# Patient Record
Sex: Male | Born: 1945 | Race: White | Hispanic: No | Marital: Married | Smoking: Former smoker
Health system: Southern US, Community
[De-identification: ages and names within clinical notes are randomized; demographics above are authoritative.]

## PROBLEM LIST (undated history)

## (undated) DIAGNOSIS — F4310 Post-traumatic stress disorder, unspecified: Secondary | ICD-10-CM

## (undated) DIAGNOSIS — F410 Panic disorder [episodic paroxysmal anxiety] without agoraphobia: Secondary | ICD-10-CM

## (undated) DIAGNOSIS — N189 Chronic kidney disease, unspecified: Secondary | ICD-10-CM

## (undated) DIAGNOSIS — F32A Depression, unspecified: Secondary | ICD-10-CM

## (undated) DIAGNOSIS — C4491 Basal cell carcinoma of skin, unspecified: Secondary | ICD-10-CM

## (undated) DIAGNOSIS — M1990 Unspecified osteoarthritis, unspecified site: Secondary | ICD-10-CM

## (undated) DIAGNOSIS — Z87442 Personal history of urinary calculi: Secondary | ICD-10-CM

## (undated) DIAGNOSIS — H903 Sensorineural hearing loss, bilateral: Secondary | ICD-10-CM

## (undated) DIAGNOSIS — R002 Palpitations: Secondary | ICD-10-CM

## (undated) DIAGNOSIS — Z8719 Personal history of other diseases of the digestive system: Secondary | ICD-10-CM

## (undated) DIAGNOSIS — H8111 Benign paroxysmal vertigo, right ear: Secondary | ICD-10-CM

## (undated) DIAGNOSIS — Z8739 Personal history of other diseases of the musculoskeletal system and connective tissue: Secondary | ICD-10-CM

## (undated) DIAGNOSIS — F329 Major depressive disorder, single episode, unspecified: Secondary | ICD-10-CM

## (undated) DIAGNOSIS — G4700 Insomnia, unspecified: Secondary | ICD-10-CM

## (undated) DIAGNOSIS — I10 Essential (primary) hypertension: Secondary | ICD-10-CM

## (undated) DIAGNOSIS — G9689 Other specified disorders of central nervous system: Secondary | ICD-10-CM

## (undated) DIAGNOSIS — Z9289 Personal history of other medical treatment: Secondary | ICD-10-CM

## (undated) DIAGNOSIS — G968 Other specified disorders of central nervous system: Secondary | ICD-10-CM

## (undated) DIAGNOSIS — K219 Gastro-esophageal reflux disease without esophagitis: Secondary | ICD-10-CM

## (undated) DIAGNOSIS — H269 Unspecified cataract: Secondary | ICD-10-CM

## (undated) DIAGNOSIS — C801 Malignant (primary) neoplasm, unspecified: Secondary | ICD-10-CM

## (undated) HISTORY — PX: COSMETIC SURGERY: SHX468

## (undated) HISTORY — DX: Insomnia, unspecified: G47.00

## (undated) HISTORY — PX: TOTAL KNEE ARTHROPLASTY: SHX125

## (undated) HISTORY — DX: Palpitations: R00.2

## (undated) HISTORY — PX: BACK SURGERY: SHX140

## (undated) HISTORY — DX: Gastro-esophageal reflux disease without esophagitis: K21.9

## (undated) HISTORY — PX: OTHER SURGICAL HISTORY: SHX169

## (undated) HISTORY — PX: JOINT REPLACEMENT: SHX530

## (undated) HISTORY — DX: Unspecified cataract: H26.9

## (undated) HISTORY — DX: Other specified disorders of central nervous system: G96.8

## (undated) HISTORY — DX: Post-traumatic stress disorder, unspecified: F43.10

## (undated) HISTORY — PX: TONSILLECTOMY: SUR1361

## (undated) HISTORY — DX: Other specified disorders of central nervous system: G96.89

## (undated) HISTORY — DX: Malignant (primary) neoplasm, unspecified: C80.1

## (undated) HISTORY — DX: Basal cell carcinoma of skin, unspecified: C44.91

## (undated) HISTORY — PX: HERNIA REPAIR: SHX51

---

## 1995-06-23 HISTORY — PX: ESOPHAGOGASTRODUODENOSCOPY: SHX1529

## 1997-10-12 ENCOUNTER — Ambulatory Visit (HOSPITAL_COMMUNITY): Admission: RE | Admit: 1997-10-12 | Discharge: 1997-10-12 | Payer: Self-pay | Admitting: Urology

## 1997-10-20 ENCOUNTER — Ambulatory Visit (HOSPITAL_COMMUNITY): Admission: RE | Admit: 1997-10-20 | Discharge: 1997-10-20 | Payer: Self-pay | Admitting: Urology

## 1998-05-22 HISTORY — PX: NEUROMA SURGERY: SHX722

## 1998-07-05 ENCOUNTER — Ambulatory Visit (HOSPITAL_BASED_OUTPATIENT_CLINIC_OR_DEPARTMENT_OTHER): Admission: RE | Admit: 1998-07-05 | Discharge: 1998-07-05 | Payer: Self-pay | Admitting: Orthopedic Surgery

## 1998-09-24 ENCOUNTER — Encounter: Payer: Self-pay | Admitting: Family Medicine

## 1998-09-24 LAB — CONVERTED CEMR LAB: Blood Glucose, Fasting: 99 mg/dL

## 1998-10-14 ENCOUNTER — Encounter: Payer: Self-pay | Admitting: Family Medicine

## 1998-10-14 LAB — CONVERTED CEMR LAB
RBC count: 5.47 10*6/uL
WBC, blood: 4.3 10*3/uL

## 1999-11-18 ENCOUNTER — Encounter: Payer: Self-pay | Admitting: Family Medicine

## 1999-11-18 LAB — CONVERTED CEMR LAB
Blood Glucose, Fasting: 92 mg/dL
PSA: 2.5 ng/mL
RBC count: 5.79 10*6/uL
WBC, blood: 4.5 10*3/uL

## 2000-10-20 HISTORY — PX: KNEE ARTHROSCOPY: SUR90

## 2000-12-20 ENCOUNTER — Encounter: Payer: Self-pay | Admitting: Family Medicine

## 2001-12-30 ENCOUNTER — Encounter: Payer: Self-pay | Admitting: Family Medicine

## 2002-06-03 ENCOUNTER — Encounter: Payer: Self-pay | Admitting: Family Medicine

## 2002-06-03 LAB — CONVERTED CEMR LAB
RBC count: 5.83 10*6/uL
WBC, blood: 6.8 10*3/uL

## 2003-05-21 ENCOUNTER — Encounter: Payer: Self-pay | Admitting: Family Medicine

## 2003-05-21 LAB — CONVERTED CEMR LAB
Blood Glucose, Fasting: 93 mg/dL
PSA: 0.9 ng/mL
RBC count: 5.34 10*6/uL
TSH: 3.15 microintl units/mL
WBC, blood: 4.2 10*3/uL

## 2004-04-29 ENCOUNTER — Ambulatory Visit: Payer: Self-pay | Admitting: Family Medicine

## 2004-05-04 ENCOUNTER — Encounter: Payer: Self-pay | Admitting: Family Medicine

## 2004-05-19 ENCOUNTER — Ambulatory Visit: Payer: Self-pay | Admitting: Family Medicine

## 2005-09-28 ENCOUNTER — Ambulatory Visit: Payer: Self-pay | Admitting: Family Medicine

## 2006-01-04 ENCOUNTER — Encounter: Payer: Self-pay | Admitting: Family Medicine

## 2006-01-04 LAB — CONVERTED CEMR LAB: PSA: 1.1 ng/mL

## 2006-01-10 ENCOUNTER — Ambulatory Visit: Payer: Self-pay | Admitting: Family Medicine

## 2006-02-05 ENCOUNTER — Ambulatory Visit: Payer: Self-pay | Admitting: Family Medicine

## 2006-04-10 ENCOUNTER — Ambulatory Visit: Payer: Self-pay | Admitting: Family Medicine

## 2006-05-01 ENCOUNTER — Encounter: Payer: Self-pay | Admitting: Family Medicine

## 2006-05-10 ENCOUNTER — Ambulatory Visit: Payer: Self-pay | Admitting: Family Medicine

## 2006-10-10 ENCOUNTER — Telehealth (INDEPENDENT_AMBULATORY_CARE_PROVIDER_SITE_OTHER): Payer: Self-pay | Admitting: *Deleted

## 2007-10-03 ENCOUNTER — Encounter: Payer: Self-pay | Admitting: Family Medicine

## 2007-10-03 ENCOUNTER — Ambulatory Visit: Payer: Self-pay | Admitting: Family Medicine

## 2007-10-03 DIAGNOSIS — I10 Essential (primary) hypertension: Secondary | ICD-10-CM

## 2007-10-03 DIAGNOSIS — K219 Gastro-esophageal reflux disease without esophagitis: Secondary | ICD-10-CM

## 2007-10-03 DIAGNOSIS — C44309 Unspecified malignant neoplasm of skin of other parts of face: Secondary | ICD-10-CM | POA: Insufficient documentation

## 2007-10-03 DIAGNOSIS — M25569 Pain in unspecified knee: Secondary | ICD-10-CM | POA: Insufficient documentation

## 2007-10-03 DIAGNOSIS — C44300 Unspecified malignant neoplasm of skin of unspecified part of face: Secondary | ICD-10-CM | POA: Insufficient documentation

## 2007-10-03 DIAGNOSIS — E781 Pure hyperglyceridemia: Secondary | ICD-10-CM | POA: Insufficient documentation

## 2007-10-21 ENCOUNTER — Encounter: Payer: Self-pay | Admitting: Family Medicine

## 2008-03-03 ENCOUNTER — Telehealth: Payer: Self-pay | Admitting: Family Medicine

## 2008-03-09 LAB — CONVERTED CEMR LAB
BUN: 16 mg/dL
Creatinine, Ser: 1.44 mg/dL

## 2008-03-10 ENCOUNTER — Encounter: Payer: Self-pay | Admitting: Family Medicine

## 2008-04-02 ENCOUNTER — Ambulatory Visit: Payer: Self-pay | Admitting: Family Medicine

## 2008-04-02 DIAGNOSIS — N529 Male erectile dysfunction, unspecified: Secondary | ICD-10-CM | POA: Insufficient documentation

## 2008-04-24 ENCOUNTER — Ambulatory Visit: Payer: Self-pay | Admitting: Family Medicine

## 2008-04-24 LAB — CONVERTED CEMR LAB: OCCULT 2: NEGATIVE

## 2008-04-27 ENCOUNTER — Encounter (INDEPENDENT_AMBULATORY_CARE_PROVIDER_SITE_OTHER): Payer: Self-pay | Admitting: *Deleted

## 2008-10-01 ENCOUNTER — Ambulatory Visit: Payer: Self-pay | Admitting: Family Medicine

## 2008-10-01 DIAGNOSIS — C4490 Unspecified malignant neoplasm of skin, unspecified: Secondary | ICD-10-CM

## 2008-11-02 ENCOUNTER — Encounter: Payer: Self-pay | Admitting: Family Medicine

## 2008-11-02 ENCOUNTER — Ambulatory Visit: Payer: Self-pay | Admitting: Family Medicine

## 2008-11-12 ENCOUNTER — Ambulatory Visit: Payer: Self-pay | Admitting: Family Medicine

## 2009-01-01 ENCOUNTER — Ambulatory Visit: Payer: Self-pay | Admitting: Family Medicine

## 2009-01-01 DIAGNOSIS — M546 Pain in thoracic spine: Secondary | ICD-10-CM

## 2009-01-01 DIAGNOSIS — M545 Low back pain: Secondary | ICD-10-CM | POA: Insufficient documentation

## 2009-01-15 ENCOUNTER — Encounter (INDEPENDENT_AMBULATORY_CARE_PROVIDER_SITE_OTHER): Payer: Self-pay | Admitting: Internal Medicine

## 2009-01-15 ENCOUNTER — Ambulatory Visit: Payer: Self-pay | Admitting: Family Medicine

## 2009-01-21 ENCOUNTER — Encounter (INDEPENDENT_AMBULATORY_CARE_PROVIDER_SITE_OTHER): Payer: Self-pay | Admitting: Internal Medicine

## 2009-06-07 ENCOUNTER — Encounter: Payer: Self-pay | Admitting: Family Medicine

## 2009-06-07 ENCOUNTER — Emergency Department: Payer: Self-pay | Admitting: Internal Medicine

## 2009-06-07 ENCOUNTER — Encounter: Payer: Self-pay | Admitting: Cardiovascular Disease

## 2009-06-07 ENCOUNTER — Telehealth: Payer: Self-pay | Admitting: Family Medicine

## 2009-06-10 ENCOUNTER — Ambulatory Visit: Payer: Self-pay | Admitting: Family Medicine

## 2009-06-10 DIAGNOSIS — R002 Palpitations: Secondary | ICD-10-CM

## 2009-07-15 ENCOUNTER — Ambulatory Visit: Payer: Self-pay | Admitting: Family Medicine

## 2009-07-19 ENCOUNTER — Encounter: Payer: Self-pay | Admitting: Family Medicine

## 2009-07-19 LAB — CONVERTED CEMR LAB: BUN: 21 mg/dL

## 2009-07-21 ENCOUNTER — Telehealth: Payer: Self-pay | Admitting: Family Medicine

## 2009-08-23 ENCOUNTER — Ambulatory Visit: Payer: Self-pay | Admitting: Cardiovascular Disease

## 2009-08-30 ENCOUNTER — Telehealth: Payer: Self-pay | Admitting: Family Medicine

## 2009-08-31 ENCOUNTER — Encounter: Payer: Self-pay | Admitting: Family Medicine

## 2009-09-02 ENCOUNTER — Ambulatory Visit: Payer: Self-pay | Admitting: Family Medicine

## 2009-09-02 ENCOUNTER — Telehealth: Payer: Self-pay | Admitting: Family Medicine

## 2009-09-16 ENCOUNTER — Ambulatory Visit: Payer: Self-pay | Admitting: Family Medicine

## 2009-09-17 ENCOUNTER — Telehealth: Payer: Self-pay | Admitting: Family Medicine

## 2009-09-20 ENCOUNTER — Telehealth: Payer: Self-pay | Admitting: Family Medicine

## 2009-09-27 ENCOUNTER — Ambulatory Visit: Payer: Self-pay | Admitting: Family Medicine

## 2009-09-27 LAB — CONVERTED CEMR LAB
OCCULT 2: NEGATIVE
OCCULT 3: NEGATIVE

## 2009-09-27 LAB — FECAL OCCULT BLOOD, GUAIAC: Fecal Occult Blood: NEGATIVE

## 2009-09-28 ENCOUNTER — Encounter (INDEPENDENT_AMBULATORY_CARE_PROVIDER_SITE_OTHER): Payer: Self-pay | Admitting: *Deleted

## 2009-11-01 ENCOUNTER — Ambulatory Visit: Payer: Self-pay | Admitting: Family Medicine

## 2009-12-28 ENCOUNTER — Encounter (INDEPENDENT_AMBULATORY_CARE_PROVIDER_SITE_OTHER): Payer: Self-pay | Admitting: *Deleted

## 2010-02-24 ENCOUNTER — Ambulatory Visit: Payer: Self-pay | Admitting: Family Medicine

## 2010-02-25 LAB — CONVERTED CEMR LAB
BUN: 14 mg/dL
Creatinine, Ser: 1.6 mg/dL

## 2010-03-02 LAB — CONVERTED CEMR LAB
Chloride: 103 meq/L (ref 96–112)
Creatinine, Ser: 1.6 mg/dL — ABNORMAL HIGH (ref 0.4–1.5)

## 2010-03-17 ENCOUNTER — Ambulatory Visit: Payer: Self-pay | Admitting: Family Medicine

## 2010-03-17 DIAGNOSIS — R7309 Other abnormal glucose: Secondary | ICD-10-CM | POA: Insufficient documentation

## 2010-03-31 ENCOUNTER — Ambulatory Visit: Payer: Self-pay | Admitting: Family Medicine

## 2010-06-02 ENCOUNTER — Ambulatory Visit
Admission: RE | Admit: 2010-06-02 | Discharge: 2010-06-02 | Payer: Self-pay | Source: Home / Self Care | Attending: Family Medicine | Admitting: Family Medicine

## 2010-06-21 NOTE — Progress Notes (Signed)
Summary: heart irregularity and sob  Phone Note Call from Patient   Summary of Call: Patient called and said his heart is beating excessily patient also having shortness of breathe patient advised to go to emergency room patient declined. Spoke with Dr. Milinda Antis she said patient should also go to emergency room. Dee Cma also advised to do the same Initial call taken by: Benny Lennert CMA Duncan Dull),  June 07, 2009 7:52 AM  Follow-up for Phone Call        spoke with patient also and pt states "right now" he doesn't have SOB but it comes and goes, patient is concerned about the irregular heart beat. Heather and I spoke with Dr. Milinda Antis and she also advised that pt go to the ER. Patient stated he would go. DeShannon Langhorne CMA Duncan Dull)  June 07, 2009 7:55 AM   I adv to go to ER if short of breath - otherwise f/u Follow-up by: Judith Part MD,  June 07, 2009 8:39 AM

## 2010-06-21 NOTE — Progress Notes (Signed)
Summary: regarding verapamil refill  Phone Note Refill Request Message from:  Fax from Pharmacy  Refills Requested: Medication #1:  VERAPAMIL HCL 80 MG TABS 1 by mouth three times a day Refill request from [facility].  Request is for 120 mg, one three times a day.  Chart says he takes 90 mg's three times a day, please advise on what he should be taking.  Initial call taken by: Lowella Petties CMA,  September 17, 2009 11:40 AM  Follow-up for Phone Call        Cardiology increased his dose. Follow-up by: Shaune Leeks MD,  Sep 20, 2009 7:25 AM    New/Updated Medications: VERAPAMIL HCL 120 MG TABS (VERAPAMIL HCL) one tab by mouth three times a day Prescriptions: VERAPAMIL HCL 120 MG TABS (VERAPAMIL HCL) one tab by mouth three times a day  #90 x 12   Entered and Authorized by:   Shaune Leeks MD   Signed by:   Shaune Leeks MD on 09/20/2009   Method used:   Electronically to        [facility] [phone]* (retail)       [address] [place], [place]  [address]       Ph: [phone]       Fax: [phone]   RxID:   [id number]

## 2010-06-21 NOTE — Letter (Signed)
Summary: Results Follow up Letter  [place] at [facility]  [address] [place], [place] [address]   Phone: [phone]  Fax: [phone]    09/28/2009 MRN: [id number]  THERIN VETSCH [address] [place], [place]  [address]  Dear Mr. Stillion,  The following are the results of your recent test(s):  Test         Result    Pap Smear:        Normal _____  Not Normal _____ Comments: ______________________________________________________ Cholesterol: LDL(Bad cholesterol):         Your goal is less than:         HDL (Good cholesterol):       Your goal is more than: Comments:  ______________________________________________________ Mammogram:        Normal _____  Not Normal _____ Comments:  ___________________________________________________________________ Hemoccult:        Normal _X____  Not normal _______ Comments:  Please repeat in one year.  _____________________________________________________________________ Other Tests:    We routinely do not discuss normal results over the telephone.  If you desire a copy of the results, or you have any questions about this information we can discuss them at your next office visit.   Sincerely,     Laurita Quint, MD

## 2010-06-21 NOTE — Letter (Signed)
Summary: Nadara Eaton letter  [place] at [facility]  [address] [place], [place] [address]   Phone: [phone]  Fax: [phone]       12/28/2009 MRN: [id number]  LUNDEN MCLEISH [address] [place], [place]  [address]  Dear Mr. [facility] [place] [place] announce the retirement of Arta Silence, M.D., from full-time practice at the [facility] office effective November 18, 2009 and his plans of returning part-time.  It is important to Dr. Hetty Ely and to our practice that you understand that [facility] has seven physicians in our office for your health care needs.  We will continue to offer the same exceptional care that you have today.    Dr. Hetty Ely has spoken to many of you about his plans for retirement and returning part-time in the fall.   We will continue to work with you through the transition to schedule appointments for you in the office and meet the high standards that [facility] is committed to.   Again, it is with great pleasure that we share the news that Dr. Hetty Ely will return to [facility] in October of 2011 with a reduced schedule.    If you have any questions, or would like to request an appointment with one of our physicians, please call us at [phone] and press the option for Scheduling an appointment.  We take pleasure in providing you with excellent patient care and look forward to seeing you at your next office visit.  Our [facility] Physicians are:  Tillman Abide, M.D. Laurita Quint, M.D. Roxy Manns, M.D. Kerby Nora, M.D. Hannah Beat, M.D. Ruthe Mannan, M.D. We proudly welcomed Raechel Ache, M.D. and Eustaquio Boyden, M.D. to the practice in July/August 2011.  Sincerely,  [facility]

## 2010-06-21 NOTE — Progress Notes (Signed)
Summary: need rx for blood work  Phone Note Call from Patient Call back at [phone]   Caller: Patient Call For: Shaune Leeks MD Summary of Call: Patient has a cpx with you on Thursday, he needs a rx for blood work to take to [facility]. Please call patient when ready.  Initial call taken by: Melody Comas,  August 30, 2009 9:29 AM  Follow-up for Phone Call        Done. Follow-up by: Shaune Leeks MD,  August 30, 2009 3:59 PM  Additional Follow-up for Phone Call Additional follow up Details #1::        Patient notified that rx for lab work is ready for pick up.  Additional Follow-up by: Melody Comas,  August 30, 2009 4:10 PM

## 2010-06-21 NOTE — Miscellaneous (Signed)
Summary: CONSENT TO SPECIAL PROCEDURE  CONSENT TO SPECIAL PROCEDURE   Imported By: Carin Primrose 09/17/2009 15:31:07  _____________________________________________________________________  External Attachment:    Type:   Image     Comment:   External Document

## 2010-06-21 NOTE — Progress Notes (Signed)
Summary: PHI  PHI   Imported By: Harlon Flor 08/24/2009 08:59:22  _____________________________________________________________________  External Attachment:    Type:   Image     Comment:   External Document

## 2010-06-21 NOTE — Assessment & Plan Note (Signed)
Summary: CPX.David KitchenMarland KitchenLIFE TIME CPX PER MEDICARE CYD   Vital Signs:  Patient profile:   65 year old male Weight:      246.25 pounds Temp:     98.6 degrees F oral Pulse rate:   60 / minute Pulse rhythm:   regular BP sitting:   144 / 88  (left arm) Cuff size:   large  Vitals Entered By: Sydell Axon LPN (09/23/09 11:03 AM) CC: 30 Minute checkup, hemoccult cards given to patient   History of Present Illness: Pt here for Comp Exam. He has been seen for palpitations which are better since increasing Verapamil. He has significant problems with his knees and legs and can't exercise.  The area of his left flank has been hurting for over a month. He also has left sided suprapubic area discomfort, some milder right sided discomfort. No pain with urination.  Preventive Screening-Counseling & Management  Alcohol-Tobacco     Smoking Status: quit     Year Quit: 1971     Pack years: 25     Passive Smoke Exposure: no  Caffeine-Diet-Exercise     Caffeine use/day: 1     Does Patient Exercise: no  Problems Prior to Update: 1)  Palpitations  (ICD-785.1) 2)  Back Pain, Thoracic Region, Left  (ICD-724.1) 3)  Low Back Pain, Acute  (ICD-724.2) 4)  Carcinoma, Basal Cell  (ICD-173.9) 5)  Special Screening Malig Neoplasms Other Sites  (ICD-V76.49) 6)  Health Maintenance Exam  (ICD-V70.0) 7)  Organic Impotence  (ICD-607.84) 8)  Basal Cell Carcinoma, Nose  (ICD-173.3) 9)  Hypertriglyceridemia  (ICD-272.1) 10)  Gerd  (ICD-530.81) 11)  Hypertension, Essential  (ICD-401.9) 12)  Knee Pain, Right  (ICD-719.46)  Allergies: 1)  ! * Zithromax  Past History:  Past Surgical History: Last updated: 04/02/2008 R HERNOIORRAPHY YEARS AGO R FOOT MORTON'S NEUROMA EXCISION ABD. ULTRASOUND : NO A.A.A.// RIGHT CYST OF KIDNEY// LEFT KIDNEY SMALL:(11/10/1998) R KNEE ARTHROSCOPY (DR MILLER)(10/2000) ABD. ULTRASOUND : NORMAL :(06/06/2002) EGD-- STRETCHING (DR. ELLIOTT) :(1990"S) EGD DILITATION BX  NEGATIVE:(DR.SEIGAL) :(06/1995) RIGHT KNEE ARTHOSCOPY: (10/2000)  Family History: Last updated: 2009/09/23 Father: DECEASED AT 68 YOA : + HTN, +KIDNEY FAILURE Mother: DECEASED AT 16 YOA +HTN; +DM BROTHER A 75 (:RON Derasmo ) DM HTN CHOL ABD Surgery SISTER A 89  DM Obese CABG x 4 CV: NEGATIVE HBP: + SELF; +MOTHER: + FATHER, + BROTHER DM: + MOTHER; + BROTHER GOUT/ARTHRITIS:  PROSTATE CANCER:--- SKIN CANCER: +SELF BREAST/OVARIAN/UTERINE CANCER: COLON CANCER: DEPRESSION: + ? 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 BROTHER ETOH/DRUG ABUSE: NEGATIVE OTHER ;+STROKE BROTHER  Social History: Reviewed history from 10/03/2007 and no changes required. 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 Eyes:  Denies blurring, discharge, eye pain, and itching. ENT:  Denies decreased hearing, ear discharge, and earache. CV:  Complains of palpitations; denies chest pain or discomfort, fainting, fatigue, shortness of breath with exertion, and swelling of feet;  none recently.David Mcgee Resp:  Denies cough, shortness of breath, and wheezing. GI:  Denies abdominal pain, bloody stools, change in bowel habits, constipation, dark tarry stools, diarrhea, indigestion, loss of appetite, nausea, vomiting, vomiting blood, and yellowish skin color. GU:  Denies discharge, dysuria, hematuria, nocturia, and urinary frequency. MS:  Complains of joint pain, low back pain, and muscle aches; denies stiffness. Derm:  Denies dryness, itching, and rash. Neuro:  Denies numbness, poor balance, tingling, and tremors.  Physical Exam  General:  alert, well-developed, well-nourished, and well-hydrated.  NAD Head:  normocephalic, atraumatic, and no abnormalities observed.  Sinuses NT. Eyes:  Conjunctiva clear bilaterally.  Ears:  External ear exam shows no significant lesions or deformities.  Otoscopic examination reveals clear canals, tympanic membranes are intact bilaterally without bulging, retraction, inflammation or discharge. Hearing is grossly normal bilaterally. Nose:  External nasal examination shows no deformity or inflammation. Nasal mucosa are pink and moist without lesions or exudates. Mouth:  Oral mucosa and oropharynx without lesions or exudates.  Teeth in good repair. Neck:  No deformities, masses, or tenderness noted. Chest Wall:  No deformities, masses, tenderness or gynecomastia noted. Breasts:  No masses or gynecomastia noted Lungs:  normal respiratory effort, no intercostal retractions, no accessory muscle use, and normal breath sounds.   Heart:  normal slow heartrate with  regular rhythm, and no murmur.   Abdomen:  Bowel sounds positive,abdomen soft and non-tender without masses, organomegaly or hernias noted. Rectal:  No external abnormalities noted. Normal sphincter tone. No rectal masses or tenderness. G neg. Genitalia:  Testes bilaterally descended without nodularity, tenderness or masses. No scrotal masses or lesions. No penis lesions or urethral  discharge. Prostate:  Prostate gland firm and smooth, no enlargement, nodularity, tenderness, mass, asymmetry or induration. 20-30gms Msk:  No deformity or scoliosis noted of thoracic or lumbar spine.  Area of tenderness unable to reproduce just under left floating rib in post lat clav line, no echymosis or swelling noted. Pulses:  R and L carotid,radial,femoral,dorsalis pedis and posterior tibial pulses are full and equal bilaterally Extremities:  No clubbing, cyanosis, edema, or deformity noted with normal full range of motion of all joints.   Neurologic:  No cranial nerve deficits noted. Station and gait are normal. Plantar reflexes are down-going bilaterally. DTRs are symmetrical throughout. Sensory, motor and coordinative functions appear intact. Skin:  Intact without suspicious lesions or rashes Cervical Nodes:  No lymphadenopathy noted Inguinal Nodes:  No significant adenopathy Psych:  Cognition and judgment appear intact. Alert and cooperative with normal attention span and concentration. No apparent delusions, illusions, hallucinations   Impression & Recommendations:  Problem # 1:  HEALTH MAINTENANCE EXAM (ICD-V70.0)  Reviewed preventive care protocols, scheduled due services, and updated immunizations. Discussed Zostavax. He'll consider. Stool cards given.  Problem # 2:  PALPITATIONS (ICD-785.1) Assessment: Improved  Cont Verapamil. Holding him well. His updated medication list for this problem includes:    Bystolic 5 Mg Tabs (Nebivolol hcl) .David Mcgee... 1 tab daily as needed for palpitations  Avoid caffeine, chocolate, and stimulants. Stress reduction as well as medication options discussed.   Problem # 3:  BACK PAIN, THORACIC REGION, LEFT (ICD-724.1) Just under left floating twelfth rib. Assume muscular. Treat with Musc relaxer if so desired.  Use heat and gentle stretches and call if no improvement.. His updated medication list for this problem includes:    Ibuprofen 800 Mg Tabs  (Ibuprofen) ..... One tab after brfst, lunch and dinner    Tylenol 325 Mg Tabs (Acetaminophen) ..... Otc as directed.  Problem # 4:  ORGANIC IMPOTENCE (ICD-607.84) Assessment: Unchanged  Script sent in. His updated medication list for this problem includes:    Levitra 20 Mg Tabs (Vardenafil hcl) ..... One pill one hour prior  Discussed proper use of medications, as well as side effects.   Orders: Prescription Created Electronically [phone])  Problem # 5:  HYPERTRIGLYCERIDEMIA (ICD-272.1) Assessment: Unchanged Needs chol Profile done. Needs script to do that. Only did total chol this time..Your physician has requested that you have the following labwork done today: lab error at [facility].  Problem # 6:  HYPERTENSION, ESSENTIAL (ICD-401.9) Assessment: Unchanged Will follow for now. His updated medication list for this problem includes:    Verapamil Hcl 80 Mg Tabs (Verapamil hcl) .David Mcgee... 1 by mouth three times a day    Bystolic 5 Mg Tabs (Nebivolol hcl) .David Mcgee... 1 tab daily as needed for palpitations  BP today: 144/88 Prior BP: 168/88 (08/23/2009)  Problem # 7:  KNEE PAIN, RIGHT (ICD-719.46) Assessment: Unchanged Knee pain coontinues...no better, no worse. Follow. His updated medication list for this problem includes:    Ibuprofen 800 Mg Tabs (Ibuprofen) ..... One tab after brfst, lunch and dinner    Tylenol 325 Mg Tabs (Acetaminophen) ..... Otc as directed.  Complete Medication List: 1)  Verapamil Hcl 80 Mg Tabs (Verapamil hcl) .David Mcgee.. 1 by mouth three times a day 2)  Prilosec 20 Mg Cpdr (Omeprazole) .David Mcgee.. 1 tablet twice a day are as needed  by mouth 3)  Ibuprofen 800 Mg Tabs (Ibuprofen) .... One tab after brfst, lunch and dinner 4)  Levitra 20 Mg Tabs (Vardenafil hcl) .... One pill one hour prior 5)  Tylenol 325 Mg Tabs (Acetaminophen) .... Otc as directed. 6)  Bystolic 5 Mg Tabs (Nebivolol hcl) .David Mcgee.. 1 tab daily as needed for palpitations  Patient Instructions: 1)  RTC if back keep  bothering him. 2)  Need lipid panel done to appropriately assess Chol. Prescriptions: LEVITRA 20 MG TABS (VARDENAFIL HCL) one pill one hour prior  #10 x 12   Entered and Authorized by:   Shaune Leeks MD   Signed by:   Shaune Leeks MD on 09/02/2009   Method used:   Electronically to        [facility] [phone]* (retail)       [address] [place], [place]  [address]       Ph: [phone]       Fax: [phone]   RxID:   [id number]   Current Allergies (reviewed today): ! [facility]

## 2010-06-21 NOTE — Assessment & Plan Note (Signed)
Summary: follow up   Vital Signs:  Patient profile:   65 year old male Weight:      246 pounds Temp:     98.8 degrees F oral Pulse rate:   64 / minute Pulse rhythm:   regular BP sitting:   106 / 70  (left arm) Cuff size:   large  Vitals Entered By: Sydell Axon LPN (July 15, 2009 2:51 PM) CC: follow-up visit   History of Present Illness: Pt here for followup of palpitations. He had  ER visit at [facility] for palpitations and very strong heartbeat. This started per the ER notes when he was doing heavy yard work a few days before being seen. He was monitored, had bloodwork including one round of enzymes which were normal, and n EKG that was unremarkable. The discharge note says to wear a Holter monitor that was not placed. He has had no similar sxs since but has chroinic mild palpitations for years. He has learned to calm himself and these usually improve and resolve on their own. He has had some instances of this since being seen here a few wekks ago but he has had palpitations fopr so long in his leftime that he can't tell what is normal and abnormal anymore. While monitored in the hosp he "pushed the button" multiple times, presumably without abnormality. He has had nomother associated sxs.  Problems Prior to Update: 1)  Palpitations  (ICD-785.1) 2)  Back Pain, Thoracic Region, Left  (ICD-724.1) 3)  Low Back Pain, Acute  (ICD-724.2) 4)  Carcinoma, Basal Cell  (ICD-173.9) 5)  Special Screening Malig Neoplasms Other Sites  (ICD-V76.49) 6)  Health Maintenance Exam  (ICD-V70.0) 7)  Organic Impotence  (ICD-607.84) 8)  Basal Cell Carcinoma, Nose  (ICD-173.3) 9)  Hypertriglyceridemia  (ICD-272.1) 10)  Gerd  (ICD-530.81) 11)  Hypertension, Essential  (ICD-401.9) 12)  Knee Pain, Right  (ICD-719.46)  Medications Prior to Update: 1)  Prinzide 20-12.5 Mg Tabs (Lisinopril-Hydrochlorothiazide) .... One Tab By Mouth Every Morning 2)  Verapamil Hcl 120 Mg Tabs (Verapamil Hcl) .Marland Kitchen.. 1 Tablet Twice  Daily 3)  Prilosec 20 Mg  Cpdr (Omeprazole) .Marland Kitchen.. 1 Tablet Twice A Day Are As Needed  By Mouth 4)  Ibuprofen 800 Mg  Tabs (Ibuprofen) .... One Tab After Brfst, Lunch and Dinner 5)  Levitra 20 Mg Tabs (Vardenafil Hcl) .... One Pill One Hour Prior 6)  Tylenol 325 Mg Tabs (Acetaminophen) .... Otc As Directed.  Allergies: 1)  ! * Zithromax  Physical Exam  General:  alert, well-developed, well-nourished, and well-hydrated.  NAD Head:  normocephalic, atraumatic, and no abnormalities observed.  Sinuses NT. Eyes:  Conjunctiva clear bilaterally.  Ears:  External ear exam shows no significant lesions or deformities.  Otoscopic examination reveals clear canals, tympanic membranes are intact bilaterally without bulging, retraction, inflammation or discharge. Hearing is grossly normal bilaterally. Nose:  External nasal examination shows no deformity or inflammation. Nasal mucosa are pink and moist without lesions or exudates. Mouth:  Oral mucosa and oropharynx without lesions or exudates.  Teeth in good repair. Neck:  No deformities, masses, or tenderness noted. Chest Wall:  No deformities, masses, tenderness or gynecomastia noted. Lungs:  normal respiratory effort, no intercostal retractions, no accessory muscle use, and normal breath sounds.   Heart:  normal slow heartrate with  regular rhythm, and no murmur.   Abdomen:  Bowel sounds positive,abdomen soft and non-tender without masses, organomegaly or hernias noted. Psych:  Cognition and judgment appear intact. Alert and cooperative with normal  attention span and concentration. No apparent delusions, illusions, hallucinations, good eye contact and very appropriate.   Impression & Recommendations:  Problem # 1:  PALPITATIONS (ICD-785.1) Assessment Unchanged Will refer to Dr Graciela Husbands rather than do event monitor. He can better tease the important information to hopefully arrive at diagnosis. Orders: Cardiology Referral (Cardiology)  Complete  Medication List: 1)  Prinzide 20-12.5 Mg Tabs (Lisinopril-hydrochlorothiazide) .... One tab by mouth every morning 2)  Verapamil Hcl 120 Mg Tabs (Verapamil hcl) .Marland Kitchen.. 1 tablet twice daily 3)  Prilosec 20 Mg Cpdr (Omeprazole) .Marland Kitchen.. 1 tablet twice a day are as needed  by mouth 4)  Ibuprofen 800 Mg Tabs (Ibuprofen) .... One tab after brfst, lunch and dinner 5)  Levitra 20 Mg Tabs (Vardenafil hcl) .... One pill one hour prior 6)  Tylenol 325 Mg Tabs (Acetaminophen) .... Otc as directed.  Other Orders: Venipuncture (29528)  Patient Instructions: 1)  Refer to Dr Graciela Husbands next month  Current Allergies (reviewed today): ! * [facility]

## 2010-06-21 NOTE — Progress Notes (Signed)
Summary: ? About Medication  Phone Note Outgoing Call   Call placed by: Sydell Axon, LPN Call placed to: Patient Summary of Call: Called patient with lab results and to make medication changes. Was informed by patient that he is already taking Verapamil two times a day and has been for years. Please advise.  Initial call taken by: Sydell Axon LPN,  July 21, 2009 9:46 AM  Follow-up for Phone Call        Not quite two years to be exact. Incr to three times a day . Sorry my fault. If becomes difficult to take three times a day, will change dose. Will at least discuss at April visit. Follow-up by: Shaune Leeks MD,  July 21, 2009 1:35 PM  Additional Follow-up for Phone Call Additional follow up Details #1::        Patient notified as instructed by telephone Additional Follow-up by: Sydell Axon LPN,  July 21, 2009 2:54 PM    New/Updated Medications: VERAPAMIL HCL 120 MG TABS (VERAPAMIL HCL) 1 tablet three times a day

## 2010-06-21 NOTE — Assessment & Plan Note (Signed)
Summary: F/U [facility] ER ON 06/07/09/CLE   Vital Signs:  Patient profile:   65 year old male Weight:      250.75 pounds Temp:     97.9 degrees F oral Pulse rate:   68 / minute Pulse rhythm:   regular BP sitting:   118 / 70  (left arm) Cuff size:   large  Vitals Entered By: Sydell Axon LPN (June 10, 2009 2:48 PM) CC: F/U from [facility]   History of Present Illness: Pt here for followup from ER visit at [facility] for palpitations and very strong heartbeat. This started per the ER notes when he was doing heavy yard work a few days before being seen. He was monitored, had bloodwork including one round of enzymes which were normal, and n EKG that was unremarkable. The discharge note says to wear a Holter monitor that was not placed. He has had no similar sxs since but has chroinic mild palpitations for years. He has learned to calm himself and these usually improve and resolve on their own.  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 One Tab By Mouth Every Morning 2)  Verapamil Hcl 120 Mg Tabs (Verapamil Hcl) .Marland Kitchen.. 1 Tablet Twice Daily 3)  Prilosec 20 Mg  Cpdr (Omeprazole) .Marland Kitchen.. 1 Tablet Twice A Day Are As Needed  By Mouth 4)  Ibuprofen 800 Mg  Tabs (Ibuprofen) .... One Tab After Brfst, Lunch and Dinner 5)  Levitra 20 Mg Tabs (Vardenafil Hcl) .... One Pill One Hour Prior 6)  Tylenol 325 Mg Tabs (Acetaminophen) .... Otc As Directed. 7)  Tramadol Hcl 50 Mg Tabs (Tramadol Hcl)  .Marland Kitchen.. 1 Every 6 Hrs As Needed Pain.  May Take Tylenol Es With As Needed  Allergies: 1)  ! * Zithromax  Physical Exam  General:  alert, well-developed, well-nourished, and well-hydrated.  NAD Head:  normocephalic, atraumatic, and no abnormalities observed.  Sinuses NT. Ears:  External ear exam shows no significant lesions or deformities.  Otoscopic examination reveals clear canals, tympanic membranes are intact bilaterally without bulging, retraction, inflammation or discharge. Hearing is grossly normal bilaterally. Nose:  External nasal examination shows no deformity or inflammation. Nasal mucosa are pink and moist without lesions or exudates. Mouth:  Oral mucosa and oropharynx without lesions or exudates.  Teeth in good repair. Neck:  No deformities, masses, or tenderness noted. Lungs:  normal respiratory effort, no intercostal retractions, no accessory muscle use, and normal breath sounds.   Heart:  normal rate, regular rhythm, and no murmur.     Impression & Recommendations:  Problem # 1:  PALPITATIONS (ICD-785.1) Assessment New  New intensity of rapid heartbeat, unusual for him from his usual episodic palpitations he has had for years. Discussed being seen at onset of sxs to try to capture heartbeat and any irregularity. Due to unpredictable rare nature of sxs, do not think event monitor would be revealing but if sxs recur within the month, willl try placing monitor.  No EKG today as auscultation very  nml. Again emphasized coming in for recurrence and otherwise will see back in 4 weeks to discuss. Heartrate in ER and here in mid to high 60s.  Avoid caffeine, chocolate, and stimulants. Stress reduction as well as medication options discussed.   Problem # 2:  HYPERTENSION, ESSENTIAL (ICD-401.9) Assessment: Unchanged Stable. His updated medication list for this problem includes:    Prinzide 20-12.5 Mg Tabs (Lisinopril-hydrochlorothiazide) ..... One tab by mouth every morning     Verapamil Hcl 120 Mg Tabs (Verapamil hcl) .Marland Kitchen... 1 tablet twice daily  BP today: 118/70 Prior BP: 120/80 (01/15/2009)  Complete Medication List: 1)  Prinzide 20-12.5 Mg Tabs (Lisinopril-hydrochlorothiazide) .... One tab by mouth every morning 2)  Verapamil Hcl 120 Mg Tabs (Verapamil hcl) .Marland Kitchen.. 1 tablet twice daily 3)  Prilosec 20 Mg Cpdr (Omeprazole) .Marland Kitchen.. 1 tablet twice a day are as needed  by mouth 4)  Ibuprofen 800 Mg Tabs (Ibuprofen) .... One tab after brfst, lunch and dinner 5)  Levitra 20 Mg Tabs (Vardenafil hcl) .... One pill one hour prior 6)  Tylenol 325 Mg Tabs (Acetaminophen) .... Otc as directed.  Patient Instructions: 1)  RTC one month. 2)  30 mins spent with pt.  Current Allergies (reviewed today): ! * ZITHROMAX  Appended Document: F/U [facility] ER ON 06/07/09/CLE Will repeat labs next time to recheck elevated BUN (1.5) and glucose (113). Will also recheck minimally elevated TSH (3.98) when seen.

## 2010-06-21 NOTE — Assessment & Plan Note (Signed)
Summary: NP6/AMD   Visit Type:  NP Referring Provider:  Laurita Mcgee Primary Provider:  Shaune Leeks MD  CC:  irregular heart beat, no cp, some short of breath, and some edema in hands occasionally feet. Marland Kitchen  History of Present Illness: David Mcgee is a very pleasant 65 year old gentleman, patient of Dr. Hetty Mcgee, with a history of arthritis, hypertension, and recent palpitations who presents for evaluation of tachycardia palpitations.  [name] states that he has had palpitations since the early 1980s. On January 16 of this year, he had severe palpitations. He described it as strong, irregular rhythms that lasted all night long. It was not fast though it did make him very anxious. He saw Dr. Hetty Mcgee in followup and was referred to the emergency room. There he states he had a chest x-ray and routine blood work which was essentially normal.   He stated that he did have a 24-hour Holter through [facility]. evaluation of this Holter which has not been read yet, shows frequent PVCs and rare APCs.  His verapamil was increased to 80 mg 3 times a day and since that time he has had no further episodes of prolonged tachypalpitations. He has had one or 2 extra beats though his symptoms have been much better. He also states that he was taken off his lisinopril/HCTZ. His blood pressure at home has been well controlled with systolic of 120, diastolic 70-80.  he is otherwise very active and has no complaints. No shortness of breath or chest pain with exertion. He is limited by his arthritis in his knees and back and takes ibuprofen 800 mg 3 times a day p.r.n.  Current Problems (verified): 1)  Palpitations  (ICD-785.1) 2)  Back Pain, Thoracic Region, Left  (ICD-724.1) 3)  Low Back Pain, Acute  (ICD-724.2) 4)  Carcinoma, Basal Cell  (ICD-173.9) 5)  Special Screening Malig Neoplasms Other Sites  (ICD-V76.49) 6)  Health Maintenance Exam  (ICD-V70.0) 7)  Organic Impotence  (ICD-607.84) 8)  Basal Cell  Carcinoma, Nose  (ICD-173.3) 9)  Hypertriglyceridemia  (ICD-272.1) 10)  Gerd  (ICD-530.81) 11)  Hypertension, Essential  (ICD-401.9) 12)  Knee Pain, Right  (ICD-719.46)  Current Medications (verified): 1)  Verapamil Hcl 80 Mg Tabs (Verapamil Hcl) .Marland Kitchen.. 1 By Mouth Three Times A Day 2)  Prilosec 20 Mg  Cpdr (Omeprazole) .Marland Kitchen.. 1 Tablet Twice A Day Are As Needed  By Mouth 3)  Ibuprofen 800 Mg  Tabs (Ibuprofen) .... One Tab After Brfst, Lunch and Dinner 4)  Levitra 20 Mg Tabs (Vardenafil Hcl) .... One Pill One Hour Prior 5)  Tylenol 325 Mg Tabs (Acetaminophen) .... Otc As Directed.  Allergies (verified): 1)  ! * Zithromax  Past History:  Past Surgical History: Last updated: 2008/04/30 R HERNOIORRAPHY YEARS AGO R FOOT MORTON'S NEUROMA EXCISION ABD. ULTRASOUND : NO A.A.A.// RIGHT CYST OF KIDNEY// LEFT KIDNEY SMALL:(11/10/1998) R KNEE ARTHROSCOPY (DR MILLER)(10/2000) ABD. ULTRASOUND : NORMAL :(06/06/2002) EGD-- STRETCHING (DR. ELLIOTT) :(1990"S) EGD DILITATION BX NEGATIVE:(DR.SEIGAL) :(06/1995) RIGHT KNEE ARTHOSCOPY: (10/2000)  Family History: Last updated: 04/30/2008 Father: DECEASED AT 89 YOA : + HTN, +KIDNEY FAILURE Mother: DECEASED AT 49 YOA +HTN; +DM BROTHER A 67 (:RON Wendling ) DM HTN CHOL ABD Surgery SISTER A 88 DM Obese CABG x 4 CV: NEGATIVE HBP: + SELF; +MOTHER: + FATHER, + BROTHER DM: + MOTHER; + BROTHER GOUT/ARTHRITIS:  PROSTATE CANCER:--- SKIN CANCER: +SELF BREAST/OVARIAN/UTERINE CANCER: COLON CANCER: DEPRESSION: + ? BROTHER ETOH/DRUG ABUSE: NEGATIVE OTHER ;+STROKE BROTHER  Social History: Last updated: 10/03/2007 Marital  Status: Married LIVES WITH WIFE Children: 1 CHILD Occupation: RETIRED [employer] NOW [employer]S [employer]// [employer] AT [employer]  Risk Factors: Caffeine Use: 1 (04/02/2008) Exercise: no (04/02/2008)  Risk Factors: Smoking Status: quit (04/02/2008) Passive Smoke Exposure: no (04/02/2008)  Review of Systems  The patient denies fever,  weight loss, weight gain, vision loss, decreased hearing, hoarseness, chest pain, syncope, dyspnea on exertion, peripheral edema, prolonged cough, abdominal pain, incontinence, muscle weakness, depression, and enlarged lymph nodes.         Palpitations  Vital Signs:  Patient profile:   65 year old male Height:      74.50 inches Weight:      246.50 pounds BMI:     31.34 Pulse rate:   60 / minute Pulse rhythm:   regular BP sitting:   168 / 88  (left arm) Cuff size:   large  Vitals Entered By: Mercer Pod (August 23, 2009 10:37 AM)  Physical Exam  General:  well-appearing middle-aged gentleman in no apparent distress, HEENT exam is benign, oropharynx is clear, neck is supple with no JVP or carotid bruits, heart sounds are regular with S1-S2 and no murmurs appreciated, lungs are clear to auscultation with no wheezes or rales, abdominal exam is benign, no significant lower extremity edema, neurologic exam is grossly nonfocal, skin is warm and dry. Pulses are equal and symmetrical in his upper and lower extremities.    EKG  Procedure date:  06/07/2009  Findings:      EKG shows normal sinus rhythm with rate 68 beats per minute, no significant ST or T wave changes.  Impression & Recommendations:  Problem # 1:  PALPITATIONS (ICD-785.1) his Holter monitor on January 17 shows frequent PVCs and some APCs. No other arrhythmia noted. This can be managed medically with his verapamil 3 times a day. We have given him Bystolic 5 mg to be taken p.r.n. for days when he had significant palpitations.  I do not think that he needs an event monitor at this time as we have documented his arrhythmia. He states that he is due to have a cholesterol in the near future and I have asked him to forward this to our attention as soon as it has been completed. He states that his cholesterol typically is elevated.  The following medications were removed from the medication list:    Prinzide 20-12.5 Mg Tabs  (Lisinopril-hydrochlorothiazide) ..... One tab by mouth every morning His updated medication list for this problem includes:    Verapamil Hcl 80 Mg Tabs (Verapamil hcl) .Marland Kitchen... 1 by mouth three times a day    Bystolic 5 Mg Tabs (Nebivolol hcl) .Marland Kitchen... 1 tab daily as needed for palpitations  Problem # 2:  HEALTH MAINTENANCE EXAM (ICD-V70.0) we will look at his cholesterol as soon as it is available. We also did suggest to him that he could try to take any aspirin 81 mg daily, coated as he is over age 1.  Patient Instructions: 1)  Your physician recommends that you schedule a follow-up appointment in: 1 year 2)  Your physician has recommended you make the following change in your medication: bystolic 5 mg as needed

## 2010-06-21 NOTE — Progress Notes (Signed)
Summary: refill request for lisinopril/ hctz  Phone Note Refill Request Message from:  Fax from Pharmacy  Refills Requested: Medication #1:  lisinopril/ hctz 20/12.5 Faxed request from [facility].  Pt told them he is now taking 2 a day but this is no longer on med list.  Please advise.  Initial call taken by: Lowella Petties CMA,  Sep 20, 2009 10:43 AM  Follow-up for Phone Call        [facility] to call back. Shaune Leeks MD  Sep 20, 2009 1:08 PM  Finally spoke with wife. She will have him call on Mon. He was supposed to have been stopped on Lisinopril/Hctz and have started Bystolic. She only knew of him taking Verapamil. Shaune Leeks MD  Sep 25, 2009 9:50 AM  Discussed w/ pt this AM. He is off this med. Follow-up by: Shaune Leeks MD,  Sep 27, 2009 3:09 PM

## 2010-06-21 NOTE — Assessment & Plan Note (Signed)
Summary: 1 MONTH FOLLOW UP/[facility]   Vital Signs:  Patient profile:   65 year old male Weight:      241 pounds Temp:     98.4 degrees F oral Pulse rate:   64 / minute Pulse rhythm:   regular BP sitting:   144 / 88  (left arm) Cuff size:   large  Vitals Entered By: Sydell Axon LPN (November 01, 2009 10:27 AM) CC: One month follow-up on BP   History of Present Illness: Pt here for followup on his BP. He does not salt anything. He has had some renal insufficiency and we stopped his Lisinopril. His pressures have been elevated since. He feels well with no complaints today. He was told to take Bystolic if his pulse started running away with him again.  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 One Tab By Mouth Three Times A Day 2)  Prilosec 20 Mg  Cpdr (Omeprazole) .Marland Kitchen.. 1 Tablet Twice A Day Are As Needed  By Mouth 3)  Levitra 20 Mg Tabs (Vardenafil Hcl) .... One Pill One Hour Prior 4)  Tylenol 325 Mg Tabs (Acetaminophen) .... Otc As Directed. 5)  Bystolic 5 Mg Tabs (Nebivolol Hcl) .Marland Kitchen.. 1 Tab Daily As Needed For Palpitations  Allergies: 1)  ! * Zithromax  Physical Exam  General:  alert, well-developed, well-nourished, and well-hydrated.  NAD Head:  normocephalic, atraumatic, and no abnormalities observed.  Sinuses NT. Eyes:  Conjunctiva clear bilaterally.  Ears:  External ear exam shows no significant lesions or deformities.  Otoscopic  examination reveals clear canals, tympanic membranes are intact bilaterally without bulging, retraction, inflammation or discharge. Hearing is grossly normal bilaterally. Nose:  External nasal examination shows no deformity or inflammation. Nasal mucosa are pink and moist without lesions or exudates. Mouth:  Oral mucosa and oropharynx without lesions or exudates.  Teeth in good repair. Neck:  No deformities, masses, or tenderness noted. Chest Wall:  No deformities, masses, tenderness or gynecomastia noted. Lungs:  normal respiratory effort, no intercostal retractions, no accessory muscle use, and normal breath sounds.   Heart:  normal slow heartrate with  regular rhythm, and no murmur.     Impression & Recommendations:  Problem # 1:  HYPERTENSION, ESSENTIAL (ICD-401.9) Assessment Improved Butnstill elevated. He has a difficult time remembering his midday dose. Missed it yesterday.  I do not want to use Bystolic regularly as it says not to be used regularly with Verapamil. He has not checked with his insurance but will try 24 hr meds to precklude his missing a dose. Needs Bmet next time to assess renal function. His updated medication list for this problem includes:    Verapamil Hcl 120 Mg Tabs (Verapamil hcl) ..... One tab by mouth three times a day    Bystolic 5 Mg Tabs (Nebivolol hcl) .Marland Kitchen... 1 tab daily as needed for palpitations    Verapamil Hcl Cr 360 Mg Xr24h-cap (Verapamil hcl) ..... One tab by mouth  at night  BP today: 144/88 Prior BP: 158/90 (09/27/2009)  Complete Medication List: 1)  Verapamil Hcl 120 Mg Tabs (Verapamil hcl) .... One tab by mouth three times a day 2)  Prilosec 20 Mg Cpdr (Omeprazole) .Marland Kitchen.. 1 tablet twice a day are as needed  by mouth 3)  Levitra 20 Mg Tabs (Vardenafil hcl) .... One pill one hour prior 4)  Tylenol 325 Mg Tabs (Acetaminophen) .... Otc as directed. 5)  Bystolic 5 Mg Tabs (Nebivolol hcl) .Marland Kitchen.. 1 tab daily as needed for palpitations 6)  Verapamil Hcl  Cr 360 Mg Xr24h-cap (Verapamil hcl) .... One tab by mouth at night  Patient Instructions: 1)  RTC 4 mos, needs Bmet prior to check renal function. 2)  Call in 2 weeks. Prescriptions: VERAPAMIL HCL CR 360 MG XR24H-CAP (VERAPAMIL HCL) one tab by mouth at night  #90 x 3   Entered and Authorized by:   Shaune Leeks MD   Signed by:   Shaune Leeks MD on 11/01/2009   Method used:   Electronically to        [facility] [phone]* (retail)       [address] [place], [place]  [address]       Ph: [phone]       Fax: [phone]   RxID:   [id number] VERAPAMIL HCL CR 360 MG XR24H-CAP (VERAPAMIL HCL) one tab by mouth at night  #30 x 12   Entered and Authorized by:   Shaune Leeks MD   Signed by:   Shaune Leeks MD on 11/01/2009   Method used:   Electronically to        [facility] [phone]* (retail)       [address] [place], [place]  [address]       Ph: [phone]       Fax: [phone]   RxID:   [id number]   Current Allergies (reviewed today): ! * Christena Deem

## 2010-06-21 NOTE — Assessment & Plan Note (Signed)
Summary: REMOVE SKIN TAGS/[facility]   Vital Signs:  Patient profile:   65 year old male Weight:      240.75 pounds Temp:     98.5 degrees F oral Pulse rate:   64 / minute Pulse rhythm:   regular BP sitting:   140 / 88  (left arm) Cuff size:   large  Vitals Entered By: Sydell Axon LPN (September 16, 2009 10:52 AM) CC: Removal of skin lesions   History of Present Illness: Pt here for removal of suspect lesion at middle of prior [facility] excision site on left forearm. Lesion there a few months. Rides motorcycles and has sun exposure.  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 1 By Mouth Three Times A Day 2)  Prilosec 20 Mg  Cpdr (Omeprazole) .Marland Kitchen.. 1 Tablet Twice A Day Are As Needed  By Mouth 3)  Ibuprofen 800 Mg  Tabs (Ibuprofen) .... One Tab After Brfst, Lunch and Dinner 4)  Levitra 20 Mg Tabs (Vardenafil Hcl) .... One Pill One Hour Prior 5)  Tylenol 325 Mg Tabs (Acetaminophen) .... Otc As Directed. 6)  Bystolic 5 Mg Tabs (Nebivolol Hcl) .Marland Kitchen.. 1 Tab Daily As Needed For Palpitations  Allergies: 1)  ! * Zithromax  Physical Exam  Skin:  L forearm prepped and draped in usual sterile fashion. 1% Lido w/ epi used for local. Lesion excised and 3 4-0 nylon simple interupted sutures place to close. Specimen sent to [facility]. Sterile dressing with neosporin plced. Will see back in 10 days fopr suture removal. Presumed recurrent BCC.   Impression &  Recommendations:  Problem # 1:  CARCINOMA, BASAL CELL (ICD-173.9) Assessment New  Recurrent at previous excision site. Aweait path. Suture removal in 10 days.  Orders: Excise lesion (TAL) 0.6 - 1.0 (11401)  Complete Medication List: 1)  Verapamil Hcl 80 Mg Tabs (Verapamil hcl) .Marland Kitchen.. 1 by mouth three times a day 2)  Prilosec 20 Mg Cpdr (Omeprazole) .Marland Kitchen.. 1 tablet twice a day are as needed  by mouth 3)  Levitra 20 Mg Tabs (Vardenafil hcl) .... One pill one hour prior 4)  Tylenol 325 Mg Tabs (Acetaminophen) .... Otc as directed. 5)  Bystolic 5 Mg Tabs (Nebivolol hcl) .Marland Kitchen.. 1 tab daily as needed for palpitations  Patient Instructions: 1)  RTC 10 days for suture removal.  Current Allergies (reviewed today): ! * [facility]

## 2010-06-21 NOTE — Assessment & Plan Note (Signed)
Summary: TAKE STITCHES OUT OF LEFT ARM / LFW   Vital Signs:  Patient profile:   65 year old male Weight:      246.25 pounds Temp:     97.5 degrees F oral Pulse rate:   64 / minute Pulse rhythm:   regular BP sitting:   158 / 90  (left arm) Cuff size:   large  Vitals Entered By: Sydell Axon LPN (Sep 27, 1608 10:32 AM) CC: Remove stitches, discuss Verapamil   History of Present Illness: Pt here for suture removal. Also was sent request for Lisinopril which he was taken off of  6 weeks ago due to Cr rise.  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 One Tab By Mouth Three Times A Day 2)  Prilosec 20 Mg  Cpdr (Omeprazole) .Marland Kitchen.. 1 Tablet Twice A Day Are As Needed  By Mouth 3)  Levitra 20 Mg Tabs (Vardenafil Hcl) .... One Pill One Hour Prior 4)  Tylenol 325 Mg Tabs (Acetaminophen) .... Otc As Directed. 5)  Bystolic 5 Mg Tabs (Nebivolol Hcl) .Marland Kitchen.. 1 Tab Daily As Needed For Palpitations  Allergies: 1)  ! * Zithromax  Physical Exam  General:  alert, well-developed, well-nourished, and well-hydrated.  NAD Head:  normocephalic, atraumatic, and no abnormalities observed.  Sinuses NT. Eyes:  Conjunctiva clear bilaterally.  Ears:  External ear exam shows no significant lesions or deformities.  Otoscopic examination reveals clear canals, tympanic membranes are intact bilaterally without bulging, retraction, inflammation or discharge. Hearing is  grossly normal bilaterally. Nose:  External nasal examination shows no deformity or inflammation. Nasal mucosa are pink and moist without lesions or exudates. Mouth:  Oral mucosa and oropharynx without lesions or exudates.  Teeth in good repair. Skin:  Left forearm, incision healing well, no fluctulance or erythema. Sutures in place.   Impression & Recommendations:  Problem # 1:  CARCINOMA, BASAL CELL (ICD-173.9) Assessment Improved Recurrent but totally removed. Sutures out. Keep clean and dry for 24 hrs.  Problem # 2:  HYPERTENSION, ESSENTIAL (ICD-401.9) Assessment: Unchanged May need to go to 4 times a day or poss try 24 hour version. Give a few more weeks to settle down. RTC one month. Talk to pharm about whether 24 hr Verapamil is covered. His updated medication list for this problem includes:    Verapamil Hcl 120 Mg Tabs (Verapamil hcl) ..... One tab by mouth three times a day    Bystolic 5 Mg Tabs (Nebivolol hcl) .Marland Kitchen... 1 tab daily as needed for palpitations  BP today: 158/90 Prior BP: 140/88 (09/16/2009)  Complete Medication List: 1)  Verapamil Hcl 120 Mg Tabs (Verapamil hcl) .... One tab by mouth three times a day 2)  Prilosec 20 Mg Cpdr (Omeprazole) .Marland Kitchen.. 1 tablet twice a day are as needed  by mouth 3)  Levitra 20 Mg Tabs (Vardenafil hcl) .... One pill one hour prior 4)  Tylenol 325 Mg Tabs (Acetaminophen) .... Otc as directed.  5)  Bystolic 5 Mg Tabs (Nebivolol hcl) .Marland Kitchen.. 1 tab daily as needed for palpitations  Patient Instructions: 1)  RTC one month for followup.  Current Allergies (reviewed today): ! [facility]

## 2010-06-21 NOTE — Assessment & Plan Note (Signed)
Summary: REMOVE LESION  CYD   Vital Signs:  Patient profile:   65 year old male Weight:      242.25 pounds Temp:     97.6 degrees F oral Pulse rate:   64 / minute Pulse rhythm:   regular BP sitting:   148 / 90  (left arm) Cuff size:   large  Vitals Entered By: Sydell Axon LPN (March 17, 2010 10:20 AM) CC: Lesion on back to be removed   History of Present Illness: Pt here for lesion removal from the back. He also had bloodwork done which requires followup. He feels well with no overt complaints.  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 1 Tablet Twice A Day Are As Needed  By Mouth 2)  Levitra 20 Mg Tabs (Vardenafil Hcl) .... One Pill One Hour Prior 3)  Tylenol 325 Mg Tabs (Acetaminophen) .... Otc As Directed. 4)  Bystolic 5 Mg Tabs (Nebivolol Hcl) .Marland Kitchen.. 1 Tab Daily As Needed For Palpitations 5)  Verapamil Hcl Cr 360 Mg Xr24h-Cap (Verapamil Hcl) .... One Tab By Mouth At Night  Allergies: 1)  ! * Zithromax  Physical Exam  Skin:  Left upper back, 1.5 cm macular very irregular bordered erythematous lesion for removal. Area prepped and draped in the usual sterile fashion. 2% lido w/epi used for local. Elliptical excision made and 3 3-0 nylon sutures placed. Neosporin and sterile dressing placed. Tolerated well.   Impression & Recommendations:  Problem # 1:  CARCINOMA, BASAL CELL (ICD-173.9) Assessment Improved  Excised. Await path  report. Suture removal in 13 days.  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 Will refer for eval. Orders: Dermatology Referral (Derma) Excise lesion (TAL) 1.1 - 2.0 cm (11402)  Problem # 3:  HYPERTENSION, ESSENTIAL (ICD-401.9) Assessment: Unchanged  Still high. Will change Verap to Amlodipine for attempted better control. Recheck. His updated medication list for this problem includes:    Bystolic 5 Mg Tabs (Nebivolol hcl) .Marland Kitchen... 1 tab daily as needed for palpitations    Verapamil Hcl Cr 360 Mg Xr24h-cap (Verapamil hcl) ..... One tab by mouth at night  BP today: 148/90 Prior BP: 140/86 (02/24/2010)  Labs Reviewed: K+: 4.0 (02/24/2010) Creat: : 1.6 (02/25/2010)     Orders: Excise lesion (TAL) 1.1 - 2.0 cm (11402)  Complete Medication List: 1)  Prilosec 20 Mg Cpdr (Omeprazole) .Marland Kitchen.. 1 tablet twice a day are as needed  by mouth 2)  Levitra 20 Mg Tabs (Vardenafil hcl) .... One pill one hour prior 3)  Tylenol 325 Mg Tabs (Acetaminophen) .... Otc as directed. 4)  Bystolic 5 Mg Tabs (Nebivolol hcl) .Marland Kitchen.. 1 tab daily as needed for palpitations 5)  Verapamil Hcl Cr 360 Mg Xr24h-cap (Verapamil hcl) .... One tab by mouth at night  Patient Instructions: 1)  RTC  13 days for suture removal with me.   Orders Added: 1)  Dermatology Referral [Derma] 2)  Excise lesion (TAL) 1.1 - 2.0 cm [11402]    Current Allergies (reviewed today): ! * ZITHROMAX

## 2010-06-21 NOTE — Assessment & Plan Note (Signed)
Summary: F/U/CLE   Vital Signs:  Patient profile:   65 year old male Height:      74.50 inches Weight:      236 pounds BMI:     30.00 Temp:     98.5 degrees F oral Pulse rate:   80 / minute Pulse rhythm:   regular BP sitting:   140 / 86  (left arm) Cuff size:   large  Vitals Entered By: Melody Comas (February 24, 2010 3:35 PM) CC: follow-up visit   History of Present Illness: Pt here for  followup of Htn and palpitations. Was on three times a day Verapamil and we switched to 24 hr variety. He is tolerating better and feels better. HE has not needed Bystolic for palpitations at all. He has a skin lesion on the upper left back he would like examined. Feels well and has been doing well. [place] between  [place] and [place]...quite a workout as lots of turns ans tunnels.  Allergies: 1)  ! * Zithromax  Past History:  Past Surgical History: Last updated: 04/02/2008 R HERNOIORRAPHY YEARS AGO R FOOT MORTON'S NEUROMA EXCISION ABD. ULTRASOUND : NO A.A.A.// RIGHT CYST OF KIDNEY// LEFT KIDNEY SMALL:(11/10/1998) R KNEE ARTHROSCOPY (DR MILLER)(10/2000) ABD. ULTRASOUND : NORMAL :(06/06/2002) EGD-- STRETCHING (DR. ELLIOTT) :(1990"S) EGD DILITATION BX NEGATIVE:(DR.SEIGAL) :(06/1995) RIGHT KNEE ARTHOSCOPY: (10/2000)  Family History: Last updated: 29-Sep-2009 Father: DECEASED AT 72 YOA : + HTN, +KIDNEY FAILURE Mother: DECEASED AT 35 YOA +HTN; +DM BROTHER A 75 (:RON Lang ) DM HTN CHOL ABD Surgery SISTER A 89  DM Obese CABG x 4 CV: NEGATIVE HBP: + SELF; +MOTHER: + FATHER, + BROTHER DM: + MOTHER; + BROTHER GOUT/ARTHRITIS:  PROSTATE CANCER:--- SKIN CANCER: +SELF BREAST/OVARIAN/UTERINE CANCER: COLON CANCER: DEPRESSION: + ? 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  NAD Head:  normocephalic, atraumatic, and no abnormalities observed.  Sinuses NT. Eyes:  Conjunctiva clear bilaterally.  Ears:  External ear exam shows no significant lesions or deformities.  Otoscopic examination reveals clear canals, tympanic membranes are intact bilaterally without bulging, retraction, inflammation or discharge. Hearing is grossly normal bilaterally. Nose:  External nasal examination shows no deformity or inflammation. Nasal mucosa are pink and moist without lesions or exudates. Mouth:  Oral mucosa and oropharynx without lesions or exudates.  Teeth in good repair. Neck:  No deformities, masses, or tenderness noted. Chest Wall:  No deformities, masses, tenderness or gynecomastia noted. Lungs:  normal respiratory effort, no intercostal retractions, no accessory muscle use, and normal breath sounds.   Heart:  normal slow heartrate with  regular rhythm, and no murmur.   Skin:  Lesion on mid upper left back that is indurated and grouped vesscle appearing with look of punch biopsy that has not been biopsied.   Impression & Recommendations:  Problem # 1:  HYPERTENSION, ESSENTIAL (ICD-401.9) Assessment Improved Overall better but still needs improvement. Will follow until suture removal and adjust if still elevated.  Check Bmet today. The following medications were removed from the medication list:    Verapamil Hcl 120 Mg Tabs (Verapamil hcl) ..... One tab by mouth  three times a day His updated medication list for this problem includes:    Bystolic 5 Mg Tabs (Nebivolol hcl) .Marland Kitchen... 1 tab daily as needed for palpitations    Verapamil Hcl Cr 360 Mg Xr24h-cap (Verapamil hcl) ..... One tab by mouth at  night  Orders: TLB-BMP (Basic Metabolic Panel-BMET) (80048-METABOL) Venipuncture (47829)  BP today: 140/86 Prior BP: 144/88 (11/01/2009)  Problem # 2:  PALPITATIONS (ICD-785.1) Assessment: Improved  Much better on 24hr Verapamil. Has not needed Bystolic since switching. Cont CR Verapamil. His updated medication list for this problem includes:    Bystolic 5 Mg Tabs (Nebivolol hcl) .Marland Kitchen... 1 tab daily as needed for palpitations  Avoid caffeine, chocolate, and stimulants. Stress reduction as well as medication options discussed.   Problem # 3:  CARCINOMA, BASAL CELL (ICD-173.9) Assessment: Deteriorated Presumed antoher on left side pof upper left back, poss SCC. Return for excision.  Complete Medication List: 1)  Prilosec 20 Mg Cpdr (Omeprazole) .Marland Kitchen.. 1 tablet twice a day are as needed  by mouth 2)  Levitra 20 Mg Tabs (Vardenafil hcl) .... One pill one hour prior 3)  Tylenol 325 Mg Tabs (Acetaminophen) .... Otc as directed. 4)  Bystolic 5 Mg Tabs (Nebivolol hcl) .Marland Kitchen.. 1 tab daily as needed for palpitations 5)  Verapamil Hcl Cr 360 Mg Xr24h-cap (Verapamil hcl) .... One tab by mouth at night  Patient Instructions: 1)  RTC for skin lesion excision next week, Thu 13 Oct. 2)  30 mins.  Current Allergies (reviewed today): ! [facility]

## 2010-06-21 NOTE — Assessment & Plan Note (Signed)
Summary: 13 DAY FOLLOW UP SUTURE REMOVAL/RBH   Vital Signs:  Patient profile:   65 year old male Weight:      234 pounds Temp:     97.8 degrees F oral Pulse rate:   64 / minute Pulse rhythm:   regular BP sitting:   150 / 100  (left arm) Cuff size:   large  Vitals Entered By: Sydell Axon LPN (March 31, 2010 10:12 AM) CC: Follow-up visit on BP and remove stitches   History of Present Illness: Pt here for suture removal and BP recheck.  He has seen the dermatologist since being here and had 3 more basal cells in addition to the one I took off here on his back.  I had discussed changing BP meds last time from Verapamil to Amlodipine as his BP was high then but decided to wait until today to recheck first.  He has lost weight.  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 1 Tablet Twice A Day Are As Needed  By Mouth 2)  Levitra 20 Mg Tabs (Vardenafil Hcl) .... One Pill One Hour Prior 3)  Tylenol 325 Mg Tabs (Acetaminophen) .... Otc As Directed. 4)  Bystolic 5 Mg Tabs (Nebivolol Hcl) .Marland Kitchen.. 1 Tab Daily As Needed For Palpitations 5)  Verapamil Hcl Cr 360 Mg Xr24h-Cap (Verapamil Hcl) .... One Tab By Mouth At Night  Allergies: 1)  ! * Zithromax  Physical Exam  General:  alert, well-developed, well-nourished, and well-hydrated.  NAD Lighter! Head:  normocephalic, atraumatic, and no abnormalities observed.   Sinuses NT. Eyes:  Conjunctiva clear bilaterally.  Ears:  External ear exam shows no significant lesions or deformities.  Otoscopic examination reveals clear canals, tympanic membranes are intact bilaterally without bulging, retraction, inflammation or discharge. Hearing is grossly normal bilaterally. Nose:  External nasal examination shows no deformity or inflammation. Nasal mucosa are pink and moist without lesions or exudates. Mouth:  Oral mucosa and oropharynx without lesions or exudates.  Teeth in good repair. Neck:  No deformities, masses, or tenderness noted. Lungs:  normal respiratory effort, no intercostal retractions, no accessory muscle use, and normal breath sounds.   Heart:  normal slow heartrate with  regular rhythm, and no murmur.   Skin:  Sutures removed. No steri strips needed.   Impression & Recommendations:  Problem # 1:  HYPERTENSION, ESSENTIAL (ICD-401.9) Assessment Unchanged  Still high, change Verapamil to Amlodipine. The following medications were removed from the medication list:    Verapamil Hcl Cr 360 Mg Xr24h-cap (Verapamil hcl) ..... One tab by mouth at night His updated medication list for this problem includes:    Bystolic 5 Mg Tabs (Nebivolol hcl) .Marland Kitchen... 1 tab daily as needed for palpitations    Amlodipine Besylate 10 Mg Tabs (Amlodipine besylate) ..... One tab by mouth at night  BP today: 150/100 Prior BP: 148/90 (03/17/2010)  Labs Reviewed: K+: 4.0 (02/24/2010) Creat: : 1.6 (02/25/2010)  Problem # 2:  ENCOUNTER FOR REMOVAL OF SUTURES (ICD-V58.32) Assessment: New Accomplished. Keep clean and dry for 24 hrs.  Complete Medication List: 1)  Prilosec 20 Mg Cpdr (Omeprazole) .Marland Kitchen.. 1 tablet twice a day are as needed  by mouth 2)  Levitra 20 Mg Tabs (Vardenafil hcl) .... One pill one hour prior 3)  Tylenol 325 Mg Tabs (Acetaminophen) .... Otc as directed. 4)  Bystolic 5 Mg Tabs (Nebivolol hcl) .Marland Kitchen.. 1 tab daily as needed for palpitations 5)   Amlodipine Besylate 10 Mg Tabs (Amlodipine besylate) .... One tab by mouth at night  Patient Instructions: 1)  RTC 2 mos for BP check. Prescriptions: AMLODIPINE BESYLATE 10 MG TABS (AMLODIPINE BESYLATE) one tab by mouth at night  #90 x 3   Entered and Authorized by:   Shaune Leeks MD   Signed by:   Shaune Leeks MD on 03/31/2010   Method used:   Electronically to        [facility] [phone]* (retail)       [address] [place], [place]  [address]       Ph: [phone]       Fax: [phone]   RxID:   [id number]    Orders Added: 1)  Est. Patient Level IV [24401]    Current Allergies (reviewed today): ! * [facility]

## 2010-06-21 NOTE — Progress Notes (Signed)
Summary: visit covered  Phone Note Call from Patient   Caller: Patient Call For: Shaune Leeks MD Summary of Call: Pt was seen today and was to call and let us know if wellness check would be covered by his insurance.  It will be covered under a physical appt. Initial call taken by: Lowella Petties CMA,  September 02, 2009 12:57 PM  Follow-up for Phone Call        Noted. Follow-up by: Shaune Leeks MD,  September 02, 2009 1:25 PM

## 2010-06-23 NOTE — Assessment & Plan Note (Signed)
Summary: 2 month follow up bp check/rbh   Vital Signs:  Patient profile:   65 year old male Weight:      226 pounds Temp:     99.4 degrees F oral Pulse rate:   64 / minute Pulse rhythm:   regular BP sitting:   114 / 78  (left arm) Cuff size:   large  Vitals Entered By: Sydell Axon LPN (June 02, 2010 8:15 AM) CC: 2 Month folow-up on BP, achy and head congestion   History of Present Illness: Pt here for BP check. We changed from Verapamil to Amlodipine last time. He has more dreams since changing but overall generally feels much better. Today he has a bad cold which  started Sat, 5 days ago. He denies fever or chilss, he ached a lot yesterday. He has no headache, no ear pain, he has signif rhinitis is clear, mild ST initially, signif cough that is nonproductive, no N/V but signif achiness last night. He has taken Robitussin congestion.  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 1 Tablet Twice A Day Are As Needed  By Mouth 2)  Levitra 20 Mg Tabs (Vardenafil Hcl) .... One Pill One Hour Prior 3)  Tylenol 325 Mg Tabs (Acetaminophen) .... Otc As Directed. 4)  Bystolic 5 Mg Tabs (Nebivolol Hcl) .Marland Kitchen.. 1 Tab Daily As Needed For Palpitations 5)  Amlodipine Besylate 10 Mg Tabs (Amlodipine Besylate) .... One Tab By Mouth At Night  Current Medications (verified): 1)  Prilosec 20 Mg  Cpdr (Omeprazole) .Marland Kitchen.. 1  Tablet Twice A Day Are As Needed  By Mouth 2)  Levitra 20 Mg Tabs (Vardenafil Hcl) .... One Pill One Hour Prior 3)  Tylenol 325 Mg Tabs (Acetaminophen) .... Otc As Directed. 4)  Bystolic 5 Mg Tabs (Nebivolol Hcl) .Marland Kitchen.. 1 Tab Daily As Needed For Palpitations 5)  Amlodipine Besylate 10 Mg Tabs (Amlodipine Besylate) .... One Tab By Mouth At Night  Allergies: 1)  ! * Zithromax  Physical Exam  General:  alert, well-developed, well-nourished, and well-hydrated.  NAD Lighter! Mildly congested. Head:  normocephalic, atraumatic, and no abnormalities observed.  Sinuses NT. Eyes:  Conjunctiva clear bilaterally.  Ears:  External ear exam shows no significant lesions or deformities.  Otoscopic examination reveals clear canals, tympanic membranes are intact bilaterally without bulging, retraction, inflammation or discharge. Hearing is grossly normal bilaterally. Nose:  External nasal examination shows no deformity or inflammation. Nasal mucosa are pink and moist without lesions or exudates. <Minimally inflamed with scant clear discharge. Mouth:  Oral mucosa and oropharynx without lesions or exudates.  Teeth in good repair. Min thick PND. Neck:  No deformities, masses, or tenderness noted. Lungs:  normal respiratory effort, no intercostal retractions, no accessory muscle use, and normal breath sounds.   Heart:  normal slow heartrate with  regular rhythm, and no murmur.     Impression & Recommendations:  Problem # 1:  HYPERTENSION, ESSENTIAL (  ICD-401.9) Assessment Improved Pressure much better. 120/78 by me....continue curr meds. His updated medication list for this problem includes:    Bystolic 5 Mg Tabs (Nebivolol hcl) .Marland Kitchen... 1 tab daily as needed for palpitations    Amlodipine Besylate 10 Mg Tabs (Amlodipine besylate) ..... One tab by mouth at night  BP today: 114/78 Prior BP: 150/100 (03/31/2010)  Labs Reviewed: K+: 4.0 (02/24/2010) Creat: : 1.6 (02/25/2010)     Problem # 2:  URI  (ICD-465.9) Assessment: New  See instructions. His updated medication list for this problem includes:    Tylenol 325 Mg Tabs (Acetaminophen) ..... Otc as directed.  Instructed on symptomatic treatment. Call if symptoms persist or worsen.   Complete Medication List: 1)  Prilosec 20 Mg Cpdr (Omeprazole) .Marland Kitchen.. 1 tablet twice a day are as needed  by mouth 2)  Levitra 20 Mg Tabs (Vardenafil hcl) .... One pill one hour prior 3)  Tylenol 325 Mg Tabs (Acetaminophen) .... Otc as directed. 4)  Bystolic 5 Mg Tabs (Nebivolol hcl) .Marland Kitchen.. 1 tab daily as needed for palpitations 5)  Amlodipine Besylate 10 Mg Tabs (Amlodipine besylate) .... One tab by mouth at night  Patient Instructions: 1)  RTC for Comp Exam, labs prior 4/12 or later. 2)  Take Guaifenesin by going to [facility] or [facility] and getting MUCOUS RELIEF EXPECTORANT (400mg ), take 11/2 tabs by mouth AM and NOON. 3)  Drink lots of fluids anytime taking Guaifenesin.  4)  Take Tyl ES 2 tabs three times a day.   Orders Added: 1)  Est. Patient Level III [16109]    Current Allergies (reviewed today): ! [facility]

## 2010-07-30 ENCOUNTER — Encounter: Payer: Self-pay | Admitting: Family Medicine

## 2010-09-04 ENCOUNTER — Other Ambulatory Visit: Payer: Self-pay | Admitting: Family Medicine

## 2010-09-04 DIAGNOSIS — K219 Gastro-esophageal reflux disease without esophagitis: Secondary | ICD-10-CM

## 2010-09-04 DIAGNOSIS — E781 Pure hyperglyceridemia: Secondary | ICD-10-CM

## 2010-09-04 DIAGNOSIS — R7309 Other abnormal glucose: Secondary | ICD-10-CM

## 2010-09-04 DIAGNOSIS — Z0000 Encounter for general adult medical examination without abnormal findings: Secondary | ICD-10-CM

## 2010-09-04 DIAGNOSIS — I10 Essential (primary) hypertension: Secondary | ICD-10-CM

## 2010-09-08 ENCOUNTER — Other Ambulatory Visit (INDEPENDENT_AMBULATORY_CARE_PROVIDER_SITE_OTHER): Payer: Medicare Other

## 2010-09-08 DIAGNOSIS — Z0000 Encounter for general adult medical examination without abnormal findings: Secondary | ICD-10-CM

## 2010-09-08 DIAGNOSIS — K219 Gastro-esophageal reflux disease without esophagitis: Secondary | ICD-10-CM

## 2010-09-08 DIAGNOSIS — I10 Essential (primary) hypertension: Secondary | ICD-10-CM

## 2010-09-08 DIAGNOSIS — R7309 Other abnormal glucose: Secondary | ICD-10-CM

## 2010-09-08 DIAGNOSIS — Z125 Encounter for screening for malignant neoplasm of prostate: Secondary | ICD-10-CM

## 2010-09-08 DIAGNOSIS — E781 Pure hyperglyceridemia: Secondary | ICD-10-CM

## 2010-09-08 DIAGNOSIS — Z79899 Other long term (current) drug therapy: Secondary | ICD-10-CM

## 2010-09-08 LAB — LIPID PANEL
HDL: 37.7 mg/dL — ABNORMAL LOW (ref >39.00)
LDL Cholesterol: 99 mg/dL (ref 0–99)
Total CHOL/HDL Ratio: 4
VLDL: 14.8 mg/dL (ref 0.0–40.0)

## 2010-09-08 LAB — HEPATIC FUNCTION PANEL
Bilirubin, Direct: 0.2 mg/dL (ref 0.0–0.3)
Total Bilirubin: 1.2 mg/dL (ref 0.3–1.2)

## 2010-09-08 LAB — BASIC METABOLIC PANEL
CO2: 30 mEq/L (ref 19–32)
Calcium: 9.4 mg/dL (ref 8.4–10.5)
Chloride: 103 mEq/L (ref 96–112)
Creatinine, Ser: 1 mg/dL (ref 0.4–1.5)
Glucose, Bld: 85 mg/dL (ref 70–99)

## 2010-09-08 LAB — MICROALBUMIN / CREATININE URINE RATIO
Creatinine,U: 341.3 mg/dL
Microalb, Ur: 6.9 mg/dL — ABNORMAL HIGH (ref 0.0–1.9)

## 2010-09-08 LAB — VITAMIN B12: Vitamin B-12: 647 pg/mL (ref 211–911)

## 2010-09-20 ENCOUNTER — Other Ambulatory Visit: Payer: Self-pay | Admitting: *Deleted

## 2010-09-20 MED ORDER — OMEPRAZOLE 20 MG PO CPDR: DELAYED_RELEASE_CAPSULE | ORAL | Status: DC

## 2010-09-22 ENCOUNTER — Ambulatory Visit (INDEPENDENT_AMBULATORY_CARE_PROVIDER_SITE_OTHER): Payer: Medicare Other | Admitting: Family Medicine

## 2010-09-22 ENCOUNTER — Encounter: Payer: Self-pay | Admitting: Family Medicine

## 2010-09-22 VITALS — BP 136/84 | HR 60 | Temp 98.6°F | Ht 74.5 in | Wt 196.0 lb

## 2010-09-22 DIAGNOSIS — Z23 Encounter for immunization: Secondary | ICD-10-CM

## 2010-09-22 DIAGNOSIS — R7309 Other abnormal glucose: Secondary | ICD-10-CM

## 2010-09-22 DIAGNOSIS — C4490 Unspecified malignant neoplasm of skin, unspecified: Secondary | ICD-10-CM

## 2010-09-22 DIAGNOSIS — Z0000 Encounter for general adult medical examination without abnormal findings: Secondary | ICD-10-CM

## 2010-09-22 DIAGNOSIS — I10 Essential (primary) hypertension: Secondary | ICD-10-CM

## 2010-09-22 DIAGNOSIS — E781 Pure hyperglyceridemia: Secondary | ICD-10-CM

## 2010-09-22 DIAGNOSIS — M545 Low back pain: Secondary | ICD-10-CM

## 2010-09-22 NOTE — Assessment & Plan Note (Signed)
Very euglycemic today. Great job w/ wt loss.

## 2010-09-22 NOTE — Patient Instructions (Signed)
RTC prn. Pneumovax today.

## 2010-09-22 NOTE — Progress Notes (Signed)
Addended by: Sydell Axon on: 09/22/2010 03:30 PM   Modules accepted: Orders

## 2010-09-22 NOTE — Assessment & Plan Note (Signed)
Good nos with weight loss. Lab Results  Component Value Date   CHOL 151 09/08/2010   Lab Results  Component Value Date   HDL 37.70* 09/08/2010   Lab Results  Component Value Date   LDLCALC 99 09/08/2010   Lab Results  Component Value Date   TRIG 74.0 09/08/2010   Lab Results  Component Value Date   CHOLHDL 4 09/08/2010   No results found for this basename: LDLDIRECT

## 2010-09-22 NOTE — Assessment & Plan Note (Signed)
Focal pain along the left SI joint area. No swelling or erythema. RTC if worsens.

## 2010-09-22 NOTE — Assessment & Plan Note (Addendum)
Finally good control after losing weight. Maintain. BP Readings from Last 3 Encounters:  09/22/10 136/84  06/02/10 114/78  03/31/10 150/100

## 2010-09-22 NOTE — Assessment & Plan Note (Signed)
Another of the right lower back taken off two days ago at [facility]. Now being seen regularly.

## 2010-09-22 NOTE — Progress Notes (Signed)
  Subjective:    Patient ID: David Mcgee, male    DOB: March 03, 1946, 65 y.o.   MRN: [id number]  HPI Pt here for health maintenance exam. He has lost weight and feels well. He has no complaints today.     Review of Systems  Constitutional: Negative for fever, chills, diaphoresis, appetite change, fatigue and unexpected weight change.  HENT: Positive for tinnitus. Negative for hearing loss, ear pain and ear discharge.   Eyes: Negative for pain and discharge. Visual disturbance: needs eye exam.  Respiratory: Negative for cough, shortness of breath and wheezing.   Cardiovascular: Negative for chest pain and palpitations.       [No SOB w/ exertion Gastrointestinal: Negative for nausea, vomiting, abdominal pain, diarrhea, constipation and blood in stool.       [No heartburn or swallowing problems. Viral AGE twice since last visit. Genitourinary: Negative for dysuria, frequency and difficulty urinating.       [Ome  nocturia Musculoskeletal: Negative for myalgias, back pain and arthralgias.  Skin: Negative for rash.       [No itching or dryness. Neurological: Positive for syncope. Negative for tremors and numbness.       [No tingling or balance problems. Hematological: Negative for adenopathy. Does not bruise/bleed easily.  Psychiatric/Behavioral: Negative for dysphoric mood and agitation.       Objective:   Physical Exam  Constitutional: He is oriented to person, place, and time. He appears well-developed and well-nourished. No distress.  HENT:  Head: Normocephalic and atraumatic.  Right Ear: External ear normal.  Left Ear: External ear normal.  Nose: Nose normal.  Mouth/Throat: Oropharynx is clear and moist.  Eyes: Conjunctivae and EOM are normal. Pupils are equal, round, and reactive to light. Right eye exhibits no discharge. Left eye exhibits no discharge. No scleral icterus.  Neck: Normal range of motion. Neck supple. No thyromegaly present.  Cardiovascular: Normal rate, regular  rhythm, normal heart sounds and intact distal pulses.   No murmur heard. Pulmonary/Chest: Effort normal and breath sounds normal. No respiratory distress. He has no wheezes.  Abdominal: Soft. Bowel sounds are normal. He exhibits no distension and no mass. There is no tenderness. There is no rebound and no guarding.  Genitourinary: Rectum normal, prostate normal and penis normal. Guaiac negative stool.       Prostate R Lobe slightly > L Lobe, smooth and firm. 20-30gms.  Musculoskeletal: Normal range of motion. He exhibits no edema.  Lymphadenopathy:    He has no cervical adenopathy.  Neurological: He is alert and oriented to person, place, and time. Coordination normal.  Skin: Skin is warm and dry. No rash noted. He is not diaphoretic.  Psychiatric: He has a normal mood and affect. His behavior is normal. Judgment and thought content normal.          Assessment & Plan:  Health Maintenance Exam  I have personally reviewed the Medicare Annual Wellness questionnaire and have noted 1. The patient's medical and social history 2. Their use of alcohol, tobacco or illicit drugs 3. Their current medications and supplements 4. The patient's functional ability including ADL's, fall risks, home safety risks and hearing or visual             impairment. 5. Diet and physical activities 6. Evidence for depression or mood disorders

## 2010-09-23 NOTE — Progress Notes (Signed)
Addended byMills Koller on: 09/23/2010 05:22 PM   Modules accepted: Orders

## 2010-09-27 ENCOUNTER — Other Ambulatory Visit: Payer: Medicare Other

## 2010-09-27 ENCOUNTER — Other Ambulatory Visit (INDEPENDENT_AMBULATORY_CARE_PROVIDER_SITE_OTHER): Payer: Medicare Other | Admitting: Family Medicine

## 2010-09-27 DIAGNOSIS — Z1211 Encounter for screening for malignant neoplasm of colon: Secondary | ICD-10-CM

## 2010-09-29 ENCOUNTER — Encounter: Payer: Self-pay | Admitting: *Deleted

## 2010-10-27 ENCOUNTER — Encounter: Payer: Self-pay | Admitting: Cardiovascular Disease

## 2010-10-31 ENCOUNTER — Ambulatory Visit (INDEPENDENT_AMBULATORY_CARE_PROVIDER_SITE_OTHER): Payer: Medicare Other | Admitting: Cardiovascular Disease

## 2010-10-31 ENCOUNTER — Encounter: Payer: Self-pay | Admitting: Cardiovascular Disease

## 2010-10-31 DIAGNOSIS — C4490 Unspecified malignant neoplasm of skin, unspecified: Secondary | ICD-10-CM

## 2010-10-31 DIAGNOSIS — M25569 Pain in unspecified knee: Secondary | ICD-10-CM

## 2010-10-31 DIAGNOSIS — R002 Palpitations: Secondary | ICD-10-CM

## 2010-10-31 DIAGNOSIS — M545 Low back pain: Secondary | ICD-10-CM

## 2010-10-31 DIAGNOSIS — I10 Essential (primary) hypertension: Secondary | ICD-10-CM

## 2010-10-31 DIAGNOSIS — E781 Pure hyperglyceridemia: Secondary | ICD-10-CM

## 2010-10-31 DIAGNOSIS — N529 Male erectile dysfunction, unspecified: Secondary | ICD-10-CM

## 2010-10-31 DIAGNOSIS — K219 Gastro-esophageal reflux disease without esophagitis: Secondary | ICD-10-CM

## 2010-10-31 DIAGNOSIS — M546 Pain in thoracic spine: Secondary | ICD-10-CM

## 2010-10-31 NOTE — Patient Instructions (Signed)
You are doing well. No medication changes were made. Please call us if you have new issues that need to be addressed before your next appt.  We will call you for a follow up Appt. In one year

## 2010-10-31 NOTE — Assessment & Plan Note (Signed)
He has lost 40-50 pounds over the past year. This Has certainly helped his hyperlipidemia which is currently in an excellent range on no medication.
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
## 2010-10-31 NOTE — Assessment & Plan Note (Signed)
No significant palpitations. He is currently off his verapamil. He does have bradycardia and we have suggested that he use bystolic p.r.n. As needed for palpitations though a half dose, 2.5 mg. As his rate is slow, high doses of beta blocker would likely cause him problems.

## 2010-10-31 NOTE — Assessment & Plan Note (Signed)
He does report that blood pressure is well controlled at home. No changes have been made to his medications today

## 2011-04-28 ENCOUNTER — Other Ambulatory Visit: Payer: Self-pay | Admitting: *Deleted

## 2011-04-28 MED ORDER — AMLODIPINE BESYLATE 10 MG PO TABS: 10.0000 mg | ORAL_TABLET | Freq: Every day | ORAL | Status: DC

## 2011-06-30 ENCOUNTER — Other Ambulatory Visit: Payer: Self-pay | Admitting: *Deleted

## 2011-06-30 MED ORDER — OMEPRAZOLE 20 MG PO CPDR: DELAYED_RELEASE_CAPSULE | ORAL | Status: DC

## 2011-06-30 MED ORDER — AMLODIPINE BESYLATE 10 MG PO TABS: 10.0000 mg | ORAL_TABLET | Freq: Every day | ORAL | Status: DC

## 2011-10-11 ENCOUNTER — Other Ambulatory Visit: Payer: Self-pay | Admitting: Family Medicine

## 2011-10-11 ENCOUNTER — Other Ambulatory Visit: Payer: Self-pay | Admitting: *Deleted

## 2011-10-11 DIAGNOSIS — Z125 Encounter for screening for malignant neoplasm of prostate: Secondary | ICD-10-CM

## 2011-10-11 DIAGNOSIS — I10 Essential (primary) hypertension: Secondary | ICD-10-CM

## 2011-10-11 MED ORDER — OMEPRAZOLE 20 MG PO CPDR: DELAYED_RELEASE_CAPSULE | ORAL | Status: DC

## 2011-10-19 ENCOUNTER — Other Ambulatory Visit (INDEPENDENT_AMBULATORY_CARE_PROVIDER_SITE_OTHER): Payer: Medicare Other

## 2011-10-19 DIAGNOSIS — I10 Essential (primary) hypertension: Secondary | ICD-10-CM

## 2011-10-19 DIAGNOSIS — Z125 Encounter for screening for malignant neoplasm of prostate: Secondary | ICD-10-CM

## 2011-10-19 LAB — COMPREHENSIVE METABOLIC PANEL
ALT: 15 U/L (ref 0–53)
CO2: 32 mEq/L (ref 19–32)
Calcium: 9.4 mg/dL (ref 8.4–10.5)
Chloride: 105 mEq/L (ref 96–112)
Creatinine, Ser: 1.2 mg/dL (ref 0.4–1.5)
GFR: 65.42 mL/min (ref >60.00)
Glucose, Bld: 93 mg/dL (ref 70–99)
Sodium: 142 mEq/L (ref 135–145)
Total Bilirubin: 1 mg/dL (ref 0.3–1.2)
Total Protein: 7.6 g/dL (ref 6.0–8.3)

## 2011-10-19 LAB — LIPID PANEL
Cholesterol: 194 mg/dL (ref 0–200)
HDL: 42.4 mg/dL (ref >39.00)
VLDL: 16.8 mg/dL (ref 0.0–40.0)

## 2011-10-20 LAB — PSA, MEDICARE: PSA: 3.39 ng/mL (ref <=4.00)

## 2011-10-23 ENCOUNTER — Encounter: Payer: Self-pay | Admitting: Family Medicine

## 2011-10-23 ENCOUNTER — Ambulatory Visit (INDEPENDENT_AMBULATORY_CARE_PROVIDER_SITE_OTHER): Payer: Medicare Other | Admitting: Family Medicine

## 2011-10-23 VITALS — BP 130/80 | HR 58 | Temp 97.8°F | Ht 75.0 in | Wt 200.0 lb

## 2011-10-23 DIAGNOSIS — K219 Gastro-esophageal reflux disease without esophagitis: Secondary | ICD-10-CM

## 2011-10-23 DIAGNOSIS — Z1211 Encounter for screening for malignant neoplasm of colon: Secondary | ICD-10-CM

## 2011-10-23 DIAGNOSIS — R002 Palpitations: Secondary | ICD-10-CM

## 2011-10-23 DIAGNOSIS — M25569 Pain in unspecified knee: Secondary | ICD-10-CM

## 2011-10-23 DIAGNOSIS — Z0000 Encounter for general adult medical examination without abnormal findings: Secondary | ICD-10-CM

## 2011-10-23 DIAGNOSIS — I10 Essential (primary) hypertension: Secondary | ICD-10-CM

## 2011-10-23 DIAGNOSIS — Z125 Encounter for screening for malignant neoplasm of prostate: Secondary | ICD-10-CM

## 2011-10-23 MED ORDER — NEBIVOLOL HCL 5 MG PO TABS: 5.0000 mg | ORAL_TABLET | Freq: Every day | ORAL | Status: DC | PRN

## 2011-10-23 MED ORDER — TRAMADOL HCL 50 MG PO TABS: 50.0000 mg | ORAL_TABLET | Freq: Three times a day (TID) | ORAL | Status: DC | PRN

## 2011-10-23 MED ORDER — OMEPRAZOLE 20 MG PO CPDR: DELAYED_RELEASE_CAPSULE | ORAL | Status: DC

## 2011-10-23 MED ORDER — AMLODIPINE BESYLATE 10 MG PO TABS: 10.0000 mg | ORAL_TABLET | Freq: Every day | ORAL | Status: DC

## 2011-10-23 NOTE — Progress Notes (Signed)
Saw eye MD 10/21/10. Ninetta Lights, CMA  I have personally reviewed the Medicare Annual Wellness questionnaire and have noted 1. The patient's medical and social history 2. Their use of alcohol, tobacco or illicit drugs 3. Their current medications and supplements 4. The patient's functional ability including ADL's, fall risks, home safety risks and hearing or visual             impairment. 5. Diet and physical activities 6. Evidence for depression or mood disorders  The patients weight, height, BMI have been recorded in the chart and visual acuity is per eye clinic.  I have made referrals, counseling and provided education to the patient based review of the above and I have provided the pt with a written personalized care plan for preventive services.  See scanned forms.  Routine anticipatory guidance given to patient.  See health maintenance. F/u 05/2011 at pharmacy Shingles vaccines encouraged PNA 2012 Tetanus 2010 Eye clinic f/u encouraged Hearing eval declined D/w patient [id number] for colon cancer screening, including IFOB vs. colonoscopy.  Risks and benefits of both were discussed and patient voiced understanding.  Pt elects for: IFOB PSA discussed, along with recent recs.  Will check today.  May not need to check next year.  Chol 2013  Hypertension:    Using medication without problems or lightheadedness: yes Chest pain with exertion:no Edema:no Short of breath:no  GERD controlled with current meds.  Doing well with meds, w/o ADE.  If he skips a dose, he starts to have symptoms.    Palpitations.  Prev with cards eval and given rx for prn BB use.  Hasn't had to use.  Needs refill.  No CP, not SOB.  PTSD.  [place] vet, with insomnia and followed by psychiatry.  No Si/Hi.  Improved on current meds.    Knee pain, L>R, with feeling of locking.  No trauma.  Pain increased with long truck rides, worse early AM and the end of the day. No recent trauma.  Asking about options.  Not  taking NSAIDS to due renal concern.  Prev took high dose ibuprofen.   PMH and SH reviewed  Meds, vitals, and allergies reviewed.   ROS: See HPI.  Otherwise negative.    GEN: nad, alert and oriented HEENT: mucous membranes moist NECK: supple w/o LA CV: rrr. PULM: ctab, no inc wob ABD: soft, +bs EXT: no edema SKIN: no acute rash Prostate gland firm and smooth, very minimal enlargement on R but not on L, but no nodularity, tenderness, mass, or induration. Knees with B crepitus on flex and ext.

## 2011-10-23 NOTE — Patient Instructions (Addendum)
Go to the lab on the way out.  We'll contact you with your lab report. See Shirlee Limerick about your referral before you leave today. Check with your insurance to see if they will cover the shingles shot. I would get a flu shot each fall.   Take the tramadol for pain and see if that helps your knees.

## 2011-10-24 ENCOUNTER — Encounter: Payer: Self-pay | Admitting: *Deleted

## 2011-10-24 ENCOUNTER — Telehealth: Payer: Self-pay | Admitting: Family Medicine

## 2011-10-24 DIAGNOSIS — Z125 Encounter for screening for malignant neoplasm of prostate: Secondary | ICD-10-CM | POA: Insufficient documentation

## 2011-10-24 DIAGNOSIS — Z0000 Encounter for general adult medical examination without abnormal findings: Secondary | ICD-10-CM | POA: Insufficient documentation

## 2011-10-24 NOTE — Telephone Encounter (Signed)
Notify patient.  PSA is slightly higher than prev, but not elevated.  If it stays at this level, nothing else should need to be done.  Since it was slightly increased, I would consider rechecking it next year.  I wouldn't do anything else in the meantime.  Thanks.

## 2011-10-24 NOTE — Telephone Encounter (Signed)
Letter mailed

## 2011-10-24 NOTE — Assessment & Plan Note (Signed)
Controlled, continue current meds. Labs d/w pt.  

## 2011-10-24 NOTE — Assessment & Plan Note (Signed)
Prev saw cards, no recent symptoms.  rx rewritten for prn BB.  Continue as is.

## 2011-10-24 NOTE — Assessment & Plan Note (Signed)
PSA discussed, along with recent recs.  Mild elevation/change from prev but still <4.  Would consider recheck 2014.

## 2011-10-24 NOTE — Assessment & Plan Note (Signed)
Now B, with likely OA.  Would start tramadol with sedation caution.  Refer to ortho.

## 2011-10-24 NOTE — Assessment & Plan Note (Signed)
Controlled with current meds, continue as is.   

## 2011-11-08 ENCOUNTER — Other Ambulatory Visit: Payer: Medicare Other

## 2011-11-08 DIAGNOSIS — Z1211 Encounter for screening for malignant neoplasm of colon: Secondary | ICD-10-CM

## 2011-11-09 ENCOUNTER — Encounter: Payer: Self-pay | Admitting: *Deleted

## 2011-11-15 ENCOUNTER — Encounter: Payer: Self-pay | Admitting: Family Medicine

## 2011-11-16 ENCOUNTER — Encounter: Payer: Self-pay | Admitting: Cardiovascular Disease

## 2011-11-16 ENCOUNTER — Ambulatory Visit (INDEPENDENT_AMBULATORY_CARE_PROVIDER_SITE_OTHER): Payer: Medicare Other | Admitting: Cardiovascular Disease

## 2011-11-16 VITALS — BP 151/77 | HR 59 | Ht 74.0 in | Wt 195.0 lb

## 2011-11-16 DIAGNOSIS — I10 Essential (primary) hypertension: Secondary | ICD-10-CM

## 2011-11-16 DIAGNOSIS — R002 Palpitations: Secondary | ICD-10-CM

## 2011-11-16 DIAGNOSIS — E785 Hyperlipidemia, unspecified: Secondary | ICD-10-CM

## 2011-11-16 MED ORDER — ATORVASTATIN CALCIUM 10 MG PO TABS: 10.0000 mg | ORAL_TABLET | Freq: Every day | ORAL | Status: DC

## 2011-11-16 NOTE — Patient Instructions (Addendum)
You are doing well. Please start lipitor 10 mg daily  Please call us if you have new issues that need to be addressed before your next appt.  Your physician wants you to follow-up in: 12 months.  You will receive a reminder letter in the mail two months in advance. If you don't receive a letter, please call our office to schedule the follow-up appointment.

## 2011-11-16 NOTE — Assessment & Plan Note (Signed)
No recent palpitations. He does take beta blocker when necessary for symptoms.

## 2011-11-16 NOTE — Assessment & Plan Note (Signed)
Blood pressure borderline elevated. We have suggested he monitor his blood pressure at home. He does report systolic pressures in the 130 range at home.
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
## 2011-11-16 NOTE — Assessment & Plan Note (Signed)
We have talked to him about the increase in cholesterol over the past year. Will start low-dose Lipitor 10 mg daily.

## 2012-05-13 ENCOUNTER — Ambulatory Visit: Payer: Self-pay | Admitting: General Practice

## 2012-05-13 DIAGNOSIS — I10 Essential (primary) hypertension: Secondary | ICD-10-CM

## 2012-05-13 LAB — APTT: Activated PTT: 31.6 secs (ref 23.6–35.9)

## 2012-05-13 LAB — BASIC METABOLIC PANEL
EGFR (African American): >60
EGFR (Non-African Amer.): >60
Glucose: 91 mg/dL (ref 65–99)
Sodium: 142 mmol/L (ref 136–145)

## 2012-05-13 LAB — URINALYSIS, COMPLETE
Bacteria: NONE SEEN
Bilirubin,UR: NEGATIVE
Glucose,UR: NEGATIVE mg/dL (ref 0–75)
Nitrite: NEGATIVE
Specific Gravity: 1.016 (ref 1.003–1.030)
WBC UR: 1 /HPF (ref 0–5)

## 2012-05-13 LAB — MRSA PCR SCREENING

## 2012-05-13 LAB — CBC
MCHC: 35.4 g/dL (ref 32.0–36.0)
Platelet: 173 10*3/uL (ref 150–440)
RBC: 5.01 10*6/uL (ref 4.40–5.90)
RDW: 13.1 % (ref 11.5–14.5)
WBC: 5.4 10*3/uL (ref 3.8–10.6)

## 2012-05-27 ENCOUNTER — Inpatient Hospital Stay: Payer: Self-pay | Admitting: General Practice

## 2012-05-28 LAB — BASIC METABOLIC PANEL
Anion Gap: 7 (ref 7–16)
BUN: 13 mg/dL (ref 7–18)
Creatinine: 1.16 mg/dL (ref 0.60–1.30)
EGFR (Non-African Amer.): >60
Glucose: 117 mg/dL — ABNORMAL HIGH (ref 65–99)
Sodium: 139 mmol/L (ref 136–145)

## 2012-05-28 LAB — HEMOGLOBIN: HGB: 14.1 g/dL (ref 13.0–18.0)

## 2012-05-29 LAB — BASIC METABOLIC PANEL
Co2: 33 mmol/L — ABNORMAL HIGH (ref 21–32)
Creatinine: 1.11 mg/dL (ref 0.60–1.30)
Osmolality: 283 (ref 275–301)
Potassium: 4 mmol/L (ref 3.5–5.1)
Sodium: 142 mmol/L (ref 136–145)

## 2012-05-29 LAB — PLATELET COUNT: Platelet: 154 10*3/uL (ref 150–440)

## 2012-05-29 LAB — HEMOGLOBIN: HGB: 13.8 g/dL (ref 13.0–18.0)

## 2012-06-25 ENCOUNTER — Other Ambulatory Visit: Payer: Self-pay | Admitting: *Deleted

## 2012-06-25 MED ORDER — TRAMADOL HCL 50 MG PO TABS: 50.0000 mg | ORAL_TABLET | Freq: Three times a day (TID) | ORAL | Status: DC | PRN

## 2012-06-25 NOTE — Telephone Encounter (Signed)
Sent!

## 2012-06-25 NOTE — Telephone Encounter (Signed)
Faxed refill request.    Last filled:  10/23/11.  Please advise.

## 2012-09-18 ENCOUNTER — Ambulatory Visit: Payer: Self-pay | Admitting: General Practice

## 2012-09-18 LAB — CREATININE, SERUM
EGFR (African American): >60
EGFR (Non-African Amer.): >60

## 2012-10-21 ENCOUNTER — Other Ambulatory Visit: Payer: Self-pay | Admitting: *Deleted

## 2012-10-21 MED ORDER — AMLODIPINE BESYLATE 10 MG PO TABS: 10.0000 mg | ORAL_TABLET | Freq: Every day | ORAL | Status: DC

## 2012-11-07 ENCOUNTER — Other Ambulatory Visit: Payer: Self-pay | Admitting: Neurosurgery

## 2012-11-18 ENCOUNTER — Other Ambulatory Visit: Payer: Self-pay

## 2012-11-19 ENCOUNTER — Other Ambulatory Visit: Payer: Self-pay

## 2012-11-19 MED ORDER — ATORVASTATIN CALCIUM 10 MG PO TABS: 10.0000 mg | ORAL_TABLET | Freq: Every day | ORAL | Status: DC

## 2012-11-19 NOTE — Telephone Encounter (Signed)
Refill sent atorvastatin 10 mg take one tablet daily.

## 2012-11-20 ENCOUNTER — Ambulatory Visit (INDEPENDENT_AMBULATORY_CARE_PROVIDER_SITE_OTHER): Payer: Medicare Other | Admitting: Cardiovascular Disease

## 2012-11-20 ENCOUNTER — Encounter: Payer: Self-pay | Admitting: Cardiovascular Disease

## 2012-11-20 VITALS — BP 122/78 | HR 56 | Ht 75.0 in | Wt 204.0 lb

## 2012-11-20 DIAGNOSIS — E785 Hyperlipidemia, unspecified: Secondary | ICD-10-CM

## 2012-11-20 DIAGNOSIS — R002 Palpitations: Secondary | ICD-10-CM

## 2012-11-20 DIAGNOSIS — I10 Essential (primary) hypertension: Secondary | ICD-10-CM

## 2012-11-20 NOTE — Patient Instructions (Addendum)
You are doing well. No medication changes were made.  Please call us if you have new issues that need to be addressed before your next appt.  Your physician wants you to follow-up in: 12 months.  You will receive a reminder letter in the mail two months in advance. If you don't receive a letter, please call our office to schedule the follow-up appointment. 

## 2012-11-20 NOTE — Assessment & Plan Note (Signed)
We'll try to obtain his lab work from the [place]. Continue on generic Lipitor 10 mg daily for now.

## 2012-11-20 NOTE — Assessment & Plan Note (Signed)
Blood pressure is well controlled on today's visit. No changes made to the medications. 

## 2012-11-20 NOTE — Assessment & Plan Note (Signed)
Rare episodes of ectopy. Not bad enough to need beta blockers.

## 2012-11-20 NOTE — Progress Notes (Signed)
Patient ID: David Mcgee, male    DOB: 08-17-1945, 67 y.o.   MRN: [id number]  HPI Comments: David Mcgee is a very pleasant 67 year old gentleman, patient of Dr. Hetty Ely, with a history of arthritis, hypertension,  tachycardia and palpitations  who presents for routine followup.   Overall he reports that he has been doing well. No  chest pain or SOB. He is active, right his motorcycle frequently. He had evaluation by a PA at the [place] and had lab work done at that time. Results not available. 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 Does have some discomfort in the right knee.  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 Some radiating pain down his right leg.  He is not taking any beta blocker. Rare ectopy.  otherwise has no complaints.  EKG today shows sinus bradycardia with rate 56 beats per minute with no significant ST or T wave changes    Outpatient Encounter Prescriptions as of 11/20/2012  Medication Sig Dispense Refill  . amLODipine (NORVASC) 10 MG tablet Take 1 tablet (10 mg total) by mouth daily. * Needs office visit for any additional refills*  90 tablet  0  . aspirin 81 MG tablet Take 81 mg by mouth daily.        Marland Kitchen atorvastatin (LIPITOR) 10 MG tablet Take 1 tablet (10 mg total) by mouth daily.  30 tablet  6  . Multiple Vitamin (MULTIVITAMIN) tablet Take 1 tablet by mouth daily.      . nebivolol (BYSTOLIC) 5 MG tablet Take 1 tablet (5 mg total) by mouth daily as needed (for palpitations. ).  30 tablet  2  . Omega-3 Fatty Acids (FISH OIL) 1200 MG CAPS Take by mouth 2 (two) times daily.        Marland Kitchen omeprazole (PRILOSEC) 20 MG capsule Take one capsule by mouth twice daily as needed.  180 capsule  3  . sertraline (ZOLOFT) 50 MG tablet Take 50 mg by mouth as needed.       . traMADol (ULTRAM) 50 MG tablet Take 1 tablet (50 mg total) by mouth every 8 (eight) hours as needed for pain (sedation  caution).  50 tablet  1  . traZODone (DESYREL) 50 MG tablet Take 50 mg by mouth daily as needed.       . [DISCONTINUED] acetaminophen (TYLENOL) 325 MG tablet Take 650 mg by mouth as directed.         No facility-administered encounter medications on file as of 11/20/2012.    Review of Systems  Constitutional: Negative.   HENT: Negative.   Eyes: Negative.   Respiratory: Negative.   Gastrointestinal: Negative.   Musculoskeletal: Negative.   Skin: Negative.   Neurological: Negative.   Psychiatric/Behavioral: Negative.   All other systems reviewed and are negative.    BP 122/78  Pulse 56  Ht 6\' 3"  (1.905 m)  Wt 204 lb (92.534 kg)  BMI 25.5 kg/m2  Physical Exam  Nursing note and vitals reviewed. Constitutional: He is oriented to person, place, and time. He appears well-developed and well-nourished.  HENT:  Head: Normocephalic.  Nose: Nose normal.  Mouth/Throat: Oropharynx is clear and moist.  Eyes: Conjunctivae are normal. Pupils are equal, round, and reactive to light.  Neck: Normal range of motion. Neck supple. No JVD present.  Cardiovascular: Normal rate, regular rhythm, S1 normal, S2 normal, normal heart sounds and  intact distal pulses.  Exam reveals no gallop and no friction rub.   No murmur heard. Pulmonary/Chest: Effort normal and breath sounds normal. No respiratory distress. He has no wheezes. He has no rales. He exhibits no tenderness.  Abdominal: Soft. Bowel sounds are normal. He exhibits no distension. There is no tenderness.  Musculoskeletal: Normal range of motion. He exhibits no edema and no tenderness.  Lymphadenopathy:    He has no cervical adenopathy.  Neurological: He is alert and oriented to person, place, and time. Coordination normal.  Skin: Skin is warm and dry. No rash noted. No erythema.  Psychiatric: He has a normal mood and affect. His behavior is normal. Judgment and thought content normal.      Assessment and Plan

## 2012-11-27 ENCOUNTER — Encounter (HOSPITAL_COMMUNITY): Payer: Self-pay | Admitting: Pharmacy Technician

## 2012-11-28 ENCOUNTER — Encounter (HOSPITAL_COMMUNITY)
Admission: RE | Admit: 2012-11-28 | Discharge: 2012-11-28 | Disposition: A | Discharge status: Home / Self Care | Payer: Medicare Other | Source: Ambulatory Visit | Attending: Neurosurgery | Admitting: Neurosurgery

## 2012-11-28 ENCOUNTER — Encounter (HOSPITAL_COMMUNITY): Payer: Self-pay

## 2012-11-28 DIAGNOSIS — Z01818 Encounter for other preprocedural examination: Secondary | ICD-10-CM | POA: Insufficient documentation

## 2012-11-28 DIAGNOSIS — Z01811 Encounter for preprocedural respiratory examination: Secondary | ICD-10-CM | POA: Insufficient documentation

## 2012-11-28 DIAGNOSIS — Z01812 Encounter for preprocedural laboratory examination: Secondary | ICD-10-CM | POA: Insufficient documentation

## 2012-11-28 HISTORY — DX: Personal history of other diseases of the digestive system: Z87.19

## 2012-11-28 HISTORY — DX: Depression, unspecified: F32.A

## 2012-11-28 HISTORY — DX: Essential (primary) hypertension: I10

## 2012-11-28 HISTORY — DX: Major depressive disorder, single episode, unspecified: F32.9

## 2012-11-28 HISTORY — DX: Personal history of other medical treatment: Z92.89

## 2012-11-28 HISTORY — DX: Unspecified osteoarthritis, unspecified site: M19.90

## 2012-11-28 HISTORY — DX: Chronic kidney disease, unspecified: N18.9

## 2012-11-28 LAB — CBC
Platelets: 166 10*3/uL (ref 150–400)
RDW: 13.8 % (ref 11.5–15.5)
WBC: 6.1 10*3/uL (ref 4.0–10.5)

## 2012-11-28 LAB — SURGICAL PCR SCREEN: Staphylococcus aureus: NEGATIVE

## 2012-11-28 LAB — BASIC METABOLIC PANEL
Chloride: 103 mEq/L (ref 96–112)
Creatinine, Ser: 1.2 mg/dL (ref 0.50–1.35)
GFR calc Af Amer: 70 mL/min — ABNORMAL LOW (ref >90)
Potassium: 4 mEq/L (ref 3.5–5.1)

## 2012-11-28 NOTE — Pre-Procedure Instructions (Signed)
David Mcgee  11/28/2012   Your procedure is scheduled on:  12/09/2012  Report to [facility] at 5:30 AM.  Call this number if you have problems the morning of surgery: [phone]   Remember:   Do not eat food or drink liquids after midnight. On Sunday the 20th   Take these medicines the morning of surgery with A SIP OF WATER: omeprazole    Do not wear jewelry  Do not wear lotions, powders, or perfumes. You may wear deodorant.           . Men may shave face and neck.  Do not bring valuables to the hospital.  [facility] is not responsible                   for any belongings or valuables.  Contacts, dentures or bridgework may not be worn into surgery.  Leave suitcase in the car. After surgery it may be brought to your room.  For patients admitted to the hospital, checkout time is 11:00 AM the day of  discharge.   Patients discharged the day of surgery will not be allowed to drive  home.  Name and phone number of your driver: /w wife  Special Instructions: Shower using CHG 2 nights before surgery and the night before surgery.  If you shower the day of surgery use CHG.  Use special wash - you have one bottle of CHG for all showers.  You should use approximately 1/3 of the bottle for each shower.   Please read over the following fact sheets that you were given: Pain Booklet, Coughing and Deep Breathing, MRSA Information and Surgical Site Infection Prevention

## 2012-12-08 MED ORDER — CEFAZOLIN SODIUM-DEXTROSE 2-3 GM-% IV SOLR
2.0000 g | INTRAVENOUS | Status: AC
  Administered 2012-12-09: 2 g via INTRAVENOUS

## 2012-12-09 ENCOUNTER — Encounter (HOSPITAL_COMMUNITY): Payer: Self-pay | Admitting: Certified Registered"

## 2012-12-09 ENCOUNTER — Observation Stay (HOSPITAL_COMMUNITY)
Admission: RE | Admit: 2012-12-09 | Discharge: 2012-12-10 | DRG: 491 | Disposition: A | Discharge status: Home / Self Care | Payer: Medicare Other | Source: Ambulatory Visit | Attending: Neurosurgery | Admitting: Neurosurgery

## 2012-12-09 ENCOUNTER — Encounter (HOSPITAL_COMMUNITY)
Admission: RE | Disposition: A | Discharge status: Home / Self Care | Payer: Self-pay | Source: Ambulatory Visit | Attending: Neurosurgery

## 2012-12-09 ENCOUNTER — Inpatient Hospital Stay (HOSPITAL_COMMUNITY): Payer: Medicare Other

## 2012-12-09 ENCOUNTER — Encounter (HOSPITAL_COMMUNITY): Payer: Self-pay | Admitting: *Deleted

## 2012-12-09 ENCOUNTER — Inpatient Hospital Stay (HOSPITAL_COMMUNITY): Payer: Medicare Other | Admitting: Certified Registered"

## 2012-12-09 DIAGNOSIS — I129 Hypertensive chronic kidney disease with stage 1 through stage 4 chronic kidney disease, or unspecified chronic kidney disease: Secondary | ICD-10-CM | POA: Insufficient documentation

## 2012-12-09 DIAGNOSIS — K219 Gastro-esophageal reflux disease without esophagitis: Secondary | ICD-10-CM | POA: Insufficient documentation

## 2012-12-09 DIAGNOSIS — Z7982 Long term (current) use of aspirin: Secondary | ICD-10-CM | POA: Insufficient documentation

## 2012-12-09 DIAGNOSIS — M159 Polyosteoarthritis, unspecified: Secondary | ICD-10-CM | POA: Insufficient documentation

## 2012-12-09 DIAGNOSIS — M7130 Other bursal cyst, unspecified site: Principal | ICD-10-CM | POA: Insufficient documentation

## 2012-12-09 DIAGNOSIS — F4310 Post-traumatic stress disorder, unspecified: Secondary | ICD-10-CM | POA: Insufficient documentation

## 2012-12-09 DIAGNOSIS — N189 Chronic kidney disease, unspecified: Secondary | ICD-10-CM | POA: Insufficient documentation

## 2012-12-09 HISTORY — PX: LUMBAR LAMINECTOMY/DECOMPRESSION MICRODISCECTOMY: SHX5026

## 2012-12-09 SURGERY — LUMBAR LAMINECTOMY/DECOMPRESSION MICRODISCECTOMY 1 LEVEL
Anesthesia: General | Site: Back | Laterality: Right | Wound class: Clean

## 2012-12-09 MED ORDER — 0.9 % SODIUM CHLORIDE (POUR BTL) OPTIME
TOPICAL | Status: DC | PRN
  Administered 2012-12-09: 1000 mL

## 2012-12-09 MED ORDER — OXYCODONE HCL 5 MG/5ML PO SOLN: 5.0000 mg | Freq: Once | ORAL | Status: DC | PRN

## 2012-12-09 MED ORDER — PROMETHAZINE HCL 25 MG/ML IJ SOLN: 6.2500 mg | INTRAMUSCULAR | Status: DC | PRN

## 2012-12-09 MED ORDER — MENTHOL 3 MG MT LOZG: 1.0000 | LOZENGE | OROMUCOSAL | Status: DC | PRN

## 2012-12-09 MED ORDER — NEBIVOLOL HCL 5 MG PO TABS
5.0000 mg | ORAL_TABLET | Freq: Every day | ORAL | Status: DC
  Filled 2012-12-09 (×2): qty 1

## 2012-12-09 MED ORDER — HEMOSTATIC AGENTS (NO CHARGE) OPTIME
TOPICAL | Status: DC | PRN
  Administered 2012-12-09: 1 via TOPICAL

## 2012-12-09 MED ORDER — SODIUM CHLORIDE 0.9 % IV SOLN
INTRAVENOUS | Status: AC
  Filled 2012-12-09: qty 500

## 2012-12-09 MED ORDER — ONDANSETRON HCL 4 MG/2ML IJ SOLN: 4.0000 mg | INTRAMUSCULAR | Status: DC | PRN

## 2012-12-09 MED ORDER — HYDROMORPHONE HCL PF 1 MG/ML IJ SOLN: 0.2500 mg | INTRAMUSCULAR | Status: DC | PRN

## 2012-12-09 MED ORDER — SERTRALINE HCL 50 MG PO TABS
50.0000 mg | ORAL_TABLET | Freq: Every day | ORAL | Status: DC
  Filled 2012-12-09 (×2): qty 1

## 2012-12-09 MED ORDER — SODIUM CHLORIDE 0.9 % IJ SOLN: 3.0000 mL | INTRAMUSCULAR | Status: DC | PRN

## 2012-12-09 MED ORDER — KETOROLAC TROMETHAMINE 30 MG/ML IJ SOLN
INTRAMUSCULAR | Status: DC | PRN
  Administered 2012-12-09: 15 mg via INTRAVENOUS

## 2012-12-09 MED ORDER — ONE-DAILY MULTI VITAMINS PO TABS: 1.0000 | ORAL_TABLET | Freq: Every day | ORAL | Status: DC

## 2012-12-09 MED ORDER — CEFAZOLIN SODIUM 1-5 GM-% IV SOLN
1.0000 g | Freq: Three times a day (TID) | INTRAVENOUS | Status: AC
  Administered 2012-12-09 – 2012-12-10 (×2): 1 g via INTRAVENOUS
  Filled 2012-12-09 (×2): qty 50

## 2012-12-09 MED ORDER — OXYCODONE HCL 5 MG PO TABS: 5.0000 mg | ORAL_TABLET | Freq: Once | ORAL | Status: DC | PRN

## 2012-12-09 MED ORDER — LACTATED RINGERS IV SOLN
INTRAVENOUS | Status: DC | PRN
  Administered 2012-12-09 (×2): via INTRAVENOUS

## 2012-12-09 MED ORDER — ACETAMINOPHEN 325 MG PO TABS: 650.0000 mg | ORAL_TABLET | ORAL | Status: DC | PRN

## 2012-12-09 MED ORDER — SUFENTANIL CITRATE 50 MCG/ML IV SOLN
INTRAVENOUS | Status: DC | PRN
  Administered 2012-12-09: 20 ug via INTRAVENOUS
  Administered 2012-12-09: 10 ug via INTRAVENOUS

## 2012-12-09 MED ORDER — CYCLOBENZAPRINE HCL 10 MG PO TABS: 10.0000 mg | ORAL_TABLET | Freq: Three times a day (TID) | ORAL | Status: DC | PRN

## 2012-12-09 MED ORDER — ADULT MULTIVITAMIN W/MINERALS CH
1.0000 | ORAL_TABLET | Freq: Every day | ORAL | Status: DC
  Filled 2012-12-09 (×2): qty 1

## 2012-12-09 MED ORDER — OXYCODONE-ACETAMINOPHEN 5-325 MG PO TABS: 1.0000 | ORAL_TABLET | ORAL | Status: DC | PRN

## 2012-12-09 MED ORDER — PROPOFOL 10 MG/ML IV BOLUS
INTRAVENOUS | Status: DC | PRN
  Administered 2012-12-09: 130 mg via INTRAVENOUS

## 2012-12-09 MED ORDER — SODIUM CHLORIDE 0.9 % IR SOLN
Status: DC | PRN
  Administered 2012-12-09: 08:00:00

## 2012-12-09 MED ORDER — ASPIRIN 81 MG PO TABS
81.0000 mg | ORAL_TABLET | Freq: Every day | ORAL | Status: DC
Start: 2012-12-09 — End: 2012-12-09

## 2012-12-09 MED ORDER — OMEGA-3-ACID ETHYL ESTERS 1 G PO CAPS
1.0000 g | ORAL_CAPSULE | Freq: Two times a day (BID) | ORAL | Status: DC
  Administered 2012-12-09: 1 g via ORAL
  Filled 2012-12-09 (×4): qty 1

## 2012-12-09 MED ORDER — SODIUM CHLORIDE 0.9 % IJ SOLN
3.0000 mL | Freq: Two times a day (BID) | INTRAMUSCULAR | Status: DC
  Administered 2012-12-09 (×2): 3 mL via INTRAVENOUS

## 2012-12-09 MED ORDER — MIDAZOLAM HCL 2 MG/2ML IJ SOLN: 0.5000 mg | Freq: Once | INTRAMUSCULAR | Status: DC | PRN

## 2012-12-09 MED ORDER — MIDAZOLAM HCL 5 MG/5ML IJ SOLN
INTRAMUSCULAR | Status: DC | PRN
  Administered 2012-12-09: 2 mg via INTRAVENOUS

## 2012-12-09 MED ORDER — BACITRACIN 50000 UNITS IM SOLR
INTRAMUSCULAR | Status: AC
  Filled 2012-12-09: qty 1

## 2012-12-09 MED ORDER — ATORVASTATIN CALCIUM 10 MG PO TABS
10.0000 mg | ORAL_TABLET | Freq: Every day | ORAL | Status: DC
  Administered 2012-12-09: 10 mg via ORAL
  Filled 2012-12-09 (×2): qty 1

## 2012-12-09 MED ORDER — ASPIRIN 81 MG PO CHEW
81.0000 mg | CHEWABLE_TABLET | Freq: Every day | ORAL | Status: DC
  Administered 2012-12-09: 81 mg via ORAL
  Filled 2012-12-09 (×2): qty 1

## 2012-12-09 MED ORDER — GLYCOPYRROLATE 0.2 MG/ML IJ SOLN
INTRAMUSCULAR | Status: DC | PRN
  Administered 2012-12-09: 0.2 mg via INTRAVENOUS

## 2012-12-09 MED ORDER — CEFAZOLIN SODIUM-DEXTROSE 2-3 GM-% IV SOLR
INTRAVENOUS | Status: AC
  Filled 2012-12-09: qty 50

## 2012-12-09 MED ORDER — LIDOCAINE-EPINEPHRINE 1 %-1:100000 IJ SOLN
INTRAMUSCULAR | Status: DC | PRN
  Administered 2012-12-09: 10 mL

## 2012-12-09 MED ORDER — TRAZODONE HCL 50 MG PO TABS
50.0000 mg | ORAL_TABLET | Freq: Every day | ORAL | Status: DC | PRN
  Administered 2012-12-09: 50 mg via ORAL
  Filled 2012-12-09: qty 1

## 2012-12-09 MED ORDER — THROMBIN 5000 UNITS EX SOLR
CUTANEOUS | Status: DC | PRN
  Administered 2012-12-09 (×2): 5000 [IU] via TOPICAL

## 2012-12-09 MED ORDER — PANTOPRAZOLE SODIUM 40 MG PO TBEC: 40.0000 mg | DELAYED_RELEASE_TABLET | Freq: Every day | ORAL | Status: DC

## 2012-12-09 MED ORDER — ONDANSETRON HCL 4 MG/2ML IJ SOLN
INTRAMUSCULAR | Status: DC | PRN
  Administered 2012-12-09: 4 mg via INTRAVENOUS

## 2012-12-09 MED ORDER — BUPIVACAINE HCL (PF) 0.25 % IJ SOLN
INTRAMUSCULAR | Status: DC | PRN
  Administered 2012-12-09: 10 mL

## 2012-12-09 MED ORDER — TRAMADOL HCL 50 MG PO TABS
50.0000 mg | ORAL_TABLET | Freq: Four times a day (QID) | ORAL | Status: DC | PRN
  Administered 2012-12-09: 50 mg via ORAL
  Filled 2012-12-09 (×2): qty 1

## 2012-12-09 MED ORDER — PHENOL 1.4 % MT LIQD: 1.0000 | OROMUCOSAL | Status: DC | PRN

## 2012-12-09 MED ORDER — ACETAMINOPHEN 650 MG RE SUPP: 650.0000 mg | RECTAL | Status: DC | PRN

## 2012-12-09 MED ORDER — MEPERIDINE HCL 25 MG/ML IJ SOLN: 6.2500 mg | INTRAMUSCULAR | Status: DC | PRN

## 2012-12-09 MED ORDER — ROCURONIUM BROMIDE 100 MG/10ML IV SOLN
INTRAVENOUS | Status: DC | PRN
  Administered 2012-12-09: 50 mg via INTRAVENOUS

## 2012-12-09 MED ORDER — DOCUSATE SODIUM 100 MG PO CAPS
100.0000 mg | ORAL_CAPSULE | Freq: Two times a day (BID) | ORAL | Status: DC
  Administered 2012-12-09: 100 mg via ORAL
  Filled 2012-12-09: qty 1

## 2012-12-09 MED ORDER — HYPROMELLOSE (GONIOSCOPIC) 2.5 % OP SOLN
1.0000 [drp] | OPHTHALMIC | Status: DC | PRN
  Filled 2012-12-09: qty 15

## 2012-12-09 MED ORDER — DEXAMETHASONE SODIUM PHOSPHATE 10 MG/ML IJ SOLN: 10.0000 mg | INTRAMUSCULAR | Status: DC

## 2012-12-09 MED ORDER — DEXAMETHASONE SODIUM PHOSPHATE 10 MG/ML IJ SOLN
INTRAMUSCULAR | Status: AC
  Administered 2012-12-09: 10 mg via INTRAVENOUS
  Filled 2012-12-09: qty 1

## 2012-12-09 MED ORDER — AMLODIPINE BESYLATE 10 MG PO TABS
10.0000 mg | ORAL_TABLET | Freq: Every day | ORAL | Status: DC
  Administered 2012-12-09: 10 mg via ORAL
  Filled 2012-12-09 (×2): qty 1

## 2012-12-09 MED ORDER — ALUM & MAG HYDROXIDE-SIMETH 200-200-20 MG/5ML PO SUSP: 30.0000 mL | Freq: Four times a day (QID) | ORAL | Status: DC | PRN

## 2012-12-09 MED ORDER — HYDROMORPHONE HCL PF 1 MG/ML IJ SOLN
0.5000 mg | INTRAMUSCULAR | Status: DC | PRN
  Administered 2012-12-09: 1 mg via INTRAVENOUS
  Filled 2012-12-09: qty 1

## 2012-12-09 MED ORDER — LIDOCAINE HCL (CARDIAC) 20 MG/ML IV SOLN
INTRAVENOUS | Status: DC | PRN
  Administered 2012-12-09: 25 mg via INTRAVENOUS

## 2012-12-09 SURGICAL SUPPLY — 59 items
ADH SKN CLS APL DERMABOND .7 (GAUZE/BANDAGES/DRESSINGS) ×1
APL SKNCLS STERI-STRIP NONHPOA (GAUZE/BANDAGES/DRESSINGS) ×1
BAG DECANTER FOR FLEXI CONT (MISCELLANEOUS) ×2 IMPLANT
BENZOIN TINCTURE PRP APPL 2/3 (GAUZE/BANDAGES/DRESSINGS) ×2 IMPLANT
BLADE SURG 11 STRL SS (BLADE) ×2 IMPLANT
BLADE SURG ROTATE 9660 (MISCELLANEOUS) IMPLANT
BRUSH SCRUB EZ PLAIN DRY (MISCELLANEOUS) ×2 IMPLANT
BUR MATCHSTICK NEURO 3.0 LAGG (BURR) ×2 IMPLANT
BUR PRECISION FLUTE 6.0 (BURR) ×2 IMPLANT
CANISTER SUCTION 2500CC (MISCELLANEOUS) ×2 IMPLANT
CLOTH BEACON ORANGE TIMEOUT ST (SAFETY) ×2 IMPLANT
CONT SPEC 4OZ CLIKSEAL STRL BL (MISCELLANEOUS) ×2 IMPLANT
DECANTER SPIKE VIAL GLASS SM (MISCELLANEOUS) ×2 IMPLANT
DERMABOND ADVANCED (GAUZE/BANDAGES/DRESSINGS) ×1
DERMABOND ADVANCED .7 DNX12 (GAUZE/BANDAGES/DRESSINGS) ×1 IMPLANT
DRAPE LAPAROTOMY 100X72X124 (DRAPES) ×2 IMPLANT
DRAPE MICROSCOPE ZEISS OPMI (DRAPES) ×2 IMPLANT
DRAPE POUCH INSTRU U-SHP 10X18 (DRAPES) ×2 IMPLANT
DRAPE PROXIMA HALF (DRAPES) IMPLANT
DRAPE SURG 17X23 STRL (DRAPES) ×2 IMPLANT
DRSG OPSITE 4X5.5 SM (GAUZE/BANDAGES/DRESSINGS) ×2 IMPLANT
DRSG OPSITE POSTOP 3X4 (GAUZE/BANDAGES/DRESSINGS) ×1 IMPLANT
DURAPREP 26ML APPLICATOR (WOUND CARE) ×2 IMPLANT
ELECT REM PT RETURN 9FT ADLT (ELECTROSURGICAL) ×2
ELECTRODE REM PT RTRN 9FT ADLT (ELECTROSURGICAL) ×1 IMPLANT
EVACUATOR 1/8 PVC DRAIN (DRAIN) ×1 IMPLANT
GAUZE SPONGE 4X4 16PLY XRAY LF (GAUZE/BANDAGES/DRESSINGS) IMPLANT
GLOVE BIO SURGEON STRL SZ8 (GLOVE) ×3 IMPLANT
GLOVE EXAM NITRILE LRG STRL (GLOVE) ×1 IMPLANT
GLOVE EXAM NITRILE MD LF STRL (GLOVE) IMPLANT
GLOVE EXAM NITRILE XL STR (GLOVE) IMPLANT
GLOVE EXAM NITRILE XS STR PU (GLOVE) IMPLANT
GLOVE INDICATOR 8.5 STRL (GLOVE) ×2 IMPLANT
GLOVE OPTIFIT SS 8.0 STRL (GLOVE) ×2 IMPLANT
GLOVE SURG SS PI 8.0 STRL IVOR (GLOVE) ×4 IMPLANT
GOWN BRE IMP SLV AUR LG STRL (GOWN DISPOSABLE) ×1 IMPLANT
GOWN BRE IMP SLV AUR XL STRL (GOWN DISPOSABLE) ×6 IMPLANT
GOWN STRL REIN 2XL LVL4 (GOWN DISPOSABLE) IMPLANT
HEMOSTAT POWDER KIT SURGIFOAM (HEMOSTASIS) ×1 IMPLANT
KIT BASIN OR (CUSTOM PROCEDURE TRAY) ×2 IMPLANT
KIT ROOM TURNOVER OR (KITS) ×2 IMPLANT
NDL SPNL 22GX3.5 QUINCKE BK (NEEDLE) ×1 IMPLANT
NEEDLE HYPO 22GX1.5 SAFETY (NEEDLE) ×2 IMPLANT
NEEDLE SPNL 22GX3.5 QUINCKE BK (NEEDLE) ×2 IMPLANT
NS IRRIG 1000ML POUR BTL (IV SOLUTION) ×2 IMPLANT
PACK LAMINECTOMY NEURO (CUSTOM PROCEDURE TRAY) ×2 IMPLANT
RUBBERBAND STERILE (MISCELLANEOUS) ×4 IMPLANT
SPONGE GAUZE 4X4 12PLY (GAUZE/BANDAGES/DRESSINGS) ×2 IMPLANT
SPONGE SURGIFOAM ABS GEL SZ50 (HEMOSTASIS) ×2 IMPLANT
STRIP CLOSURE SKIN 1/2X4 (GAUZE/BANDAGES/DRESSINGS) ×2 IMPLANT
SUT VIC AB 0 CT1 18XCR BRD8 (SUTURE) ×1 IMPLANT
SUT VIC AB 0 CT1 8-18 (SUTURE) ×2
SUT VIC AB 2-0 CT1 18 (SUTURE) ×2 IMPLANT
SUT VICRYL 4-0 PS2 18IN ABS (SUTURE) ×2 IMPLANT
SYR 20ML ECCENTRIC (SYRINGE) ×2 IMPLANT
TAPE STRIPS DRAPE STRL (GAUZE/BANDAGES/DRESSINGS) ×1 IMPLANT
TOWEL OR 17X24 6PK STRL BLUE (TOWEL DISPOSABLE) ×2 IMPLANT
TOWEL OR 17X26 10 PK STRL BLUE (TOWEL DISPOSABLE) ×2 IMPLANT
WATER STERILE IRR 1000ML POUR (IV SOLUTION) ×2 IMPLANT

## 2012-12-09 NOTE — Anesthesia Postprocedure Evaluation (Signed)
  Anesthesia Post-op Note  Patient: David Mcgee  Procedure(s) Performed: Procedure(s) with comments: LUMBAR LAMINECTOMY/DECOMPRESSION MICRODISCECTOMY 1 LEVEL (Right) - LUMBAR LAMINECTOMY/DECOMPRESSION MICRODISCECTOMY 1 LEVEL  Patient Location: PACU  Anesthesia Type:General  Level of Consciousness: awake, alert , oriented and patient cooperative  Airway and Oxygen Therapy: Patient Spontanous Breathing and Patient connected to nasal cannula oxygen  Post-op Pain: mild  Post-op Assessment: Post-op Vital signs reviewed, Patient's Cardiovascular Status Stable, Respiratory Function Stable, Patent Airway, No signs of Nausea or vomiting and Pain level controlled  Post-op Vital Signs: Reviewed and stable  Complications: No apparent anesthesia complications

## 2012-12-09 NOTE — Anesthesia Preprocedure Evaluation (Addendum)
Anesthesia Evaluation  Patient identified by MRN, date of birth, ID band Patient awake    Reviewed: Allergy & Precautions, H&P , NPO status , Patient's Chart, lab work & pertinent test results  History of Anesthesia Complications Negative for: history of anesthetic complications  Airway Mallampati: II TM Distance: >3 FB Neck ROM: Full    Dental  (+) Teeth Intact and Dental Advisory Given   Pulmonary neg pulmonary ROS,  breath sounds clear to auscultation  Pulmonary exam normal       Cardiovascular hypertension, Pt. on medications Rhythm:Regular Rate:Normal     Neuro/Psych negative neurological ROS     GI/Hepatic Neg liver ROS, hiatal hernia, GERD-  Medicated and Controlled,  Endo/Other  negative endocrine ROS  Renal/GU Renal diseaseCreat 1.20     Musculoskeletal   Abdominal   Peds  Hematology   Anesthesia Other Findings   Reproductive/Obstetrics                          Anesthesia Physical Anesthesia Plan  ASA: II  Anesthesia Plan: General   Post-op Pain Management:    Induction: Intravenous  Airway Management Planned: Oral ETT  Additional Equipment:   Intra-op Plan:   Post-operative Plan: Extubation in OR  Informed Consent: I have reviewed the patients History and Physical, chart, labs and discussed the procedure including the risks, benefits and alternatives for the proposed anesthesia with the patient or authorized representative who has indicated his/her understanding and acceptance.   Dental advisory given  Plan Discussed with: CRNA, Anesthesiologist and Surgeon  Anesthesia Plan Comments: (Plan routine monitors, GETA)       Anesthesia Quick Evaluation

## 2012-12-09 NOTE — H&P (Signed)
David Mcgee is an 67 y.o. male.   Chief Complaint: Back and right leg pain HPI: Patient is a pleasant 67 year old some is a progress worsening back and right leg pain with weakness in his proximal right leg is been refractory to all forms of conservative treatment anti-inflammatories physical therapy and steroids. His imaging with an MRI scan of his back in flexion extension films show a large synovial cyst displacing the thecal sac causing severe thecal sac compression and compression of the right L3 and L4 nerve roots. Due to patient's clinical syndrome imaging findings and failure conservative treatment I recommended laminectomy for resection of large synovial cyst I extensively reviewed the risks and benefits of the operation as well as perioperative course expectations outcome alternatives surgery he understands and agrees to proceed forward.  Past Medical History  Diagnosis Date  . Palpitations   . GERD (gastroesophageal reflux disease)   . Insomnia   . PTSD (post-traumatic stress disorder)   . Cancer     Skin, not melanoma  . Spinal cord cysts   . Hypertension   . History of stress test     long time ago- wnl  . Depression     ? panic attacks, takes sertraline & also sees psych q 3 months   . 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 H/O hiatal hernia   . Arthritis     back, knees, hands - OA  . PTSD (post-traumatic stress disorder)     post [place]    Past Surgical History  Procedure Laterality Date  . Neuroma surgery      Right foot Morton's neuroma excision  . Knee arthroscopy  10/2000    Right Knee (Dr. Hyacinth Meeker)  . Hernoiorraphy      Right; years ago  . Esophagogastroduodenoscopy  06/1995    Dilitation bx negative (Dr. Sharen Hint)  . Total knee arthroplasty    . Joint replacement  05/2012    L  . Tonsillectomy      Family History  Problem Relation Age of Onset  . Hypertension Mother   . Diabetes Mother   . Kidney  failure Father   . Hypertension Father   . Obesity Sister   . Diabetes Sister   . Diabetes Brother   . Hypertension Brother   . Hyperlipidemia Brother   . Stroke Brother   . Depression Brother     ?  . Colon cancer Neg Hx   . Prostate cancer Neg Hx    Social History:  reports that he quit smoking about 47 years ago. His smoking use included Cigarettes. He has a 13.5 pack-year smoking history. He has never used smokeless tobacco. He reports that he does not drink alcohol or use illicit drugs.  Allergies:  Allergies  Allergen Reactions  . Azithromycin Nausea Only    Medications Prior to Admission  Medication Sig Dispense Refill  . amLODipine (NORVASC) 10 MG tablet Take 10 mg by mouth at bedtime. * Needs office visit for any additional refills*      . aspirin 81 MG tablet Take 81 mg by mouth at bedtime.       Marland Kitchen atorvastatin (LIPITOR) 10 MG tablet Take 10 mg by mouth at bedtime.      . hydroxypropyl methylcellulose (ISOPTO TEARS) 2.5 % ophthalmic solution Place 1 drop into both eyes daily as needed. For dry eyes      . Multiple  Vitamin (MULTIVITAMIN) tablet Take 1 tablet by mouth daily.      . Omega-3 Fatty Acids (FISH OIL) 1200 MG CAPS Take 1 capsule by mouth 2 (two) times daily.       Marland Kitchen omeprazole (PRILOSEC) 20 MG capsule Take 20 mg by mouth 2 (two) times daily.      . sertraline (ZOLOFT) 50 MG tablet Take 50 mg by mouth at bedtime.       . traMADol (ULTRAM) 50 MG tablet Take 50 mg by mouth every 6 (six) hours as needed for pain.      . traZODone (DESYREL) 50 MG tablet Take 50 mg by mouth daily as needed. For sleep      . nebivolol (BYSTOLIC) 5 MG tablet Take 5 mg by mouth daily. Uses on a as needed basis, has not used since 2011        No results found for this or any previous visit (from the past 48 hour(s)). No results found.  Review of Systems  Constitutional: Negative.   HENT: Negative.   Eyes: Negative.   Respiratory: Negative.   Cardiovascular: Negative.    Gastrointestinal: Negative.   Genitourinary: Negative.   Musculoskeletal: Positive for myalgias and back pain.  Skin: Negative.   Neurological: Positive for sensory change.  Endo/Heme/Allergies: Negative.   Psychiatric/Behavioral: Negative.     Blood pressure 134/82, pulse 55, temperature 97.8 F (36.6 C), temperature source Oral, resp. rate 20, SpO2 96.00%. Physical Exam  Constitutional: He is oriented to person, place, and time. He appears well-developed.  HENT:  Head: Normocephalic.  Eyes: Pupils are equal, round, and reactive to light.  Neck: Normal range of motion.  Cardiovascular: Normal rate.   Respiratory: Effort normal.  GI: Soft.  Musculoskeletal: Normal range of motion.  Neurological: He is alert and oriented to person, place, and time. He has normal strength. He displays a negative Romberg sign. GCS eye subscore is 4. GCS verbal subscore is 5. GCS motor subscore is 6.  Reflex Scores:      Tricep reflexes are 2+ on the right side and 2+ on the left side.      Bicep reflexes are 2+ on the right side and 2+ on the left side.      Brachioradialis reflexes are 2+ on the right side and 2+ on the left side.      Patellar reflexes are 0 on the right side and 1+ on the left side.      Achilles reflexes are 0 on the right side and 1+ on the left side. Strength is 5 out of 5 in his iliopsoas, quads, and she's, gastrocs, into tibialis, EHL.     Assessment/Plan 67 years and presents for right-sided L3-4 decompressive laminectomy for resection of synovial cyst  Varian Innes,Arhum P 12/09/2012, 7:11 AM

## 2012-12-09 NOTE — Op Note (Signed)
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  EBL: Minimal  History of present illness: Patient is a very pleasant 67 year old some is a PreserVision back and right leg pain with numbness tingling weakness in his proximal right leg in his quadriceps workup revealed a large synovial cyst L3 for the right causing severe thecal sac compression severe spinal stenosis and displacement of the right L4 nerve root due to patient's failure of conservative treatment imaging findings progression of clinical syndrome I recommended decompressive laminectomy for resection of synovial cyst. I extensively reviewed the risks and benefits of the operation with the patient as well as perioperative course and expectations of outcome and alternatives surgery and he understands and agrees to proceed forward.  Operative procedure: Patient brought into the or was induced under general anesthesia positioned prone the Wilson frame his back was prepped and draped in routine sterile fashion. Preoperative x-ray confirmed the appropriate level so after infiltration of 10 cc lidocaine with epi a midline incision was made and Bovie light cautery was used to take down the subcutaneous tissues and subperiosteal dissections care lamina of L L3 and L4 on the right interoperative x-ray confirmed the appropriate level so a high-speed drill was used to drill down and the interest in the lamina of L3 the facet complexes suppressant of L4 and ended extends up to include virtually all lamina of L3. Laminotomy was begun with a 2 and may Kerrison punch immediately identified was a very dark and grayish appearing ligament flavum this  was removed in piecemeal fashion identify the native dura just inferior to the space underlying the L4 lamina. Margins superiorly I developed a plane underneath the cyst from the dura which was densely adherent and the dura had discolored secondary to be appearance. Working in the cyst I saw bunch of hemorrhagic degenerated cyst contents these were all teased away with I nerve hook and therefore in Kerrison rongeur. I got to the super aspect of the cyst peeled off the dura and then worked underneath the medial gutter aggressively under been the medial gutter removing all cyst wall. The L4 nerve root was identified the L4 nerve root was dissected off the L4 pedicle to confirm adequate resection of the cyst the lateral margins of the thecal sac and spinal canal. After adequate inspection I palpation with a coronary dilator nerve hook and angle hockey-stick.I. confirmed complete resection of the cyst wide decompression of the L4 foramen and central canal stenosis. I palpated no additional cyst medially cephalad or caudally. There was a copiously irrigated meticulous hemostasis was maintained Gelfoam was laid up the dura the muscle fascia proximal in interrupted Vicryl and the skin was closed running 4 subcuticular benzo and Steri-Strips were applied patient recovered in stable condition the case on it counts and sponge counts were correct.

## 2012-12-09 NOTE — Transfer of Care (Signed)
Immediate Anesthesia Transfer of Care Note  Patient: David Mcgee  Procedure(s) Performed: Procedure(s) with comments: LUMBAR LAMINECTOMY/DECOMPRESSION MICRODISCECTOMY 1 LEVEL (Right) - LUMBAR LAMINECTOMY/DECOMPRESSION MICRODISCECTOMY 1 LEVEL  Patient Location: PACU  Anesthesia Type:General  Level of Consciousness: awake, alert  and oriented  Airway & Oxygen Therapy: Patient Spontanous Breathing and Patient connected to nasal cannula oxygen  Post-op Assessment: Report given to PACU RN, Post -op Vital signs reviewed and stable and Patient moving all extremities  Post vital signs: Reviewed and stable  Complications: No apparent anesthesia complications

## 2012-12-09 NOTE — Plan of Care (Signed)
Problem: Consults Goal: Diagnosis - Spinal Surgery Outcome: Completed/Met Date Met:  12/09/12 Lumbar Laminectomy (Complex)

## 2012-12-09 NOTE — Preoperative (Signed)
Beta Blockers   Reason not to administer Beta Blockers:Not Applicable 

## 2012-12-09 NOTE — Anesthesia Procedure Notes (Signed)
Procedure Name: Intubation Date/Time: 12/09/2012 7:32 AM Performed by: Charm Barges, Lorijean Husser R Pre-anesthesia Checklist: Patient identified, Emergency Drugs available, Suction available, Patient being monitored and Timeout performed Patient Re-evaluated:Patient Re-evaluated prior to inductionOxygen Delivery Method: Circle system utilized Preoxygenation: Pre-oxygenation with 100% oxygen Intubation Type: IV induction Ventilation: Oral airway inserted - appropriate to patient size and Mask ventilation without difficulty Laryngoscope Size: Mac and 4 Grade View: Grade II Tube type: Oral Tube size: 8.0 mm Number of attempts: 1 Airway Equipment and Method: Stylet Placement Confirmation: ETT inserted through vocal cords under direct vision,  positive ETCO2 and breath sounds checked- equal and bilateral Secured at: 24 cm Tube secured with: Tape Dental Injury: Teeth and Oropharynx as per pre-operative assessment

## 2012-12-10 NOTE — Discharge Summary (Signed)
  Physician Discharge Summary  Patient ID: David Mcgee MRN: [id number] DOB/AGE: 12-05-1945 67 y.o.  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 *   Discharged Condition: good  Hospital Course: Patient is a middle school underwent decompressive laminectomy on the right at L3-4 for resection of a large synovial cyst postop patient did very well with recovered in the floor on the floor patient was convalescing well ambulating and voiding spontaneously tolerating a regular diet and stable enough for discharge home.  Consults: Significant Diagnostic Studies: Treatments: Decompressive lumbar laminectomy L3-4 right Discharge Exam: Blood pressure 97/57, pulse 65, temperature 98.8 F (37.1 C), temperature source Oral, resp. rate 16, SpO2 95.00%. Strength out of 5 wound clean and dry  Disposition: Home     Medication List         amLODipine 10 MG tablet  Commonly known as:  NORVASC  Take 10 mg by mouth at bedtime. * Needs office visit for any additional refills*     aspirin 81 MG tablet  Take 81 mg by mouth at bedtime.     atorvastatin 10 MG tablet  Commonly known as:  LIPITOR  Take 10 mg by mouth at bedtime.     Fish Oil 1200 MG Caps  Take 1 capsule by mouth 2 (two) times daily.     hydroxypropyl methylcellulose 2.5 % ophthalmic solution  Commonly known as:  ISOPTO TEARS  Place 1 drop into both eyes daily as needed. For dry eyes     multivitamin tablet  Take 1 tablet by mouth daily.     nebivolol 5 MG tablet  Commonly known as:  BYSTOLIC  Take 5 mg by mouth daily. Uses on a as needed basis, has not used since 2011     omeprazole 20 MG capsule  Commonly known as:  PRILOSEC  Take 20 mg by mouth 2 (two) times daily.     sertraline 50 MG tablet  Commonly known as:  ZOLOFT  Take 50 mg  by mouth at bedtime.     traMADol 50 MG tablet  Commonly known as:  ULTRAM  Take 50 mg by mouth every 6 (six) hours as needed for pain.     traZODone 50 MG tablet  Commonly known as:  DESYREL  Take 50 mg by mouth daily as needed. For sleep           Follow-up Information   Follow up with Giabella Duhart,Daimien P, MD.   Contact information:   [address] [place] [address] [phone]       Signed: Jacoya Bauman,Ryun P 12/10/2012, 7:13 AM

## 2012-12-10 NOTE — Progress Notes (Signed)
Pt. Alert and oriented,follows simple instructions, denies pain. Incision area without swelling, redness or S/S of infection. Voiding adequate clear yellow urine. Moving all extremities well and vitals stable and documented. Lumbar surgery notes instructions given to patient and family member for home safety and precautions. Pt. and family stated understanding of instructions given.  

## 2012-12-10 NOTE — Progress Notes (Signed)
Patient ID: David Mcgee, male   DOB: 11-Oct-1945, 67 y.o.   MRN: [id number] Patient is doing well no leg pain very minimal back stiffness stable for discharge

## 2012-12-12 ENCOUNTER — Encounter (HOSPITAL_COMMUNITY): Payer: Self-pay | Admitting: Neurosurgery

## 2012-12-30 ENCOUNTER — Other Ambulatory Visit: Payer: Self-pay | Admitting: *Deleted

## 2012-12-30 MED ORDER — OMEPRAZOLE 20 MG PO CPDR: 20.0000 mg | DELAYED_RELEASE_CAPSULE | Freq: Two times a day (BID) | ORAL | Status: DC

## 2013-01-22 ENCOUNTER — Other Ambulatory Visit: Payer: Self-pay | Admitting: *Deleted

## 2013-01-22 MED ORDER — AMLODIPINE BESYLATE 10 MG PO TABS: 10.0000 mg | ORAL_TABLET | Freq: Every day | ORAL | Status: DC

## 2013-01-23 ENCOUNTER — Ambulatory Visit (INDEPENDENT_AMBULATORY_CARE_PROVIDER_SITE_OTHER): Payer: Medicare Other | Admitting: Family Medicine

## 2013-01-23 ENCOUNTER — Encounter: Payer: Self-pay | Admitting: Family Medicine

## 2013-01-23 VITALS — BP 128/84 | HR 56 | Temp 97.9°F | Ht 73.25 in | Wt 202.8 lb

## 2013-01-23 DIAGNOSIS — I10 Essential (primary) hypertension: Secondary | ICD-10-CM

## 2013-01-23 DIAGNOSIS — Z0000 Encounter for general adult medical examination without abnormal findings: Secondary | ICD-10-CM

## 2013-01-23 DIAGNOSIS — F4310 Post-traumatic stress disorder, unspecified: Secondary | ICD-10-CM

## 2013-01-23 DIAGNOSIS — K219 Gastro-esophageal reflux disease without esophagitis: Secondary | ICD-10-CM

## 2013-01-23 MED ORDER — OMEPRAZOLE 20 MG PO CPDR: 20.0000 mg | DELAYED_RELEASE_CAPSULE | Freq: Two times a day (BID) | ORAL | Status: DC

## 2013-01-23 MED ORDER — AMLODIPINE BESYLATE 10 MG PO TABS: 10.0000 mg | ORAL_TABLET | Freq: Every day | ORAL | Status: DC

## 2013-01-23 NOTE — Progress Notes (Signed)
I have personally reviewed the Medicare Annual Wellness questionnaire and have noted 1. The patient's medical and social history 2. Their use of alcohol, tobacco or illicit drugs 3. Their current medications and supplements 4. The patient's functional ability including ADL's, fall risks, home safety risks and hearing or visual             impairment. 5. Diet and physical activities 6. Evidence for depression or mood disorders  The patients weight, height, BMI have been recorded in the chart and visual acuity is per eye clinic.  I have made referrals, counseling and provided education to the patient based review of the above and I have provided the pt with a written personalized care plan for preventive services.  See scanned forms.  Routine anticipatory guidance given to patient.  See health maintenance. Flu done at pharmacy Shingles encoruaged PNA prev done Tetanus 2010 [facility], pending [facility]state cancer screening possibly done at [facility].  He'll get me his labs from them.  Prostate cancer screening and PSA options (with potential risks and benefits of testing vs not testing) were discussed along with recent recs/guidelines.  He declined testing PSA at this point. Advance directive d/wpt.  Wife designated if he is incapacitated.  Cognitive function addressed- see scanned forms- and if abnormal then additional documentation follows.   GERD controlled with current meds.  No ADE.  Would like to continue.   Hypertension:    Using medication without problems or lightheadedness: yes Chest pain with exertion:no Edema:no Short of breath:no  PTSD per psych. No SI/HI.  This continues to be an issue for patient and he is trying to work through this.  He is compliant with meds.   PMH and SH reviewed  Meds, vitals, and allergies reviewed.   ROS: See HPI.  Otherwise negative.    GEN: nad, alert and oriented HEENT: mucous membranes moist NECK: supple w/o LA CV: rrr. PULM: ctab, no  inc wob ABD: soft, +bs EXT: no edema SKIN: no acute rash

## 2013-01-23 NOTE — Patient Instructions (Addendum)
Check with your insurance to see if they will cover the shingles shot. I would get a flu shot each fall.   Get me a copy of your labs from the [place].   Check on AAA screening at the [place] or here.  Take care.  Glad to see you.

## 2013-01-24 DIAGNOSIS — F4310 Post-traumatic stress disorder, unspecified: Secondary | ICD-10-CM | POA: Insufficient documentation

## 2013-01-24 NOTE — Assessment & Plan Note (Signed)
See scanned forms.  Routine anticipatory guidance given to patient.  See health maintenance. Flu done at pharmacy Shingles encoruaged PNA prev done Tetanus 2010 [facility], pending [facility]state cancer screening possibly done at [facility].  He'll get me his labs from them.  Prostate cancer screening and PSA options (with potential risks and benefits of testing vs not testing) were discussed along with recent recs/guidelines.  He declined testing PSA at this point. Advance directive d/wpt.  Wife designated if he is incapacitated.  Cognitive function addressed- see scanned forms- and if abnormal then additional documentation follows.

## 2013-01-24 NOTE — Assessment & Plan Note (Signed)
Per psych clinic.  No SI/HI.

## 2013-01-24 NOTE — Assessment & Plan Note (Signed)
Controlled, continue diet and exercise, continue current meds.  He'll [facility].

## 2013-01-24 NOTE — Assessment & Plan Note (Signed)
Controlled, continue current meds.   

## 2013-01-27 ENCOUNTER — Telehealth: Payer: Self-pay | Admitting: Family Medicine

## 2013-01-27 DIAGNOSIS — I10 Essential (primary) hypertension: Secondary | ICD-10-CM

## 2013-01-27 DIAGNOSIS — Z136 Encounter for screening for cardiovascular disorders: Secondary | ICD-10-CM

## 2013-01-27 NOTE — Telephone Encounter (Signed)
Call Pt.  PSA was fine.  Other labs okay. I didn't see lipids or sugar/renal tests.  [facility] will [facility] those- have him send them to me when done.  O/w we can check them here.  Let me know if he needs an order.  Thanks.

## 2013-01-27 NOTE — Telephone Encounter (Signed)
Left message on patient's voicemail to return call

## 2013-01-28 NOTE — Telephone Encounter (Signed)
Patient advised.   He does want to get his lipids, sugar and renal tests done here and scheduled lab appt for Friday, 01/31/13.   1) Please order labs. 2) Patient says you mentioned an [place] to check for AAA.  He would like to get that scheduled as well.  Please do referral.

## 2013-01-29 NOTE — Addendum Note (Signed)
Addended by: Joaquim Nam on: 01/29/2013 07:38 AM   Modules accepted: Orders

## 2013-01-29 NOTE — Telephone Encounter (Signed)
Ordered

## 2013-01-29 NOTE — Telephone Encounter (Signed)
Left message for patient to call back  

## 2013-01-31 ENCOUNTER — Other Ambulatory Visit (INDEPENDENT_AMBULATORY_CARE_PROVIDER_SITE_OTHER): Payer: Medicare Other

## 2013-01-31 DIAGNOSIS — I10 Essential (primary) hypertension: Secondary | ICD-10-CM

## 2013-01-31 DIAGNOSIS — E781 Pure hyperglyceridemia: Secondary | ICD-10-CM

## 2013-01-31 DIAGNOSIS — E785 Hyperlipidemia, unspecified: Secondary | ICD-10-CM

## 2013-01-31 LAB — LIPID PANEL
Cholesterol: 123 mg/dL (ref 0–200)
LDL Cholesterol: 74 mg/dL (ref 0–99)
Triglycerides: 54 mg/dL (ref 0.0–149.0)
VLDL: 10.8 mg/dL (ref 0.0–40.0)

## 2013-01-31 LAB — BASIC METABOLIC PANEL
BUN: 17 mg/dL (ref 6–23)
CO2: 31 mEq/L (ref 19–32)
Glucose, Bld: 100 mg/dL — ABNORMAL HIGH (ref 70–99)

## 2013-02-03 ENCOUNTER — Encounter: Payer: Self-pay | Admitting: *Deleted

## 2013-03-19 ENCOUNTER — Encounter (INDEPENDENT_AMBULATORY_CARE_PROVIDER_SITE_OTHER): Payer: Medicare Other

## 2013-03-19 DIAGNOSIS — Z136 Encounter for screening for cardiovascular disorders: Secondary | ICD-10-CM

## 2013-03-19 DIAGNOSIS — I10 Essential (primary) hypertension: Secondary | ICD-10-CM

## 2013-03-20 ENCOUNTER — Encounter: Payer: Self-pay | Admitting: *Deleted

## 2013-06-13 ENCOUNTER — Other Ambulatory Visit: Payer: Self-pay | Admitting: *Deleted

## 2013-06-13 MED ORDER — ATORVASTATIN CALCIUM 10 MG PO TABS: 10.0000 mg | ORAL_TABLET | Freq: Every day | ORAL | Status: DC

## 2013-06-13 MED ORDER — OMEPRAZOLE 20 MG PO CPDR: 20.0000 mg | DELAYED_RELEASE_CAPSULE | Freq: Two times a day (BID) | ORAL | Status: DC

## 2013-06-13 MED ORDER — AMLODIPINE BESYLATE 10 MG PO TABS: 10.0000 mg | ORAL_TABLET | Freq: Every day | ORAL | Status: DC

## 2013-12-01 ENCOUNTER — Ambulatory Visit (INDEPENDENT_AMBULATORY_CARE_PROVIDER_SITE_OTHER): Payer: Medicare HMO | Admitting: Cardiovascular Disease

## 2013-12-01 ENCOUNTER — Encounter: Payer: Self-pay | Admitting: Cardiovascular Disease

## 2013-12-01 VITALS — BP 125/70 | HR 55 | Ht 74.5 in | Wt 215.2 lb

## 2013-12-01 DIAGNOSIS — I10 Essential (primary) hypertension: Secondary | ICD-10-CM

## 2013-12-01 DIAGNOSIS — E785 Hyperlipidemia, unspecified: Secondary | ICD-10-CM

## 2013-12-01 DIAGNOSIS — R002 Palpitations: Secondary | ICD-10-CM

## 2013-12-01 NOTE — Patient Instructions (Signed)
You are doing well. No medication changes were made.  Please call us if you have new issues that need to be addressed before your next appt.  Your physician wants you to follow-up in: 12 months.  You will receive a reminder letter in the mail two months in advance. If you don't receive a letter, please call our office to schedule the follow-up appointment. 

## 2013-12-01 NOTE — Assessment & Plan Note (Signed)
Blood pressure is well controlled on today's visit. No changes made to the medications. 

## 2013-12-01 NOTE — Progress Notes (Signed)
Patient ID: David Mcgee, male    DOB: 07/19/1945, 68 y.o.   MRN: [id number]  HPI Comments: David Mcgee is a very pleasant 68 year old gentleman, patient of Dr. Council Mechanic, with a history of arthritis, hypertension,  tachycardia and palpitations  who presents for routine followup.   Overall he reports that he has been doing well. No  chest pain or SOB. He is active, rides his motorcycle frequently.  No regular exercise program History of left total knee replacement surgery earlier in 2014 and recovered well. Does have some discomfort in the right knee.  On previous office visit, he reported having a cyst on his spine, s/p surgery to have this removed. Previously had Some radiating pain down his right leg.  He is not taking any beta blocker secondary to bradycardia Rare ectopy.  EKG today shows sinus bradycardia with rate 55 beats per minute with nonspecific T wave abnormality in the anterior precordial leads    Outpatient Encounter Prescriptions as of 12/01/2013  Medication Sig  . amLODipine (NORVASC) 10 MG tablet Take 1 tablet (10 mg total) by mouth at bedtime.  Marland Kitchen aspirin 81 MG tablet Take 81 mg by mouth at bedtime.   Marland Kitchen atorvastatin (LIPITOR) 10 MG tablet Take 1 tablet (10 mg total) by mouth at bedtime.  . hydroxypropyl methylcellulose (ISOPTO TEARS) 2.5 % ophthalmic solution Place 1 drop into both eyes daily as needed. For dry eyes  . Multiple Vitamin (MULTIVITAMIN) tablet Take 1 tablet by mouth daily.  . Omega-3 Fatty Acids (FISH OIL) 1200 MG CAPS Take 1 capsule by mouth 2 (two) times daily.   Marland Kitchen omeprazole (PRILOSEC) 20 MG capsule Take 1 capsule (20 mg total) by mouth 2 (two) times daily.  . sertraline (ZOLOFT) 50 MG tablet Take 50 mg by mouth at bedtime.   . traMADol (ULTRAM) 50 MG tablet Take 50 mg by mouth every 6 (six) hours as needed for pain.  . traZODone (DESYREL) 50 MG tablet Take 50 mg by mouth daily as needed. For sleep    Review of Systems  Constitutional: Negative.   HENT:  Negative.   Eyes: Negative.   Respiratory: Negative.   Cardiovascular: Negative.   Gastrointestinal: Negative.   Endocrine: Negative.   Musculoskeletal: Negative.   Skin: Negative.   Allergic/Immunologic: Negative.   Neurological: Negative.   Hematological: Negative.   Psychiatric/Behavioral: Negative.   All other systems reviewed and are negative.   BP 146/82  Pulse 55  Ht 6' 2.5" (1.892 m)  Wt 215 lb 4 oz (97.637 kg)  BMI 27.28 kg/m2  Physical Exam  Nursing note and vitals reviewed. Constitutional: He is oriented to person, place, and time. He appears well-developed and well-nourished.  HENT:  Head: Normocephalic.  Nose: Nose normal.  Mouth/Throat: Oropharynx is clear and moist.  Eyes: Conjunctivae are normal. Pupils are equal, round, and reactive to light.  Neck: Normal range of motion. Neck supple. No JVD present.  Cardiovascular: Normal rate, regular rhythm, S1 normal, S2 normal, normal heart sounds and intact distal pulses.  Exam reveals no gallop and no friction rub.   No murmur heard. Pulmonary/Chest: Effort normal and breath sounds normal. No respiratory distress. He has no wheezes. He has no rales. He exhibits no tenderness.  Abdominal: Soft. Bowel sounds are normal. He exhibits no distension. There is no tenderness.  Musculoskeletal: Normal range of motion. He exhibits no edema and no tenderness.  Lymphadenopathy:    He has no cervical adenopathy.  Neurological: He is alert and oriented  to person, place, and time. Coordination normal.  Skin: Skin is warm and dry. No rash noted. No erythema.  Psychiatric: He has a normal mood and affect. His behavior is normal. Judgment and thought content normal.      Assessment and Plan

## 2013-12-01 NOTE — Assessment & Plan Note (Signed)
Cholesterol is at goal on the current lipid regimen. No changes to the medications were made.  

## 2013-12-01 NOTE — Assessment & Plan Note (Signed)
He denies having any symptomatic palpitations. Unable to tolerate beta blocker secondary to bradycardia

## 2014-02-23 ENCOUNTER — Encounter: Payer: Self-pay | Admitting: Family Medicine

## 2014-02-23 ENCOUNTER — Ambulatory Visit (INDEPENDENT_AMBULATORY_CARE_PROVIDER_SITE_OTHER): Payer: Commercial Managed Care - HMO | Admitting: Family Medicine

## 2014-02-23 ENCOUNTER — Telehealth: Payer: Self-pay

## 2014-02-23 VITALS — BP 126/84 | HR 82 | Temp 101.2°F | Ht 74.5 in | Wt 229.0 lb

## 2014-02-23 DIAGNOSIS — R079 Chest pain, unspecified: Secondary | ICD-10-CM

## 2014-02-23 DIAGNOSIS — H6091 Unspecified otitis externa, right ear: Secondary | ICD-10-CM

## 2014-02-23 DIAGNOSIS — R0789 Other chest pain: Secondary | ICD-10-CM

## 2014-02-23 DIAGNOSIS — N390 Urinary tract infection, site not specified: Secondary | ICD-10-CM

## 2014-02-23 DIAGNOSIS — R319 Hematuria, unspecified: Secondary | ICD-10-CM

## 2014-02-23 DIAGNOSIS — H6090 Unspecified otitis externa, unspecified ear: Secondary | ICD-10-CM | POA: Insufficient documentation

## 2014-02-23 LAB — POCT URINALYSIS DIPSTICK
Glucose, UA: NEGATIVE
NITRITE UA: NEGATIVE
PH UA: 5.5
Spec Grav, UA: >=1.03
Urobilinogen, UA: 2

## 2014-02-23 MED ORDER — CIPROFLOXACIN HCL 500 MG PO TABS: 500.0000 mg | ORAL_TABLET | Freq: Two times a day (BID) | ORAL | Status: DC

## 2014-02-23 NOTE — Assessment & Plan Note (Signed)
This looks to be resolving after pt rinsed ears with peroxide  Also being tx with systemic cipro for uti today Update if not starting to improve in a week or if worsening   Adv to keep water out of his ears

## 2014-02-23 NOTE — Progress Notes (Signed)
Pre visit review using our clinic review tool, if applicable. No additional management support is needed unless otherwise documented below in the visit note. 

## 2014-02-23 NOTE — Telephone Encounter (Signed)
Seeing him now.

## 2014-02-23 NOTE — Progress Notes (Signed)
Subjective:    Patient ID: David Mcgee, male    DOB: 04-Apr-1946, 68 y.o.   MRN: [id number]  HPI Here with urinary symptoms  Also ear pain and fever   Also chest pain - looks stable from last time He has seen cardiology   Symptoms started 3-4 days ago  Started with urinary infection  Has had a kidney stone in the past  Bad burning when he goes to the bathroom - and it radiates up into body/ chest and arms  Chills last night - took tylenol - 102 max  He started seeing blood in his urine 3 d ago  No back or flank pain  Does get nauseated  His urologist is Dr Betti Cruz - has been many years ago   Results for orders placed in visit on 02/23/14  POCT URINALYSIS DIPSTICK      Result Value Ref Range   Color, UA red     Clarity, UA cloudy     Glucose, UA neg     Bilirubin, UA Trace     Ketones, UA Trace     Spec Grav, UA >=1.030     Blood, UA Large     pH, UA 5.5     Protein, UA 100++     Urobilinogen, UA 2.0     Nitrite, UA neg     Leukocytes, UA Trace     not on any nsaids except for 81 mg asa   Lab Results  Component Value Date   CREATININE 1.2 01/31/2013   BUN 17 01/31/2013   NA 139 01/31/2013   K 4.2 01/31/2013   CL 104 01/31/2013   CO2 31 01/31/2013   pt states he has lost some function in one kidney in the past    Also having ear pain  R ear  Feels a bit like swimmers ear  Using some peroxide and syringe  No reason for it to act up-has not been swimming  Feels a "flutter" in his ear and hears a noise in it  No uri symptoms   Patient Active Problem List   Diagnosis Date Noted  . UTI (lower urinary tract infection) 02/23/2014  . Otitis externa 02/23/2014  . PTSD (post-traumatic stress disorder) 01/24/2013  . Hyperlipidemia 11/16/2011  . Medicare annual wellness visit, subsequent 10/24/2011  . Prostate cancer screening 10/24/2011  . PALPITATIONS 06/10/2009  . LOW BACK PAIN, ACUTE 01/01/2009  . CARCINOMA, BASAL CELL 10/01/2008  . 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 HYPERTRIGLYCERIDEMIA 10/03/2007  . HYPERTENSION, ESSENTIAL 10/03/2007  . GERD 10/03/2007  . Knee pain 10/03/2007   Past Medical History  Diagnosis Date  . Palpitations   . GERD (gastroesophageal reflux disease)   . Insomnia   . PTSD (post-traumatic stress disorder)   . Cancer     Skin, not melanoma  . Spinal cord cysts   . Hypertension   . History of stress test     long time ago- wnl  . Depression     ? panic attacks, takes sertraline & also sees psych q 3 months   . 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 H/O hiatal hernia   . Arthritis     back, knees, hands - OA  . PTSD (post-traumatic stress disorder)     post [place]   Past Surgical History  Procedure Laterality Date  . Neuroma surgery  Right foot Morton's neuroma excision  . Knee arthroscopy  10/2000    Right Knee (Dr. Sabra Heck)  . Hernoiorraphy      Right; years ago  . Esophagogastroduodenoscopy  06/1995    Dilitation bx negative (Dr. Epifanio Lesches)  . Total knee arthroplasty    . Joint replacement  05/2012    L  . Tonsillectomy    . Lumbar laminectomy/decompression microdiscectomy Right 12/09/2012    Procedure: LUMBAR LAMINECTOMY/DECOMPRESSION MICRODISCECTOMY 1 LEVEL;  Surgeon: Elaina Hoops, MD;  Location: [facility];  Service: Neurosurgery;  Laterality: Right;  LUMBAR LAMINECTOMY/DECOMPRESSION MICRODISCECTOMY 1 LEVEL   History  Substance Use Topics  . Smoking status: Former Smoker -- 1.50 packs/day for 9 years    Types: Cigarettes    Quit date: 05/22/1965  . Smokeless tobacco: Never Used     Comment: quit in 1972  . Alcohol Use: No   Family History  Problem Relation Age of Onset  . Hypertension Mother   . Diabetes Mother   . Kidney failure Father   . Hypertension Father   . Obesity Sister   . Diabetes Sister   . Diabetes Brother   . Hypertension Brother   . Hyperlipidemia Brother   . Stroke Brother   . Depression Brother     ?    . Colon cancer Neg Hx   . Prostate cancer Neg Hx    Allergies  Allergen Reactions  . Azithromycin Nausea Only   Current Outpatient Prescriptions on File Prior to Visit  Medication Sig Dispense Refill  . amLODipine (NORVASC) 10 MG tablet Take 1 tablet (10 mg total) by mouth at bedtime.  90 tablet  3  . aspirin 81 MG tablet Take 81 mg by mouth at bedtime.       Marland Kitchen atorvastatin (LIPITOR) 10 MG tablet Take 1 tablet (10 mg total) by mouth at bedtime.  90 tablet  3  . hydroxypropyl methylcellulose (ISOPTO TEARS) 2.5 % ophthalmic solution Place 1 drop into both eyes daily as needed. For dry eyes      . Multiple Vitamin (MULTIVITAMIN) tablet Take 1 tablet by mouth daily.      . Omega-3 Fatty Acids (FISH OIL) 1200 MG CAPS Take 1 capsule by mouth 2 (two) times daily.       Marland Kitchen omeprazole (PRILOSEC) 20 MG capsule Take 1 capsule (20 mg total) by mouth 2 (two) times daily.  180 capsule  3  . sertraline (ZOLOFT) 50 MG tablet Take 50 mg by mouth at bedtime.       . traMADol (ULTRAM) 50 MG tablet Take 50 mg by mouth every 6 (six) hours as needed for pain.      . traZODone (DESYREL) 50 MG tablet Take 50 mg by mouth daily as needed. For sleep       No current facility-administered medications on file prior to visit.    Review of Systems Review of Systems  Constitutional: pos for fever/chills /malaise  ENT pos for R ear pain / neg for cong and sinus pain  Eyes: Negative for pain and visual disturbance.  Respiratory: Negative for cough and shortness of breath.   Cardiovascular: Negative for or palpitations   pos for cp (referred from dysuria sensation only when urinating) Gastrointestinal: Negative for  diarrhea and constipation.  Genitourinary: pos for urgency and frequency. pos for dysuria and hematuria , neg for flank pain  Skin: Negative for pallor or rash   Neurological: Negative for weakness, light-headedness, numbness and headaches.  Hematological: Negative  for adenopathy. Does not bruise/bleed  easily.  Psychiatric/Behavioral: Negative for dysphoric mood. The patient is not nervous/anxious.         Objective:   Physical Exam  Constitutional: He appears well-developed and well-nourished. No distress.  HENT:  Head: Normocephalic and atraumatic.  Left Ear: External ear normal.  Nose: Nose normal.  Mouth/Throat: Oropharynx is clear and moist. No oropharyngeal exudate.  slt injection of R ear canal Nl TMs bilat   No sinus tenderness  Eyes: Conjunctivae and EOM are normal. Pupils are equal, round, and reactive to light. Right eye exhibits no discharge. Left eye exhibits no discharge. No scleral icterus.  Neck: Normal range of motion. Neck supple.  Cardiovascular: Normal rate, regular rhythm, normal heart sounds and intact distal pulses.   Pulmonary/Chest: Effort normal and breath sounds normal. No respiratory distress. He has no wheezes. He has no rales. He exhibits no tenderness.  Abdominal: Soft. Bowel sounds are normal. He exhibits no distension. There is no hepatosplenomegaly. There is tenderness in the suprapubic area. There is no rigidity, no rebound, no guarding, no CVA tenderness, no tenderness at McBurney's point and negative Murphy's sign.  Musculoskeletal: He exhibits no edema.  Lymphadenopathy:    He has no cervical adenopathy.  Neurological: He is alert.  Skin: Skin is warm and dry. No rash noted. No erythema. No pallor.  Psychiatric: He has a normal mood and affect.          Assessment & Plan:   Problem List Items Addressed This Visit     Nervous and Auditory   Otitis externa     This looks to be resolving after pt rinsed ears with peroxide  Also being tx with systemic cipro for uti today Update if not starting to improve in a week or if worsening   Adv to keep water out of his ears       Genitourinary   UTI (lower urinary tract infection)     With hematuria and dysuria  cx urine  tx with cipro 500 mg bid  Enc to push fluids No flank pain - but if  no imp in hematuria consider CT abd/pelvis (hx of renal stones) F/u with PCP later this week but update if worse in the meantime     Relevant Orders      Urine culture     Other   Atypical chest pain     Pt mentions he gets chest pain when he urinates - like a radiation of pain from bladder No acute changes on EKG from last one -he does have T wave changes and inversions (states cardiology is aware) Will alert [place] if symptoms worsen     Other Visit Diagnoses   Chest pain, unspecified chest pain type    -  Primary    Relevant Orders       EKG 12-Lead (Completed)    Hematuria        Relevant Orders       POCT urinalysis dipstick (Completed)       Urine culture

## 2014-02-23 NOTE — Patient Instructions (Addendum)
Drink lots of water  Tylenol for fever  Take the cipro as directed  We will send urine for a culture  If symptoms worsen or fever does not come down - please let us know or seek care in the ER  Peroxide in the ear is ok if it gives you some relief  Follow up with Dr Damita Dunnings later this week

## 2014-02-23 NOTE — Telephone Encounter (Signed)
Pt walked in; for 1 1/2 weeks has had discomfort when urinating, last 2 - 3 days pt has burning upon urination with frequency and blood in urine, upper abd pain near waistline that on and off goes into chest and down lt arm when has a lot of pain urinating. Nausea but no vomiting.02/20/14 started on and off earache. Pt had temp 102 last night, has not cked temp today. Dr Glori Bickers will see at end of day; pt does not want to go to UC.

## 2014-02-23 NOTE — Assessment & Plan Note (Signed)
With hematuria and dysuria  cx urine  tx with cipro 500 mg bid  Enc to push fluids No flank pain - but if no imp in hematuria consider CT abd/pelvis (hx of renal stones) F/u with PCP later this week but update if worse in the meantime

## 2014-02-23 NOTE — Assessment & Plan Note (Signed)
Pt mentions he gets chest pain when he urinates - like a radiation of pain from bladder No acute changes on EKG from last one -he does have T wave changes and inversions (states cardiology is aware) Will alert [place] if symptoms worsen

## 2014-02-25 LAB — URINE CULTURE
COLONY COUNT: NO GROWTH
Organism ID, Bacteria: NO GROWTH

## 2014-02-26 ENCOUNTER — Telehealth: Payer: Self-pay | Admitting: Family Medicine

## 2014-02-26 NOTE — Telephone Encounter (Signed)
Addressed through lab results  

## 2014-02-26 NOTE — Telephone Encounter (Signed)
Pt came by to check on results of labs but just said that he would get those results and speak with Dr Damita Dunnings about this at his visit in the morning.

## 2014-02-26 NOTE — Telephone Encounter (Signed)
Per last phone note he asked that we leave a detailed voicemail regarding his lab results if he didn't answer and that is what I did

## 2014-02-26 NOTE — Telephone Encounter (Signed)
Patient returned your call.  Patient asked for you to leave a detailed message on his voice mail.

## 2014-02-27 ENCOUNTER — Encounter: Payer: Self-pay | Admitting: Family Medicine

## 2014-02-27 ENCOUNTER — Ambulatory Visit (INDEPENDENT_AMBULATORY_CARE_PROVIDER_SITE_OTHER): Payer: Medicare HMO | Admitting: Family Medicine

## 2014-02-27 VITALS — BP 120/72 | HR 56 | Temp 98.0°F | Wt 218.5 lb

## 2014-02-27 DIAGNOSIS — N390 Urinary tract infection, site not specified: Secondary | ICD-10-CM

## 2014-02-27 NOTE — Progress Notes (Signed)
Pre visit review using our clinic review tool, if applicable. No additional management support is needed unless otherwise documented below in the visit note.  Prev with fever and blood in urine. Pain with urination, burning, coming up into his chest and arms.  Urine was rust colored on last OV, not since.  Started on cipro, ucx neg.  Urine had been variably colored in the last few days.  No gross blood.  No more fevers now, gradually better, last fever was day before yesterday.   Off tylenol.  No vomiting, no diarrhea.  No renal stones passed recently, but did in the distant past.  He had taken amoxil before the ucx was collected, so that may explain the neg ucx.  D/w pt.   Meds, vitals, and allergies reviewed.   ROS: See HPI.  Otherwise, noncontributory.  GEN: nad, alert and oriented HEENT: mucous membranes moist NECK: supple w/o LA CV: rrr. PULM: ctab, no inc wob ABD: soft, +bs, not ttp EXT: no edema SKIN: no acute rash

## 2014-02-27 NOTE — Patient Instructions (Signed)
Finish the antibiotics and then if you have no troubles, we need to recheck your urine about 1 week later (at a lab appointment).  If you have any other fevers or urine changes, then let me know.  We may need to extend the antibiotics.

## 2014-03-01 NOTE — Assessment & Plan Note (Signed)
Presumed, with amoxil taken before the ucx could be collected.  Improved, no fevers.  Doing well overall.  Will finish current abx rx.  If no more sx, return for f/u u/a.  If sx return, he'll notify me.  He agrees.

## 2014-03-04 ENCOUNTER — Telehealth: Payer: Self-pay | Admitting: Family Medicine

## 2014-03-04 DIAGNOSIS — R300 Dysuria: Secondary | ICD-10-CM

## 2014-03-04 NOTE — Addendum Note (Signed)
Addended by: Tonia Ghent on: 03/04/2014 09:55 PM   Modules accepted: Orders

## 2014-03-04 NOTE — Telephone Encounter (Signed)
Ordered. Thanks

## 2014-03-04 NOTE — Telephone Encounter (Signed)
Patient called to schedule follow up urinalysis.  Patient said he finished the antibiotic on Sunday and he's having burning with urination.  Patient scheduled lab appointment for tomorrow at 8:20.

## 2014-03-05 ENCOUNTER — Other Ambulatory Visit (INDEPENDENT_AMBULATORY_CARE_PROVIDER_SITE_OTHER): Payer: Medicare HMO

## 2014-03-05 DIAGNOSIS — R300 Dysuria: Secondary | ICD-10-CM

## 2014-03-06 ENCOUNTER — Encounter: Payer: Self-pay | Admitting: Family Medicine

## 2014-03-06 ENCOUNTER — Ambulatory Visit (INDEPENDENT_AMBULATORY_CARE_PROVIDER_SITE_OTHER): Payer: Commercial Managed Care - HMO | Admitting: Family Medicine

## 2014-03-06 ENCOUNTER — Ambulatory Visit (INDEPENDENT_AMBULATORY_CARE_PROVIDER_SITE_OTHER)
Admission: RE | Admit: 2014-03-06 | Discharge: 2014-03-06 | Disposition: A | Discharge status: Home / Self Care | Payer: Commercial Managed Care - HMO | Source: Ambulatory Visit | Attending: Family Medicine | Admitting: Family Medicine

## 2014-03-06 VITALS — BP 122/70 | HR 63 | Temp 97.8°F | Wt 220.0 lb

## 2014-03-06 DIAGNOSIS — R300 Dysuria: Secondary | ICD-10-CM

## 2014-03-06 DIAGNOSIS — N390 Urinary tract infection, site not specified: Secondary | ICD-10-CM

## 2014-03-06 LAB — URINALYSIS, COMPLETE
CASTS: NONE SEEN
GLUCOSE, UA: NEGATIVE mg/dL
HGB URINE DIPSTICK: NEGATIVE
LEUKOCYTES UA: NEGATIVE
Nitrite: NEGATIVE
PH: 5.5 (ref 5.0–8.0)
Protein, ur: NEGATIVE mg/dL
Specific Gravity, Urine: 1.021 (ref 1.005–1.030)
Urobilinogen, UA: 0.2 mg/dL (ref 0.0–1.0)

## 2014-03-06 MED ORDER — CIPROFLOXACIN HCL 500 MG PO TABS
500.0000 mg | ORAL_TABLET | Freq: Two times a day (BID) | ORAL | Status: DC
Start: 2014-03-06 — End: 2014-12-01

## 2014-03-06 NOTE — Patient Instructions (Signed)
Go to the lab on the way out.  We'll contact you with your xray and urine culture report. Restart the cipro and that should help.  Take care.  Glad to see you.

## 2014-03-06 NOTE — Progress Notes (Signed)
Pre visit review using our clinic review tool, if applicable. No additional management support is needed unless otherwise documented below in the visit note.  He was better after finishing the abx, but started "backsliding" in the meantime.  No fevers, but pain/burning with urination, along the penile shaft.  Urinary urgency but less UOP per void.  Going ~20x/day, small voids with each.   U/a with no blood but crystals noted.   More pain in the saddle, when riding his motorcycle.  That was a recent change.   Meds, vitals, and allergies reviewed.   ROS: See HPI.  Otherwise, noncontributory.  nad ncat Mmm rrr ctab Abd soft, not ttp No cva pain Prostate not ttp or boggy on DRE

## 2014-03-07 LAB — URINE CULTURE

## 2014-03-08 NOTE — Assessment & Plan Note (Addendum)
I still think he has a UTI that was culture neg from the antecedent abx.  He was likely partially treated, concern for early prostatitis, though DRE is unremarkable today.  See notes on imaging, KUB. Crystals noted on u/a, but no recent stones passed. His sx seem more typical for a UTI/prostatitis then renal stone.  Restart cipro for a longer course this time.  He agrees.

## 2014-03-10 ENCOUNTER — Other Ambulatory Visit: Payer: Self-pay | Admitting: Family Medicine

## 2014-03-10 MED ORDER — HYDROCODONE-ACETAMINOPHEN 5-325 MG PO TABS: 1.0000 | ORAL_TABLET | Freq: Four times a day (QID) | ORAL | Status: DC | PRN

## 2014-03-16 ENCOUNTER — Telehealth: Payer: Self-pay

## 2014-03-16 DIAGNOSIS — N390 Urinary tract infection, site not specified: Secondary | ICD-10-CM

## 2014-03-16 NOTE — Telephone Encounter (Signed)
Pt left v/m; pt is taking cipro and does not see improvement in how pt feeling; pt has not passed kidney stone. Pt request referral to urologist.Please advise.

## 2014-03-16 NOTE — Telephone Encounter (Signed)
Patient notified as instructed by telephone. Was advised by patient that Rosaria Ferries has already given him a call and he is waiting to hear back from the urologist.

## 2014-03-16 NOTE — Telephone Encounter (Signed)
Ordered. Thanks

## 2014-05-21 IMAGING — CR DG LUMBAR SPINE 1V
1 series · 1 of 1 positions shown · non-contrast
Comparison: None.

CLINICAL DATA: Lumbar laminectomy and decompression
microdiskectomy.

LUMBAR SPINE - 1 VIEW

[view not recorded]
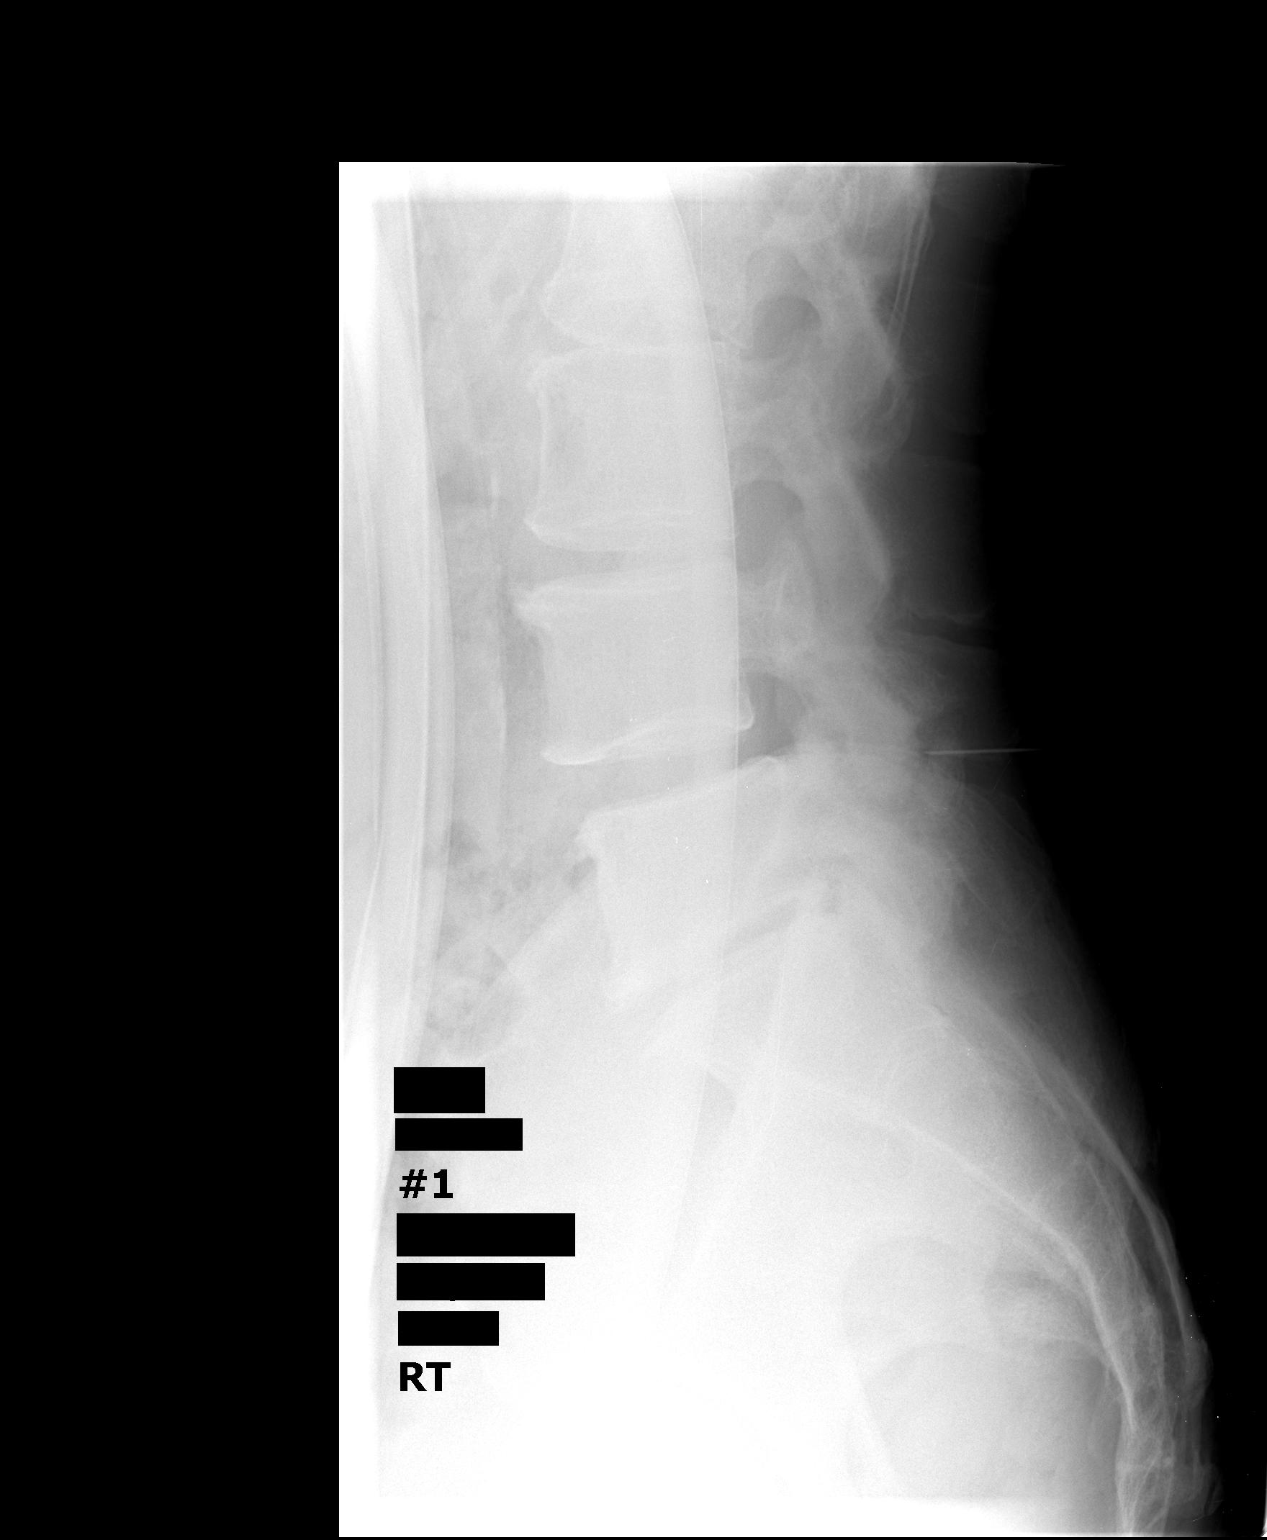

[1 of 1 positions shown; findings below may reference images not displayed]

FINDINGS: Two intraoperative cross-table lateral views of the
lumbar spine are submitted.  The first image, taken at 4332 hours,
shows a surgical instrument tip projecting posterior to the L4-5
facet joints.

The second image, taken at 6529 hours, shows a surgical instrument
tip projecting posterior to the L4 vertebral body.
IMPRESSION: Intraoperative visualization, as above.

## 2014-09-11 NOTE — Op Note (Signed)
PATIENT NAME:  David Mcgee, David Mcgee MR#:  [id number] DATE OF BIRTH:  1945/12/31  DATE OF PROCEDURE:  05/27/2012  PREOPERATIVE DIAGNOSIS: Degenerative arthrosis of the left knee.   POSTOPERATIVE DIAGNOSIS: Degenerative arthrosis of the left knee.   PROCEDURE PERFORMED: Left total knee arthroplasty using computer-assisted navigation.   SURGEON: Marciano Sequin., MD    ASSISTANT: Vance Peper, PA-C (required to maintain retraction throughout the procedure)   ANESTHESIA: Femoral nerve block and spinal.   ESTIMATED BLOOD LOSS: 75 mL.   FLUIDS REPLACED: 2000 mL of crystalloid.   TOURNIQUET TIME: 96 minutes.   DRAINS: Two medium drains to reinfusion system.   SOFT TISSUE RELEASES: Anterior cruciate ligament, posterior cruciate ligament, deep and superficial medial collateral ligament, and patellofemoral ligament.   IMPLANTS UTILIZED: DePuy PFC Sigma size 5 posterior stabilized femoral component (cemented), size 5 MBT tibial component (cemented), 41 mm three-peg oval dome patella (cemented), and a 10 mm stabilized rotating platform polyethylene insert.   INDICATIONS FOR SURGERY: The patient is a 69 year old male who has been seen for complaints of progressive left knee pain. X-rays demonstrated severe degenerative changes in tricompartmental fashion with relative varus deformity. He had noted progressive decrease in his knee range of motion. Pain had increased to the point that it was significantly interfering with his activities of daily living. The risks and benefits of surgical intervention were discussed with the patient. He expressed his understanding of the risks and benefits and agreed with plans for surgical intervention.  PROCEDURE IN DETAIL: The patient was brought into the operating room and, after adequate femoral nerve block and spinal anesthesia was achieved, a tourniquet was placed on the patient's upper left thigh. The patient's left knee and leg were cleaned and prepped with alcohol and  DuraPrep and draped in the usual sterile fashion. A "timeout" was performed as per usual protocol. The left lower extremity was exsanguinated using an Esmarch, and the tourniquet was inflated to 300 mmHg. An anterior longitudinal incision was made, followed by a standard mid vastus approach. A moderate effusion was evacuated. The deep fibers of the medial collateral ligament were elevated in a subperiosteal fashion off the medial flare of the tibia so as to maintain a continuous soft tissue sleeve. The patella was subluxed laterally, and the patellofemoral ligament was incised. Inspection of the knee demonstrated severe degenerative changes in a tricompartmental fashion with evidence of eburnated bone noted to the medial compartment. Prominent osteophytes were debrided using a rongeur. Anterior and posterior cruciate ligaments were excised. Two 4.0 mm Schanz pins were inserted into the femur and into the tibia for attachment of the array of trackers used for computer-assisted navigation. Hip center was identified using circumduction technique. Distal landmarks were mapped using the computer. The distal femur and proximal tibia were mapped using the computer. The distal femoral cutting guide was positioned using computer-assisted navigation so as to achieve a 5- degree distal valgus cut. Cut was performed and verified using the computer. The distal femur was sized, and it was felt that a size 5 femoral component was appropriate. A size 5 cutting guide was positioned, and the anterior cut was performed and verified using the computer. This was followed by completion of the posterior and chamfer cuts. A femoral cutting guide for the central box was then positioned, and the central box cut was performed.     Attention was then directed to the proximal tibia. Medial and lateral menisci were excised. The extramedullary tibial cutting guide was positioned using computer-assisted  navigation so as to achieve 0-degree  varus-valgus alignment and 0-degree posterior slope. Cut was performed and verified using the computer. The proximal tibia was sized, and it was felt that a size 5 tibial tray was appropriate. Tibial and femoral trials were inserted, followed by insertion of a 10 mm polyethylene insert. The knee was felt to be tight medially. A Cobb elevator was used to elevate the superficial fibers of the medial collateral ligament. This allowed for excellent medial and lateral soft tissue balancing both in full extension and in flexion. Finally, the patella was cut and prepared so as to accommodate a 41 mm three-peg oval dome patella. Patellar trial was placed, and the knee was placed through a range of motion with excellent patellar tracking appreciated. The femoral trial was removed after debridement of posterior osteophytes. A central post hole for the tibial component was reamed, followed by insertion of a keel punch. The tibial trials were then removed. Cut surfaces of bone were irrigated with copious amounts of normal saline with antibiotic solution using pulsatile lavage and then suctioned dry. Polymethyl methacrylate cement was prepared in the usual fashion using a vacuum mixer. Cement was applied to the cut surface of the proximal tibia as well as along the undersurface of a size 5 MBT tibial component. The tibial component was positioned and impacted into place. Excess cement was removed using freer elevators. Cement was then applied to the cut surfaces of the femur as well as along the posterior flanges of a size 5 posterior stabilized femoral component. The femoral component was positioned and impacted into place. Excess cement was removed using freer elevators. A 10 mm polyethylene trial was inserted, and the knee was brought into full extension with steady axial compression applied. Finally, cement was applied to the backside of a 41 mm three-peg oval dome patella, and the patellar component was positioned and  patellar clamp applied. Excess cement was removed using freer elevators.   After adequate curing of cement, the tourniquet was deflated after a total tourniquet time of 96 minutes. Hemostasis was achieved using electrocautery. The knee was irrigated with copious amounts of normal saline with antibiotic solution using pulsatile lavage and then suctioned dry. The knee was inspected for any residual cement debris; 30 mL of 0.25% Marcaine with epinephrine was injected along the posterior capsule. A 10 mm stabilized rotating platform polyethylene insert was inserted, and the knee was placed through a range of motion with excellent medial and lateral soft tissue balancing appreciated in flexion and in full extension. Excellent patellar tracking was appreciated. Two medium drains were placed in the wound bed and brought out through a separate stab incision to be attached to a reinfusion system. The medial parapatellar portion of the incision was reapproximated using interrupted sutures of #1 Vicryl. The subcutaneous tissue was approximated in layers using first #0 Vicryl, followed by #2-0 Vicryl. The skin was closed with skin staples. A sterile dressing was applied.   The patient tolerated the procedure well. He was transported to the recovery room in stable condition.  ____________________________ Laurice Record. Holley Bouche., MD jph:cb D: 05/28/2012 00:26:07 ET T: 05/28/2012 11:36:29 ET JOB#: [id number]  cc: Laurice Record. Holley Bouche., MD, <Dictator> Laurice Record Holley Bouche MD ELECTRONICALLY SIGNED 05/29/2012 19:28

## 2014-09-11 NOTE — Discharge Summary (Signed)
PATIENT NAME:  David Mcgee, David Mcgee MR#:  [id number] DATE OF BIRTH:  1945-10-19  DATE OF ADMISSION:  05/27/2012 DATE OF DISCHARGE:  05/29/2012  ADMITTING DIAGNOSIS: Degenerative arthrosis of the left knee.   DISCHARGE DIAGNOSIS: Degenerative arthrosis of the left knee.   HISTORY: The patient is 69 year old male who has been followed at [facility] for progression of right knee pain. The patient reported a several-year history of progressive left knee pain. His pain had become relatively constant in nature. He was experiencing some night pain. The pain was noted to be aggravated with weight-bearing activities. He did report some swelling of the knee as well as occasional near giving way. At the time of surgery, he was not using any ambulatory aid. He was instructed to discontinue his anti-inflammatories secondary to decreased kidney function. He states that the tramadol had not been providing any significant improvement in his condition. The pain had increased to the point that it was significantly interfering with his activities of daily living. X-rays taken in [facility] showed bone-on-bone to the medial compartment space with increased subchondral sclerosis. He was noted to have severe tricompartmental degenerative changes. Osteophyte as well as subchondral sclerosis was noted. After discussion of the risks and benefits of surgical intervention, the patient expressed his understanding of the risks and benefits and agreed for plans for surgical intervention.   PROCEDURE: Left total knee arthroplasty using computer-assisted navigation. Anesthesia: Spinal with femoral nerve block. Soft tissue release: Anterior cruciate ligament, posterior cruciate ligament, deep and superficial medial collateral ligaments as well as the patellofemoral ligament.   IMPLANTS UTILIZED: DePuy PFC Sigma size 5 posterior stabilized femoral component (cemented), size 5 MBT tibial component (cemented), 41 mm  three-pegged oval dome patella (cemented), and a 10 mm stabilized rotating platform polyethylene insert.   HOSPITAL COURSE: The patient tolerated the procedure very well. He had no complications. He was then taken to the PACU where he was stabilized and then transferred to the orthopedic floor. He began receiving anticoagulation therapy of Lovenox 40 mg subcutaneous q. 12 hours per Anesthesia and Pharmacy protocol. He was fitted with TED stockings bilaterally. These were allowed to be removed 1 hour per 8-hour shift. The left one was applied on day 2 following removal of the Hemovac and dressing change. He was also fitted with AVI compression foot pumps bilaterally set at 80 mmHg. Heels were elevated off the bed using rolled towels. There has been no evidence of any DVTs. Negative Homans sign.   The patient denies any chest pain or shortness of breath. Vital signs have been stable. He has been afebrile. Hemodynamically he was stable. No transfusions were given other than the Autovac transfusion given the first 6 hours postoperatively.   Physical therapy was initiated on day 1 for gait training and transfers. On day 1 the patient was ambulating greater than 240 feet and was able go up and down 4 sets of steps. He was independent with bed-to-chair transfers. Occupational therapy was also initiated on day 1 for ADLs  and assistive devices.   DISPOSITION: The patient is being discharged to home in improved stable condition.   DISCHARGE INSTRUCTIONS: He may weight bear as tolerated. He will continue to use a walker until cleared by Physical Therapy to go to a quad cane. He will receive home health physical therapy. He has a follow-up appointment on January 21st at 08:15. He is to continue with TED stockings. These are to be worn during the day but may be removed  at night. Continue Polar Care pretty much around-the-clock for 2 weeks until seen in the office. He is placed on a regular diet. He is to resume his  regular medications that he was on prior to admission. He was given a prescription for oxycodone 5 to 10 mg every 4 to 6 hours p.r.n., tramadol 50 to 100 mg q. 4 to 6 hours p.r.n., Lovenox 40 mg daily for 14 days, then discontinue and begin taking one 81 mg enteric-coated aspirin.   PAST MEDICAL HISTORY: Hernia, hypertension, arthritis, kidney stones.   ____________________________ Vance Peper, PA jrw:cb D: 05/29/2012 15:52:37 ET T: 05/29/2012 19:21:45 ET JOB#: [id number]  cc: Vance Peper, PA, <Dictator> JON WOLFE PA ELECTRONICALLY SIGNED 05/31/2012 8:06

## 2014-11-29 ENCOUNTER — Other Ambulatory Visit: Payer: Self-pay | Admitting: Family Medicine

## 2014-11-29 DIAGNOSIS — E785 Hyperlipidemia, unspecified: Secondary | ICD-10-CM

## 2014-12-01 ENCOUNTER — Ambulatory Visit (INDEPENDENT_AMBULATORY_CARE_PROVIDER_SITE_OTHER): Payer: PPO | Admitting: Cardiovascular Disease

## 2014-12-01 ENCOUNTER — Encounter: Payer: Self-pay | Admitting: Cardiovascular Disease

## 2014-12-01 VITALS — BP 120/72 | HR 53 | Ht 74.5 in | Wt 226.0 lb

## 2014-12-01 DIAGNOSIS — E785 Hyperlipidemia, unspecified: Secondary | ICD-10-CM | POA: Diagnosis not present

## 2014-12-01 DIAGNOSIS — R002 Palpitations: Secondary | ICD-10-CM | POA: Diagnosis not present

## 2014-12-01 DIAGNOSIS — I10 Essential (primary) hypertension: Secondary | ICD-10-CM | POA: Diagnosis not present

## 2014-12-01 DIAGNOSIS — M25561 Pain in right knee: Secondary | ICD-10-CM

## 2014-12-01 DIAGNOSIS — K219 Gastro-esophageal reflux disease without esophagitis: Secondary | ICD-10-CM

## 2014-12-01 NOTE — Patient Instructions (Signed)
You are doing well. No medication changes were made.  Please call us if you have new issues that need to be addressed before your next appt.  Your physician wants you to follow-up in: 12 months.  You will receive a reminder letter in the mail two months in advance. If you don't receive a letter, please call our office to schedule the follow-up appointment. 

## 2014-12-01 NOTE — Assessment & Plan Note (Signed)
No significant palpitations or arrhythmia noted. No further workup at this time

## 2014-12-01 NOTE — Assessment & Plan Note (Signed)
Worsening right knee pain. He is concerned about having total knee replacement and risk of complications.

## 2014-12-01 NOTE — Assessment & Plan Note (Signed)
Currently on omeprazole. Recent episode of nausea, likely causing orthostatic hypotension/vasovagal episode Improved by laying down. No further episodes since that time

## 2014-12-01 NOTE — Assessment & Plan Note (Signed)
Blood pressure is well controlled on today's visit. No changes made to the medications. 

## 2014-12-01 NOTE — Assessment & Plan Note (Signed)
Cholesterol is at goal on the current lipid regimen. No changes to the medications were made.  

## 2014-12-01 NOTE — Progress Notes (Signed)
Patient ID: David Mcgee, male    DOB: 1945/11/28, 69 y.o.   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 Felt better since then Occasionally has episodes of stomach upset leading to lightheadedness for which he needs to lie down  Last month with profound fatigue, better recently No  chest pain or SOB.  No regular exercise program Scheduled to have lab work tomorrow through primary care including lipids, LFTs  EKG today shows sinus bradycardia with rate 53 beats per minute with nonspecific T wave abnormality in the anterior precordial leads, no change compared to prior EKG  Other past medical history History of left total knee replacement surgery earlier in 2014 and recovered well.   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 Previously had Some radiating pain down his right leg.  He is not taking any beta blocker secondary to bradycardia    Allergies  Allergen Reactions  . Azithromycin Nausea Only    Current Outpatient Prescriptions on File Prior to Visit  Medication Sig Dispense Refill  . amLODipine (NORVASC) 10 MG tablet Take 1 tablet (10 mg total) by mouth at bedtime. 90 tablet 3  . aspirin 81 MG tablet Take 81 mg by mouth at bedtime.     Marland Kitchen atorvastatin (LIPITOR) 10 MG tablet Take 1 tablet (10 mg total) by mouth at bedtime. 90 tablet 3  . HYDROcodone-acetaminophen (NORCO/VICODIN) 5-325 MG per tablet Take 1 tablet by mouth every 6 (six) hours as needed for moderate pain or severe pain (sedation caution). 30  tablet 0  . hydroxypropyl methylcellulose (ISOPTO TEARS) 2.5 % ophthalmic solution Place 1 drop into both eyes daily as needed. For dry eyes    . Multiple Vitamin (MULTIVITAMIN) tablet Take 1 tablet by mouth daily.    . Omega-3 Fatty Acids (FISH OIL) 1200 MG CAPS Take 1 capsule by mouth 2 (two) times daily.     Marland Kitchen omeprazole (PRILOSEC) 20 MG capsule Take 1 capsule (20 mg total) by mouth 2 (two) times daily. 180 capsule 3  . sertraline (ZOLOFT) 50 MG tablet Take 50 mg by mouth at bedtime.     . traMADol (ULTRAM) 50 MG tablet Take 50 mg by mouth every 6 (six) hours as needed for pain.    . traZODone (DESYREL) 50 MG tablet Take 50 mg by mouth daily as needed. For sleep     No current facility-administered medications on file prior to visit.    Past Medical History  Diagnosis Date  . Palpitations   . GERD (gastroesophageal reflux disease)   . Insomnia   . PTSD (post-traumatic stress disorder)   . Cancer     Skin, not melanoma  . Spinal cord cysts   . Hypertension   . History of stress test     long time ago- wnl  . Depression     ? panic attacks, takes sertraline & also sees psych q 3 months   . Chronic kidney disease     loss of kidney function - L - since 1990's,  h/o of renal calculi  . H/O hiatal hernia   . Arthritis     back, knees, hands - OA  . PTSD (post-traumatic stress disorder)     post [place]    Past Surgical History  Procedure Laterality Date  . Neuroma surgery      Right foot Morton's neuroma excision  . Knee arthroscopy  10/2000    Right Knee (Dr. Sabra Heck)  . Hernoiorraphy      Right; years ago  . Esophagogastroduodenoscopy  06/1995    Dilitation bx negative (Dr. Epifanio Lesches)  . Total knee arthroplasty    . Joint replacement  05/2012    L  . Tonsillectomy    . Lumbar laminectomy/decompression microdiscectomy Right 12/09/2012    Procedure: LUMBAR LAMINECTOMY/DECOMPRESSION MICRODISCECTOMY 1 LEVEL;  Surgeon: Elaina Hoops, MD;  Location: [facility];  Service:  Neurosurgery;  Laterality: Right;  LUMBAR LAMINECTOMY/DECOMPRESSION MICRODISCECTOMY 1 LEVEL    Social History  reports that he quit smoking about 49 years ago. His smoking use included Cigarettes. He has a 13.5 pack-year smoking history. He has never used smokeless tobacco. He reports that he does not drink alcohol or use illicit drugs.  Family History family history includes Depression in his brother; Diabetes in his brother, mother, and sister; Hyperlipidemia in his brother; Hypertension in his brother, father, and mother; Kidney failure in his father; Obesity in his sister; Stroke in his brother. There is no history of Colon cancer or Prostate cancer.  Review of Systems  Constitutional: Negative.   Respiratory: Negative.   Cardiovascular: Negative.   Gastrointestinal: Negative.   Musculoskeletal: Negative.   Skin: Negative.   Neurological: Negative.   Hematological: Negative.   Psychiatric/Behavioral: Negative.   All other systems reviewed and are negative.   BP 120/72 mmHg  Pulse 53  Ht 6' 2.5" (1.892 m)  Wt 226 lb (102.513 kg)  BMI 28.64 kg/m2  Physical Exam  Constitutional: He is oriented to person, place, and time. He appears well-developed and well-nourished.  HENT:  Head: Normocephalic.  Nose: Nose normal.  Mouth/Throat: Oropharynx is clear and moist.  Eyes: Conjunctivae are normal. Pupils are equal, round, and reactive to light.  Neck: Normal range of motion. Neck supple. No JVD present.  Cardiovascular: Normal rate, regular rhythm, S1 normal, S2 normal, normal heart sounds and intact distal pulses.  Exam reveals no gallop and no friction rub.   No murmur heard. Pulmonary/Chest: Effort normal and breath sounds normal. No respiratory distress. He has no wheezes. He has no rales. He exhibits no tenderness.  Abdominal: Soft. Bowel sounds are normal. He exhibits no distension. There is no tenderness.  Musculoskeletal: Normal range of motion. He exhibits no edema or  tenderness.  Lymphadenopathy:    He has no cervical adenopathy.  Neurological: He is alert and oriented to person, place, and time. Coordination normal.  Skin: Skin is warm and dry. No rash noted. No erythema.  Psychiatric: He has a normal mood and affect. His behavior is normal. Judgment and thought content normal.      Assessment and Plan   Nursing note and vitals reviewed.

## 2014-12-02 ENCOUNTER — Other Ambulatory Visit (INDEPENDENT_AMBULATORY_CARE_PROVIDER_SITE_OTHER): Payer: PPO

## 2014-12-02 DIAGNOSIS — E785 Hyperlipidemia, unspecified: Secondary | ICD-10-CM | POA: Diagnosis not present

## 2014-12-02 LAB — COMPREHENSIVE METABOLIC PANEL
ALBUMIN: 4.4 g/dL (ref 3.5–5.2)
ALT: 24 U/L (ref 0–53)
AST: 24 U/L (ref 0–37)
Alkaline Phosphatase: 52 U/L (ref 39–117)
BUN: 17 mg/dL (ref 6–23)
CO2: 30 mEq/L (ref 19–32)
Calcium: 9.4 mg/dL (ref 8.4–10.5)
Chloride: 104 mEq/L (ref 96–112)
Creatinine, Ser: 1.47 mg/dL (ref 0.40–1.50)
GFR: 50.3 mL/min — AB (ref >60.00)
GLUCOSE: 113 mg/dL — AB (ref 70–99)
POTASSIUM: 4 meq/L (ref 3.5–5.1)
Sodium: 140 mEq/L (ref 135–145)
TOTAL PROTEIN: 7.4 g/dL (ref 6.0–8.3)
Total Bilirubin: 1.1 mg/dL (ref 0.2–1.2)

## 2014-12-02 LAB — LIPID PANEL
CHOLESTEROL: 124 mg/dL (ref 0–200)
HDL: 37.3 mg/dL — ABNORMAL LOW (ref >39.00)
LDL CALC: 68 mg/dL (ref 0–99)
NONHDL: 86.7
Total CHOL/HDL Ratio: 3
Triglycerides: 95 mg/dL (ref 0.0–149.0)
VLDL: 19 mg/dL (ref 0.0–40.0)

## 2014-12-07 ENCOUNTER — Ambulatory Visit (INDEPENDENT_AMBULATORY_CARE_PROVIDER_SITE_OTHER): Payer: PPO | Admitting: Family Medicine

## 2014-12-07 ENCOUNTER — Encounter: Payer: Self-pay | Admitting: Family Medicine

## 2014-12-07 VITALS — BP 116/70 | HR 57 | Temp 98.7°F | Ht 75.0 in | Wt 226.5 lb

## 2014-12-07 DIAGNOSIS — I10 Essential (primary) hypertension: Secondary | ICD-10-CM | POA: Diagnosis not present

## 2014-12-07 DIAGNOSIS — Z0000 Encounter for general adult medical examination without abnormal findings: Secondary | ICD-10-CM | POA: Diagnosis not present

## 2014-12-07 DIAGNOSIS — K219 Gastro-esophageal reflux disease without esophagitis: Secondary | ICD-10-CM | POA: Diagnosis not present

## 2014-12-07 DIAGNOSIS — R7989 Other specified abnormal findings of blood chemistry: Secondary | ICD-10-CM

## 2014-12-07 DIAGNOSIS — F4310 Post-traumatic stress disorder, unspecified: Secondary | ICD-10-CM

## 2014-12-07 DIAGNOSIS — E785 Hyperlipidemia, unspecified: Secondary | ICD-10-CM | POA: Diagnosis not present

## 2014-12-07 DIAGNOSIS — M25569 Pain in unspecified knee: Secondary | ICD-10-CM

## 2014-12-07 DIAGNOSIS — R739 Hyperglycemia, unspecified: Secondary | ICD-10-CM

## 2014-12-07 DIAGNOSIS — Z7189 Other specified counseling: Secondary | ICD-10-CM

## 2014-12-07 DIAGNOSIS — R748 Abnormal levels of other serum enzymes: Secondary | ICD-10-CM

## 2014-12-07 NOTE — Progress Notes (Signed)
Pre visit review using our clinic review tool, if applicable. No additional management support is needed unless otherwise documented below in the visit note.  I have personally reviewed the Medicare Annual Wellness questionnaire and have noted 1. The patient's medical and social history 2. Their use of alcohol, tobacco or illicit drugs 3. Their current medications and supplements 4. The patient's functional ability including ADL's, fall risks, home safety risks and hearing or visual             impairment. 5. Diet and physical activities 6. Evidence for depression or mood disorders  The patients weight, height, BMI have been recorded in the chart and visual acuity is per eye clinic.  I have made referrals, counseling and provided education to the patient based review of the above and I have provided the pt with a written personalized care plan for preventive services.  Provider list updated- see scanned forms.  Routine anticipatory guidance given to patient.  See health maintenance.  Flu 2015 Shingles 2015 at [facility] clinic PNA d/w pt . Tetanus 2010 Colonoscopy 2016 per [place] clinic.   Prostate cancer screening and PSA options (with potential risks and benefits of testing vs not testing) were discussed along with recent recs/guidelines.  He declined testing PSA at this point. Advance directive- wife designated if patient were incapacitated.   Cognitive function addressed- see scanned forms- and if abnormal then additional documentation follows.   Knee pain- [facility] vs [facility].  He is considering his options. I'll defer to patient and his ortho MDs.   Hypertension:    Using medication without problems or lightheadedness: rarely lightheaded but not usually.  Chest pain with exertion:no Edema:no Short of breath:no  Elevated Cholesterol: Using medications without problems: yes Muscle aches: no Diet compliance: "eating to much and loving it."  D/w pt OV:FIEP.  Encouraged.    Exercise: limited by knee pain.    He has seen psych with the [place].  He is still helping with [facility], etc.   GERD.  Failed taper of PPI. D/w pt about options.  He clearly felt better on med and it is reasonable to continue.  D/w pt about diet and weight.     Elevated Cr and sugar.  D/w pt about both.  I'm unsure if the Cr elevation is real.  D/w pt.  Would recheck.  No BLE edema and BP still okay.   PMH and SH reviewed  Meds, vitals, and allergies reviewed.   ROS: See HPI.  Otherwise negative.    GEN: nad, alert and oriented HEENT: mucous membranes moist NECK: supple w/o LA CV: rrr. PULM: ctab, no inc wob ABD: soft, +bs EXT: no edema SKIN: no acute rash

## 2014-12-07 NOTE — Patient Instructions (Addendum)
Check on the other pneumonia shot (prevnar 13).   Please get a colonoscopy report and get it to me.   Recheck labs in about 1 week- fasting but make sure you drink enough water to keep your urine clear.  Take care. Glad to see you.

## 2014-12-09 DIAGNOSIS — Z7189 Other specified counseling: Secondary | ICD-10-CM | POA: Insufficient documentation

## 2014-12-09 DIAGNOSIS — R7989 Other specified abnormal findings of blood chemistry: Secondary | ICD-10-CM | POA: Insufficient documentation

## 2014-12-09 DIAGNOSIS — R739 Hyperglycemia, unspecified: Secondary | ICD-10-CM | POA: Insufficient documentation

## 2014-12-09 NOTE — Assessment & Plan Note (Signed)
Clearly failed taper off med, would continue for now.

## 2014-12-09 NOTE — Assessment & Plan Note (Signed)
Controlled, continue as is.  Labs d/w pt.  

## 2014-12-09 NOTE — Assessment & Plan Note (Signed)
Controlled, d/w pt about diet.  Continue meds as is.  If more lightheaded, we can taper meds.  D/w pt about sugar and diet.

## 2014-12-09 NOTE — Assessment & Plan Note (Signed)
Flu 2015 Shingles 2015 at [facility] clinic PNA d/w pt . Tetanus 2010 Colonoscopy 2016 per [place] clinic.   Prostate cancer screening and PSA options (with potential risks and benefits of testing vs not testing) were discussed along with recent recs/guidelines.  He declined testing PSA at this point. Advance directive- wife designated if patient were incapacitated.   Cognitive function addressed- see scanned forms- and if abnormal then additional documentation follows.

## 2014-12-09 NOTE — Assessment & Plan Note (Signed)
I'm not sure this is a true elevation.  He'll make sure to be well hydrated and we'll recheck.  He agrees.

## 2014-12-09 NOTE — Assessment & Plan Note (Signed)
Per ortho. I'll defer.

## 2014-12-09 NOTE — Assessment & Plan Note (Signed)
Can recheck with the f/u bmet.  D/w pt.  He agrees.

## 2014-12-09 NOTE — Assessment & Plan Note (Signed)
[facility] 

## 2014-12-14 ENCOUNTER — Other Ambulatory Visit (INDEPENDENT_AMBULATORY_CARE_PROVIDER_SITE_OTHER): Payer: PPO

## 2014-12-14 DIAGNOSIS — R739 Hyperglycemia, unspecified: Secondary | ICD-10-CM

## 2014-12-14 LAB — BASIC METABOLIC PANEL
BUN: 16 mg/dL (ref 6–23)
CHLORIDE: 106 meq/L (ref 96–112)
CO2: 27 mEq/L (ref 19–32)
Calcium: 9.2 mg/dL (ref 8.4–10.5)
Creatinine, Ser: 1.38 mg/dL (ref 0.40–1.50)
GFR: 54.1 mL/min — AB (ref >60.00)
Glucose, Bld: 107 mg/dL — ABNORMAL HIGH (ref 70–99)
POTASSIUM: 3.9 meq/L (ref 3.5–5.1)
Sodium: 141 mEq/L (ref 135–145)

## 2015-06-07 DIAGNOSIS — Z872 Personal history of diseases of the skin and subcutaneous tissue: Secondary | ICD-10-CM | POA: Diagnosis not present

## 2015-06-07 DIAGNOSIS — Z85828 Personal history of other malignant neoplasm of skin: Secondary | ICD-10-CM | POA: Diagnosis not present

## 2015-06-07 DIAGNOSIS — Z08 Encounter for follow-up examination after completed treatment for malignant neoplasm: Secondary | ICD-10-CM | POA: Diagnosis not present

## 2015-06-07 DIAGNOSIS — D485 Neoplasm of uncertain behavior of skin: Secondary | ICD-10-CM | POA: Diagnosis not present

## 2015-06-07 DIAGNOSIS — C44509 Unspecified malignant neoplasm of skin of other part of trunk: Secondary | ICD-10-CM | POA: Diagnosis not present

## 2015-06-07 DIAGNOSIS — Z1283 Encounter for screening for malignant neoplasm of skin: Secondary | ICD-10-CM | POA: Diagnosis not present

## 2015-06-07 DIAGNOSIS — L570 Actinic keratosis: Secondary | ICD-10-CM | POA: Diagnosis not present

## 2015-07-20 DIAGNOSIS — M25561 Pain in right knee: Secondary | ICD-10-CM | POA: Diagnosis not present

## 2015-07-20 DIAGNOSIS — M1711 Unilateral primary osteoarthritis, right knee: Secondary | ICD-10-CM | POA: Diagnosis not present

## 2015-07-21 DIAGNOSIS — C44519 Basal cell carcinoma of skin of other part of trunk: Secondary | ICD-10-CM | POA: Diagnosis not present

## 2015-07-21 DIAGNOSIS — C44509 Unspecified malignant neoplasm of skin of other part of trunk: Secondary | ICD-10-CM | POA: Diagnosis not present

## 2015-07-22 DIAGNOSIS — M1711 Unilateral primary osteoarthritis, right knee: Secondary | ICD-10-CM | POA: Diagnosis not present

## 2015-08-05 ENCOUNTER — Encounter
Admission: RE | Admit: 2015-08-05 | Discharge: 2015-08-05 | Disposition: A | Discharge status: Home / Self Care | Payer: PPO | Source: Ambulatory Visit | Attending: Orthopedic Surgery | Admitting: Orthopedic Surgery

## 2015-08-05 DIAGNOSIS — Z01812 Encounter for preprocedural laboratory examination: Secondary | ICD-10-CM | POA: Insufficient documentation

## 2015-08-05 LAB — URINALYSIS COMPLETE WITH MICROSCOPIC (ARMC ONLY)
BILIRUBIN URINE: NEGATIVE
Bacteria, UA: NONE SEEN
GLUCOSE, UA: NEGATIVE mg/dL
HGB URINE DIPSTICK: NEGATIVE
Ketones, ur: NEGATIVE mg/dL
NITRITE: NEGATIVE
Protein, ur: 30 mg/dL — AB
Specific Gravity, Urine: 1.024 (ref 1.005–1.030)
pH: 5 (ref 5.0–8.0)

## 2015-08-05 LAB — BASIC METABOLIC PANEL
Anion gap: 8 (ref 5–15)
BUN: 17 mg/dL (ref 6–20)
CO2: 27 mmol/L (ref 22–32)
CREATININE: 1.32 mg/dL — AB (ref 0.61–1.24)
Calcium: 9.1 mg/dL (ref 8.9–10.3)
Chloride: 102 mmol/L (ref 101–111)
GFR calc Af Amer: >60 mL/min (ref >60)
GFR, EST NON AFRICAN AMERICAN: 53 mL/min — AB (ref >60)
GLUCOSE: 98 mg/dL (ref 65–99)
Potassium: 3.6 mmol/L (ref 3.5–5.1)
SODIUM: 137 mmol/L (ref 135–145)

## 2015-08-05 LAB — CBC
HCT: 48.8 % (ref 40.0–52.0)
Hemoglobin: 16.8 g/dL (ref 13.0–18.0)
MCH: 29.5 pg (ref 26.0–34.0)
MCHC: 34.4 g/dL (ref 32.0–36.0)
MCV: 85.9 fL (ref 80.0–100.0)
PLATELETS: 163 10*3/uL (ref 150–440)
RBC: 5.69 MIL/uL (ref 4.40–5.90)
RDW: 13.9 % (ref 11.5–14.5)
WBC: 7.2 10*3/uL (ref 3.8–10.6)

## 2015-08-05 LAB — SURGICAL PCR SCREEN
MRSA, PCR: NEGATIVE
STAPHYLOCOCCUS AUREUS: NEGATIVE

## 2015-08-05 LAB — TYPE AND SCREEN
ABO/RH(D): O POS
Antibody Screen: NEGATIVE

## 2015-08-05 LAB — PROTIME-INR
INR: 1.06
Prothrombin Time: 14 seconds (ref 11.4–15.0)

## 2015-08-05 LAB — ABO/RH: ABO/RH(D): O POS

## 2015-08-05 LAB — SEDIMENTATION RATE: Sed Rate: 2 mm/hr (ref 0–20)

## 2015-08-05 LAB — APTT: APTT: 30 s (ref 24–36)

## 2015-08-05 MED ORDER — CEFAZOLIN SODIUM-DEXTROSE 2-3 GM-% IV SOLR: 2.0000 g | Freq: Once | INTRAVENOUS | Status: DC

## 2015-08-05 NOTE — Pre-Procedure Instructions (Signed)
From visit on 12/01/14 Dr. Rockey Situ EKG today shows sinus bradycardia with rate 53 beats per minute with nonspecific T wave abnormality in the anterior precordial leads, no change compared to prior EKG  Other past medical history History of left total knee replacement surgery earlier in 2014 and recovered well.  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 Previously had Some radiating pain down his right leg.  He is not taking any beta blocker secondary to bradycardia    You are doing well. No medication changes were made.  Please call us if you have new issues that need to be addressed before your next appt.  Your physician wants you to follow-up in: 12 months.  You will receive a reminder letter in the mail two months in advance. If you don't receive a letter, please call our office to schedule the follow-up appointment.

## 2015-08-05 NOTE — Patient Instructions (Signed)
  Your procedure is scheduled on: Wednesday August 18, 2015. Report to Same Day Surgery. To find out your arrival time please call [phone] between 1PM - 3PM on Tuesday August 17, 2015.  Remember: Instructions that are not followed completely may result in serious medical risk, up to and including death, or upon the discretion of your surgeon and anesthesiologist your surgery may need to be rescheduled.    _x___ 1. Do not eat food or drink liquids after midnight. No gum chewing or hard candies.     ____ 2. No Alcohol for 24 hours before or after surgery.   ____ 3. Bring all medications with you on the day of surgery if instructed.    __x__ 4. Notify your doctor if there is any change in your medical condition     (cold, fever, infections).     Do not wear jewelry, make-up, hairpins, clips or nail polish.  Do not wear lotions, powders, or perfumes. You may wear deodorant.  Do not shave 48 hours prior to surgery. Men may shave face and neck.  Do not bring valuables to the hospital.    [facility] is not responsible for any belongings or valuables.               Contacts, dentures or bridgework may not be worn into surgery.  Leave your suitcase in the car. After surgery it may be brought to your room.  For patients admitted to the hospital, discharge time is determined by your treatment team.   Patients discharged the day of surgery will not be allowed to drive home.    Please read over the following fact sheets that you were given:   [facility] Preparing for Surgery  _x___ Take these medicines the morning of surgery with A SIP OF WATER:    1. omeprazole (PRILOSEC)   ____ Fleet Enema (as directed)   _x___ Use CHG Soap as directed  ____ Use inhalers on the day of surgery  ____ Stop metformin 2 days prior to surgery    ____ Take 1/2 of usual insulin dose the night before surgery and none on the morning of surgery.   _x___ Ask Dr. Arta Bruce if and when to stop  aspirin.  _x___ Stop Anti-inflammatories such as Aleve, ibuprofen, motrin, advil now.  OK to take Tylenol or Tramadol.   __x_ Stop supplements fish oil now until after surgery.  ____ Bring C-Pap to the hospital.

## 2015-08-08 LAB — URINE CULTURE: Culture: 50000

## 2015-08-09 NOTE — Pre-Procedure Instructions (Signed)
URINE CULTURE FAXED TO DR HOOTEN OFFICE 

## 2015-08-10 ENCOUNTER — Telehealth: Payer: Self-pay | Admitting: Cardiovascular Disease

## 2015-08-10 NOTE — Pre-Procedure Instructions (Signed)
BACTRIM DS STARTED BY DR HOOTEN TO TREAT URINE CULTURE 08/10/15

## 2015-08-10 NOTE — Telephone Encounter (Signed)
Acceptable risk for knee surgery Ok to hold aspirin for 5 days before procedure Typically ortho does not mind if he take asa 81 mg daily

## 2015-08-10 NOTE — Telephone Encounter (Signed)
Patient needs to know if it is ok and when to stop holding asa for 08/18/15 knee surgery .  Please call patient.

## 2015-08-11 NOTE — Telephone Encounter (Signed)
Left detailed message on pt's vm w/ Dr. Donivan Scull recommendation. Asked him to call back w/ any questions or concerns.

## 2015-08-18 ENCOUNTER — Inpatient Hospital Stay: Payer: PPO | Admitting: Anesthesiology

## 2015-08-18 ENCOUNTER — Encounter: Payer: Self-pay | Admitting: *Deleted

## 2015-08-18 ENCOUNTER — Inpatient Hospital Stay
Admission: RE | Admit: 2015-08-18 | Discharge: 2015-08-20 | DRG: 470 | Disposition: A | Discharge status: Home Health | Payer: PPO | Source: Ambulatory Visit | Attending: Orthopedic Surgery | Admitting: Orthopedic Surgery

## 2015-08-18 ENCOUNTER — Inpatient Hospital Stay: Payer: PPO

## 2015-08-18 ENCOUNTER — Encounter
Admission: RE | Disposition: A | Discharge status: Home Health | Payer: Self-pay | Source: Ambulatory Visit | Attending: Orthopedic Surgery

## 2015-08-18 DIAGNOSIS — Z79899 Other long term (current) drug therapy: Secondary | ICD-10-CM

## 2015-08-18 DIAGNOSIS — F4310 Post-traumatic stress disorder, unspecified: Secondary | ICD-10-CM | POA: Diagnosis present

## 2015-08-18 DIAGNOSIS — Z471 Aftercare following joint replacement surgery: Secondary | ICD-10-CM | POA: Diagnosis not present

## 2015-08-18 DIAGNOSIS — Z87891 Personal history of nicotine dependence: Secondary | ICD-10-CM | POA: Diagnosis not present

## 2015-08-18 DIAGNOSIS — Z96651 Presence of right artificial knee joint: Secondary | ICD-10-CM | POA: Diagnosis not present

## 2015-08-18 DIAGNOSIS — Z96659 Presence of unspecified artificial knee joint: Secondary | ICD-10-CM

## 2015-08-18 DIAGNOSIS — F329 Major depressive disorder, single episode, unspecified: Secondary | ICD-10-CM | POA: Diagnosis present

## 2015-08-18 DIAGNOSIS — Z881 Allergy status to other antibiotic agents status: Secondary | ICD-10-CM | POA: Diagnosis not present

## 2015-08-18 DIAGNOSIS — N189 Chronic kidney disease, unspecified: Secondary | ICD-10-CM | POA: Diagnosis present

## 2015-08-18 DIAGNOSIS — G4700 Insomnia, unspecified: Secondary | ICD-10-CM | POA: Diagnosis not present

## 2015-08-18 DIAGNOSIS — K219 Gastro-esophageal reflux disease without esophagitis: Secondary | ICD-10-CM | POA: Diagnosis present

## 2015-08-18 DIAGNOSIS — M1711 Unilateral primary osteoarthritis, right knee: Secondary | ICD-10-CM | POA: Diagnosis not present

## 2015-08-18 DIAGNOSIS — I129 Hypertensive chronic kidney disease with stage 1 through stage 4 chronic kidney disease, or unspecified chronic kidney disease: Secondary | ICD-10-CM | POA: Diagnosis present

## 2015-08-18 DIAGNOSIS — Z7982 Long term (current) use of aspirin: Secondary | ICD-10-CM

## 2015-08-18 DIAGNOSIS — Z96652 Presence of left artificial knee joint: Secondary | ICD-10-CM | POA: Diagnosis present

## 2015-08-18 DIAGNOSIS — M179 Osteoarthritis of knee, unspecified: Secondary | ICD-10-CM | POA: Diagnosis not present

## 2015-08-18 HISTORY — PX: KNEE ARTHROPLASTY: SHX992

## 2015-08-18 SURGERY — ARTHROPLASTY, KNEE, TOTAL, USING IMAGELESS COMPUTER-ASSISTED NAVIGATION
Anesthesia: Spinal | Site: Knee | Laterality: Right | Wound class: Clean

## 2015-08-18 MED ORDER — SODIUM CHLORIDE 0.9 % IV SOLN
INTRAVENOUS | Status: DC
  Administered 2015-08-18 – 2015-08-19 (×3): via INTRAVENOUS

## 2015-08-18 MED ORDER — CEFAZOLIN SODIUM-DEXTROSE 2-4 GM/100ML-% IV SOLN
2.0000 g | Freq: Once | INTRAVENOUS | Status: AC
  Administered 2015-08-18: 2 g via INTRAVENOUS

## 2015-08-18 MED ORDER — ONDANSETRON HCL 4 MG/2ML IJ SOLN
4.0000 mg | Freq: Four times a day (QID) | INTRAMUSCULAR | Status: DC | PRN
  Administered 2015-08-18: 4 mg via INTRAVENOUS
  Filled 2015-08-18: qty 2

## 2015-08-18 MED ORDER — PHENOL 1.4 % MT LIQD
1.0000 | OROMUCOSAL | Status: DC | PRN
  Filled 2015-08-18: qty 177

## 2015-08-18 MED ORDER — LIDOCAINE HCL (CARDIAC) 20 MG/ML IV SOLN
INTRAVENOUS | Status: DC | PRN
  Administered 2015-08-18: 60 mg via INTRAVENOUS

## 2015-08-18 MED ORDER — PANTOPRAZOLE SODIUM 40 MG PO TBEC
40.0000 mg | DELAYED_RELEASE_TABLET | Freq: Two times a day (BID) | ORAL | Status: DC
  Administered 2015-08-18 – 2015-08-20 (×5): 40 mg via ORAL
  Filled 2015-08-18 (×5): qty 1

## 2015-08-18 MED ORDER — FERROUS SULFATE 325 (65 FE) MG PO TABS
325.0000 mg | ORAL_TABLET | Freq: Two times a day (BID) | ORAL | Status: DC
  Administered 2015-08-18 – 2015-08-20 (×4): 325 mg via ORAL
  Filled 2015-08-18 (×5): qty 1

## 2015-08-18 MED ORDER — BISACODYL 10 MG RE SUPP
10.0000 mg | Freq: Every day | RECTAL | Status: DC | PRN
  Administered 2015-08-19: 10 mg via RECTAL
  Filled 2015-08-18: qty 1

## 2015-08-18 MED ORDER — SODIUM CHLORIDE 0.9 % IJ SOLN
INTRAMUSCULAR | Status: AC
  Filled 2015-08-18: qty 50

## 2015-08-18 MED ORDER — PROPOFOL 500 MG/50ML IV EMUL
INTRAVENOUS | Status: DC | PRN
  Administered 2015-08-18: 50 ug/kg/min via INTRAVENOUS

## 2015-08-18 MED ORDER — BUPIVACAINE-EPINEPHRINE 0.25% -1:200000 IJ SOLN
INTRAMUSCULAR | Status: DC | PRN
  Administered 2015-08-18: 30 mL

## 2015-08-18 MED ORDER — TRAMADOL HCL 50 MG PO TABS
50.0000 mg | ORAL_TABLET | ORAL | Status: DC | PRN
  Administered 2015-08-18 – 2015-08-19 (×4): 50 mg via ORAL
  Filled 2015-08-18 (×4): qty 1

## 2015-08-18 MED ORDER — METOCLOPRAMIDE HCL 10 MG PO TABS
10.0000 mg | ORAL_TABLET | Freq: Three times a day (TID) | ORAL | Status: AC
  Administered 2015-08-18 – 2015-08-20 (×8): 10 mg via ORAL
  Filled 2015-08-18 (×8): qty 1

## 2015-08-18 MED ORDER — BUPIVACAINE HCL (PF) 0.5 % IJ SOLN
INTRAMUSCULAR | Status: DC | PRN
  Administered 2015-08-18: 3 mL

## 2015-08-18 MED ORDER — LACTATED RINGERS IV SOLN
INTRAVENOUS | Status: DC
  Administered 2015-08-18 (×2): via INTRAVENOUS

## 2015-08-18 MED ORDER — MIDAZOLAM HCL 5 MG/5ML IJ SOLN
INTRAMUSCULAR | Status: DC | PRN
  Administered 2015-08-18: 2 mg via INTRAVENOUS

## 2015-08-18 MED ORDER — ACETAMINOPHEN 10 MG/ML IV SOLN
INTRAVENOUS | Status: AC
  Filled 2015-08-18: qty 100

## 2015-08-18 MED ORDER — GLYCOPYRROLATE 0.2 MG/ML IJ SOLN
INTRAMUSCULAR | Status: DC | PRN
  Administered 2015-08-18: 0.2 mg via INTRAVENOUS

## 2015-08-18 MED ORDER — OMEGA-3-ACID ETHYL ESTERS 1 G PO CAPS
1.0000 g | ORAL_CAPSULE | Freq: Every day | ORAL | Status: DC
  Administered 2015-08-18 – 2015-08-20 (×3): 1 g via ORAL
  Filled 2015-08-18 (×3): qty 1

## 2015-08-18 MED ORDER — FENTANYL CITRATE (PF) 100 MCG/2ML IJ SOLN: 25.0000 ug | INTRAMUSCULAR | Status: DC | PRN

## 2015-08-18 MED ORDER — ACETAMINOPHEN 10 MG/ML IV SOLN
INTRAVENOUS | Status: DC | PRN
  Administered 2015-08-18: 1000 mg via INTRAVENOUS

## 2015-08-18 MED ORDER — NEOMYCIN-POLYMYXIN B GU 40-200000 IR SOLN
Status: DC | PRN
  Administered 2015-08-18: 14 mL

## 2015-08-18 MED ORDER — MAGNESIUM HYDROXIDE 400 MG/5ML PO SUSP
30.0000 mL | Freq: Every day | ORAL | Status: DC | PRN
  Administered 2015-08-19: 30 mL via ORAL
  Filled 2015-08-18: qty 30

## 2015-08-18 MED ORDER — ACETAMINOPHEN 10 MG/ML IV SOLN
1000.0000 mg | Freq: Four times a day (QID) | INTRAVENOUS | Status: AC
  Administered 2015-08-18 – 2015-08-19 (×4): 1000 mg via INTRAVENOUS
  Filled 2015-08-18 (×4): qty 100

## 2015-08-18 MED ORDER — KETAMINE HCL 50 MG/ML IJ SOLN
INTRAMUSCULAR | Status: DC | PRN
  Administered 2015-08-18 (×2): 25 mg via INTRAMUSCULAR

## 2015-08-18 MED ORDER — TRANEXAMIC ACID 1000 MG/10ML IV SOLN
1000.0000 mg | Freq: Once | INTRAVENOUS | Status: AC
  Administered 2015-08-18: 1000 mg via INTRAVENOUS
  Filled 2015-08-18: qty 10

## 2015-08-18 MED ORDER — SODIUM CHLORIDE FLUSH 0.9 % IV SOLN
INTRAVENOUS | Status: AC
  Administered 2015-08-18: 5 mL
  Filled 2015-08-18: qty 10

## 2015-08-18 MED ORDER — SODIUM CHLORIDE 0.9 % IV SOLN
INTRAVENOUS | Status: DC | PRN
  Administered 2015-08-18: 60 mL

## 2015-08-18 MED ORDER — BUPIVACAINE-EPINEPHRINE (PF) 0.25% -1:200000 IJ SOLN
INTRAMUSCULAR | Status: AC
  Filled 2015-08-18: qty 30

## 2015-08-18 MED ORDER — BUPIVACAINE LIPOSOME 1.3 % IJ SUSP
INTRAMUSCULAR | Status: AC
  Filled 2015-08-18: qty 20

## 2015-08-18 MED ORDER — CEFAZOLIN SODIUM-DEXTROSE 2-4 GM/100ML-% IV SOLN
2.0000 g | Freq: Four times a day (QID) | INTRAVENOUS | Status: AC
Start: 2015-08-18 — End: 2015-08-19
  Administered 2015-08-18 – 2015-08-19 (×4): 2 g via INTRAVENOUS
  Filled 2015-08-18 (×4): qty 100

## 2015-08-18 MED ORDER — CEFAZOLIN SODIUM-DEXTROSE 2-4 GM/100ML-% IV SOLN
INTRAVENOUS | Status: AC
  Filled 2015-08-18: qty 100

## 2015-08-18 MED ORDER — FLEET ENEMA 7-19 GM/118ML RE ENEM: 1.0000 | ENEMA | Freq: Once | RECTAL | Status: DC | PRN

## 2015-08-18 MED ORDER — ACETAMINOPHEN 650 MG RE SUPP: 650.0000 mg | Freq: Four times a day (QID) | RECTAL | Status: DC | PRN

## 2015-08-18 MED ORDER — TAMSULOSIN HCL 0.4 MG PO CAPS
0.4000 mg | ORAL_CAPSULE | ORAL | Status: DC
  Administered 2015-08-19 – 2015-08-20 (×2): 0.4 mg via ORAL
  Filled 2015-08-18 (×2): qty 1

## 2015-08-18 MED ORDER — ADULT MULTIVITAMIN W/MINERALS CH
1.0000 | ORAL_TABLET | Freq: Every day | ORAL | Status: DC
  Administered 2015-08-18 – 2015-08-20 (×3): 1 via ORAL
  Filled 2015-08-18 (×3): qty 1

## 2015-08-18 MED ORDER — MORPHINE SULFATE (PF) 2 MG/ML IV SOLN
2.0000 mg | INTRAVENOUS | Status: DC | PRN
  Filled 2015-08-18: qty 1

## 2015-08-18 MED ORDER — AMLODIPINE BESYLATE 10 MG PO TABS
10.0000 mg | ORAL_TABLET | Freq: Every day | ORAL | Status: DC
  Administered 2015-08-18 – 2015-08-19 (×2): 10 mg via ORAL
  Filled 2015-08-18 (×2): qty 1

## 2015-08-18 MED ORDER — ACETAMINOPHEN 325 MG PO TABS: 650.0000 mg | ORAL_TABLET | Freq: Four times a day (QID) | ORAL | Status: DC | PRN

## 2015-08-18 MED ORDER — SENNOSIDES-DOCUSATE SODIUM 8.6-50 MG PO TABS
1.0000 | ORAL_TABLET | Freq: Two times a day (BID) | ORAL | Status: DC
  Administered 2015-08-18 – 2015-08-20 (×5): 1 via ORAL
  Filled 2015-08-18 (×5): qty 1

## 2015-08-18 MED ORDER — ENOXAPARIN SODIUM 30 MG/0.3ML ~~LOC~~ SOLN
30.0000 mg | Freq: Two times a day (BID) | SUBCUTANEOUS | Status: DC
  Administered 2015-08-19 – 2015-08-20 (×3): 30 mg via SUBCUTANEOUS
  Filled 2015-08-18 (×3): qty 0.3

## 2015-08-18 MED ORDER — FENTANYL CITRATE (PF) 100 MCG/2ML IJ SOLN
INTRAMUSCULAR | Status: DC | PRN
  Administered 2015-08-18 (×2): 50 ug via INTRAVENOUS

## 2015-08-18 MED ORDER — ALUM & MAG HYDROXIDE-SIMETH 200-200-20 MG/5ML PO SUSP: 30.0000 mL | ORAL | Status: DC | PRN

## 2015-08-18 MED ORDER — OXYCODONE HCL 5 MG PO TABS
5.0000 mg | ORAL_TABLET | ORAL | Status: DC | PRN
  Administered 2015-08-18 – 2015-08-20 (×7): 5 mg via ORAL
  Filled 2015-08-18 (×7): qty 1

## 2015-08-18 MED ORDER — ATORVASTATIN CALCIUM 10 MG PO TABS
10.0000 mg | ORAL_TABLET | Freq: Every day | ORAL | Status: DC
  Administered 2015-08-18 – 2015-08-19 (×2): 10 mg via ORAL
  Filled 2015-08-18 (×2): qty 1

## 2015-08-18 MED ORDER — ONDANSETRON HCL 4 MG/2ML IJ SOLN: 4.0000 mg | Freq: Once | INTRAMUSCULAR | Status: DC | PRN

## 2015-08-18 MED ORDER — BUPIVACAINE HCL (PF) 0.25 % IJ SOLN
INTRAMUSCULAR | Status: AC
  Filled 2015-08-18: qty 30

## 2015-08-18 MED ORDER — DIPHENHYDRAMINE HCL 12.5 MG/5ML PO ELIX: 12.5000 mg | ORAL_SOLUTION | ORAL | Status: DC | PRN

## 2015-08-18 MED ORDER — ONDANSETRON HCL 4 MG PO TABS: 4.0000 mg | ORAL_TABLET | Freq: Four times a day (QID) | ORAL | Status: DC | PRN

## 2015-08-18 MED ORDER — MENTHOL 3 MG MT LOZG
1.0000 | LOZENGE | OROMUCOSAL | Status: DC | PRN
  Filled 2015-08-18: qty 9

## 2015-08-18 MED ORDER — BUPIVACAINE HCL (PF) 0.5 % IJ SOLN
INTRAMUSCULAR | Status: AC
  Filled 2015-08-18: qty 30

## 2015-08-18 MED ORDER — TRANEXAMIC ACID 1000 MG/10ML IV SOLN
1500.0000 mg | INTRAVENOUS | Status: AC
  Administered 2015-08-18: 1500 mg via INTRAVENOUS
  Filled 2015-08-18: qty 15

## 2015-08-18 MED ORDER — SERTRALINE HCL 50 MG PO TABS
25.0000 mg | ORAL_TABLET | Freq: Every day | ORAL | Status: DC
  Administered 2015-08-19: 25 mg via ORAL
  Filled 2015-08-18 (×2): qty 1

## 2015-08-18 MED ORDER — NEOMYCIN-POLYMYXIN B GU 40-200000 IR SOLN
Status: AC
  Filled 2015-08-18: qty 20

## 2015-08-18 SURGICAL SUPPLY — 59 items
AUTOTRANSFUS HAS 1/8 (MISCELLANEOUS) ×3
BATTERY INSTRU NAVIGATION (MISCELLANEOUS) ×12 IMPLANT
BLADE SAW 1 (BLADE) ×3 IMPLANT
BLADE SAW 1/2 (BLADE) ×3 IMPLANT
BTRY SRG DRVR LF (MISCELLANEOUS) ×4
CANISTER SUCT 1200ML W/VALVE (MISCELLANEOUS) ×3 IMPLANT
CANISTER SUCT 3000ML (MISCELLANEOUS) ×6 IMPLANT
CAP KNEE TOTAL 3 SIGMA ×2 IMPLANT
CATH TRAY METER 16FR LF (MISCELLANEOUS) ×3 IMPLANT
CEMENT HV SMART SET (Cement) ×6 IMPLANT
COOLER POLAR GLACIER W/PUMP (MISCELLANEOUS) ×3 IMPLANT
DRAPE SHEET LG 3/4 BI-LAMINATE (DRAPES) ×3 IMPLANT
DRSG DERMACEA 8X12 NADH (GAUZE/BANDAGES/DRESSINGS) ×3 IMPLANT
DRSG OPSITE POSTOP 4X14 (GAUZE/BANDAGES/DRESSINGS) ×3 IMPLANT
DRSG TEGADERM 4X4.75 (GAUZE/BANDAGES/DRESSINGS) ×3 IMPLANT
DURAPREP 26ML APPLICATOR (WOUND CARE) ×6 IMPLANT
ELECT CAUTERY BLADE 6.4 (BLADE) ×3 IMPLANT
ELECT REM PT RETURN 9FT ADLT (ELECTROSURGICAL) ×3
ELECTRODE REM PT RTRN 9FT ADLT (ELECTROSURGICAL) ×1 IMPLANT
EX-PIN ORTHOLOCK NAV 4X150 (PIN) ×6 IMPLANT
GLOVE BIOGEL M STRL SZ7.5 (GLOVE) ×6 IMPLANT
GLOVE INDICATOR 8.0 STRL GRN (GLOVE) ×3 IMPLANT
GLOVE SURG 9.0 ORTHO LTXF (GLOVE) ×3 IMPLANT
GLOVE SURG ORTHO 9.0 STRL STRW (GLOVE) ×3 IMPLANT
GOWN STRL REUS W/ TWL LRG LVL3 (GOWN DISPOSABLE) ×2 IMPLANT
GOWN STRL REUS W/TWL 2XL LVL3 (GOWN DISPOSABLE) ×3 IMPLANT
GOWN STRL REUS W/TWL LRG LVL3 (GOWN DISPOSABLE) ×6
HANDPIECE SUCTION TUBG SURGILV (MISCELLANEOUS) ×3 IMPLANT
HOLDER FOLEY CATH W/STRAP (MISCELLANEOUS) ×3 IMPLANT
HOOD PEEL AWAY FLYTE STAYCOOL (MISCELLANEOUS) ×6 IMPLANT
KIT RM TURNOVER STRD PROC AR (KITS) ×3 IMPLANT
KNIFE SCULPS 14X20 (INSTRUMENTS) ×3 IMPLANT
NDL SAFETY 18GX1.5 (NEEDLE) ×3 IMPLANT
NDL SPNL 20GX3.5 QUINCKE YW (NEEDLE) ×1 IMPLANT
NEEDLE SPNL 20GX3.5 QUINCKE YW (NEEDLE) ×3 IMPLANT
NS IRRIG 500ML POUR BTL (IV SOLUTION) ×3 IMPLANT
PACK TOTAL KNEE (MISCELLANEOUS) ×3 IMPLANT
PAD WRAPON POLAR KNEE (MISCELLANEOUS) ×1 IMPLANT
PIN DRILL QUICK PACK ×3 IMPLANT
PIN FIXATION 1/8DIA X 3INL (PIN) ×3 IMPLANT
SOL .9 NS 3000ML IRR  AL (IV SOLUTION) ×2
SOL .9 NS 3000ML IRR AL (IV SOLUTION) ×1
SOL .9 NS 3000ML IRR UROMATIC (IV SOLUTION) ×1 IMPLANT
SOL PREP PVP 2OZ (MISCELLANEOUS) ×3
SOLUTION PREP PVP 2OZ (MISCELLANEOUS) ×1 IMPLANT
SPONGE DRAIN TRACH 4X4 STRL 2S (GAUZE/BANDAGES/DRESSINGS) ×3 IMPLANT
STAPLER SKIN PROX 35W (STAPLE) ×3 IMPLANT
SUCTION FRAZIER HANDLE 10FR (MISCELLANEOUS) ×2
SUCTION TUBE FRAZIER 10FR DISP (MISCELLANEOUS) ×1 IMPLANT
SUT VIC AB 0 CT1 36 (SUTURE) ×3 IMPLANT
SUT VIC AB 1 CT1 36 (SUTURE) ×6 IMPLANT
SUT VIC AB 2-0 CT2 27 (SUTURE) ×3 IMPLANT
SYR 20CC LL (SYRINGE) ×3 IMPLANT
SYR 30ML LL (SYRINGE) ×3 IMPLANT
SYR 50ML LL SCALE MARK (SYRINGE) ×3 IMPLANT
SYSTEM AUTOTRANSFUS DUAL TROCR (MISCELLANEOUS) ×1 IMPLANT
TOWEL OR 17X26 4PK STRL BLUE (TOWEL DISPOSABLE) ×3 IMPLANT
TOWER CARTRIDGE SMART MIX (DISPOSABLE) ×3 IMPLANT
WRAPON POLAR PAD KNEE (MISCELLANEOUS) ×3

## 2015-08-18 NOTE — Op Note (Signed)
OPERATIVE NOTE  DATE OF SURGERY:  08/18/2015  PATIENT NAME:  David Mcgee   DOB: 11-Aug-1945  MRN: [id number]  PRE-OPERATIVE DIAGNOSIS: Degenerative arthrosis of the right knee, primary  POST-OPERATIVE DIAGNOSIS:  Same  PROCEDURE:  Right total knee arthroplasty using computer-assisted navigation  SURGEON:  Marciano Sequin. M.D.  ASSISTANT:  Vance Peper, PA (present and scrubbed throughout the case, critical for assistance with exposure, retraction, instrumentation, and closure)  ANESTHESIA: spinal  ESTIMATED BLOOD LOSS: 100 mL  FLUIDS REPLACED: 1500 mL of crystalloid  TOURNIQUET TIME: 109 minutes  DRAINS: 2 medium drains to a reinfusion system  SOFT TISSUE RELEASES: Anterior cruciate ligament, posterior cruciate ligament, deep and superficial medial collateral ligament, patellofemoral ligament   IMPLANTS UTILIZED: DePuy PFC Sigma size 5 posterior stabilized femoral component (cemented), size 5 MBT tibial component (cemented), 41 mm 3 peg oval dome patella (cemented), and a 15 mm stabilized rotating platform polyethylene insert.  INDICATIONS FOR SURGERY: David Mcgee is a 70 y.o. year old male with a long history of progressive knee pain. X-rays demonstrated severe degenerative changes in tricompartmental fashion. The patient had not seen any significant improvement despite conservative nonsurgical intervention. After discussion of the risks and benefits of surgical intervention, the patient expressed understanding of the risks benefits and agree with plans for total knee arthroplasty.   The risks, benefits, and alternatives were discussed at length including but not limited to the risks of infection, bleeding, nerve injury, stiffness, blood clots, the need for revision surgery, cardiopulmonary complications, among others, and they were willing to proceed.  PROCEDURE IN DETAIL: The patient was brought into the operating room and, after adequate spinal anesthesia was achieved, a  tourniquet was placed on the patient's upper thigh. The patient's knee and leg were cleaned and prepped with alcohol and DuraPrep and draped in the usual sterile fashion. A "timeout" was performed as per usual protocol. The lower extremity was exsanguinated using an Esmarch, and the tourniquet was inflated to 300 mmHg. An anterior longitudinal incision was made followed by a standard mid vastus approach. The deep fibers of the medial collateral ligament were elevated in a subperiosteal fashion off of the medial flare of the tibia so as to maintain a continuous soft tissue sleeve. The patella was subluxed laterally and the patellofemoral ligament was incised. Inspection of the knee demonstrated severe degenerative changes with full-thickness loss of articular cartilage. Osteophytes were debrided using a rongeur. Anterior and posterior cruciate ligaments were excised. Two 4.0 mm Schanz pins were inserted in the femur and into the tibia for attachment of the array of trackers used for computer-assisted navigation. Hip center was identified using a circumduction technique. Distal landmarks were mapped using the computer. The distal femur and proximal tibia were mapped using the computer. The distal femoral cutting guide was positioned using computer-assisted navigation so as to achieve a 5 distal valgus cut. The femur was sized and it was felt that a size 5 femoral component was appropriate. A size 5 femoral cutting guide was positioned and the anterior cut was performed and verified using the computer. This was followed by completion of the posterior and chamfer cuts. Femoral cutting guide for the central box was then positioned in the center box cut was performed.  Attention was then directed to the proximal tibia. Medial and lateral menisci were excised. The extramedullary tibial cutting guide was positioned using computer-assisted navigation so as to achieve a 0 varus-valgus alignment and 0 posterior slope. The  cut was performed  and verified using the computer. The proximal tibia was sized and it was felt that a size 5 tibial tray was appropriate. Tibial and femoral trials were inserted followed by insertion of a 10 and subsequently 12.5 mm polyethylene insert. The knee was felt to be tight medially. A Cobb elevator was used to elevate the superficial fibers of the medial collateral ligament. The 12.5 mm polyethylene was replaced by a 15 mm polyethylene insert This allowed for excellent mediolateral soft tissue balancing both in flexion and in full extension. Finally, the patella was cut and prepared so as to accommodate a 41 mm 3 peg oval dome patella. A patella trial was placed and the knee was placed through a range of motion with excellent patellar tracking appreciated. The femoral trial was removed after debridement of posterior osteophytes. The central post-hole for the tibial component was reamed followed by insertion of a keel punch. Tibial trials were then removed. Cut surfaces of bone were irrigated with copious amounts of normal saline with antibiotic solution using pulsatile lavage and then suctioned dry. Polymethylmethacrylate cement was prepared in the usual fashion using a vacuum mixer. Cement was applied to the cut surface of the proximal tibia as well as along the undersurface of a size 5 MBT tibial component. Tibial component was positioned and impacted into place. Excess cement was removed using [employer] fast streamer. Cement was then applied to the cut surfaces of the femur as well as along the posterior flanges of the size 5 femoral component. The femoral component was positioned and impacted into place. Excess cement was removed using [employer] fast streamer. A 15 mm polyethylene trial was inserted and the knee was brought into full extension with steady axial compression applied. Finally, cement was applied to the backside of a 41 mm 3 peg oval dome patella and the patellar component was positioned and patellar  clamp applied. Excess cement was removed using [employer] fast streamer. After adequate curing of the cement, the tourniquet was deflated after a total tourniquet time of 109 minutes. Hemostasis was achieved using electrocautery. The knee was irrigated with copious amounts of normal saline with antibiotic solution using pulsatile lavage and then suctioned dry. 20 mL of 1.3% Exparel in 40 mL of normal saline was injected along the posterior capsule, medial and lateral gutters, and along the arthrotomy site. A 15 mm stabilized rotating platform polyethylene insert was inserted and the knee was placed through a range of motion with excellent mediolateral soft tissue balancing appreciated and excellent patellar tracking noted. 2 medium drains were placed in the wound bed and brought out through separate stab incisions to be attached to a reinfusion system. The medial parapatellar portion of the incision was reapproximated using interrupted sutures of #1 Vicryl. Subcutaneous tissue was then injected with a total of 30 cc of 0.25% Marcaine with epinephrine. Subcutaneous tissue was approximated in layers using first #0 Vicryl followed #2-0 Vicryl. The skin was approximated with skin staples. A sterile dressing was applied.  The patient tolerated the procedure well and was transported to the recovery room in stable condition.    Demetrios Byron P. Holley Bouche., M.D.

## 2015-08-18 NOTE — Evaluation (Signed)
Physical Therapy Evaluation Patient Details Name: David Mcgee MRN: [id number] DOB: [birth year] Today's Date: 08/18/2015   History of Present Illness  Pt is a very pleasant 70 y/o male that presents for R TKR on 3/29.   Clinical Impression  Patient presents s/p R TKR on POD#0. He has had L TKR several years ago and did quite well afterwards. He has been quite independent prior to this admission, and demonstrates good sitting balance and no loss of balance with ambulation. PT was able to wean patient off O2 during this session (98% on room air). Patient demonstrates balance deficits in standing, decreased ability to push off RLE during sit to stand transfer. Patient able to tolerate short step to pattern gait for pain control. Patient appears to be progressing well towards mobility goals.      Follow Up Recommendations Home health PT    Equipment Recommendations       Recommendations for Other Services       Precautions / Restrictions Precautions Precautions: Fall Restrictions Weight Bearing Restrictions: Yes RLE Weight Bearing: Weight bearing as tolerated      Mobility  Bed Mobility Overal bed mobility: Needs Assistance Bed Mobility: Supine to Sit     Supine to sit: Supervision;HOB elevated     General bed mobility comments: Educated patient on hooking method for LLE underneath RLE, able to complete transfer via UEs with HOB elevated.   Transfers Overall transfer level: Needs assistance Equipment used: Rolling walker (2 wheeled) Transfers: Sit to/from Stand Sit to Stand: Supervision         General transfer comment: Patient required 2-3 attempts to complete transfer, no loss of balance. PT demonstrated sequencing and hand placement prior to transfer.   Ambulation/Gait Ambulation/Gait assistance: Supervision Ambulation Distance (Feet): 5 Feet Assistive device: Rolling walker (2 wheeled) Gait Pattern/deviations: Step-to pattern   Gait velocity interpretation: Below  normal speed for age/gender General Gait Details: Patient educated on step to gait pattern, no loss of balance, appropriate weight acceptance onto RLE noted. No buckling noted.   Stairs            Wheelchair Mobility    Modified Rankin (Stroke Patients Only)       Balance Overall balance assessment: Needs assistance Sitting-balance support: No upper extremity supported Sitting balance-Leahy Scale: Normal     Standing balance support: Bilateral upper extremity supported Standing balance-Leahy Scale: Fair                               Pertinent Vitals/Pain Pain Assessment: Faces Faces Pain Scale: Hurts a little bit Pain Location: R TKR Pain Descriptors / Indicators: Operative site guarding;Aching Pain Intervention(s): Limited activity within patient's tolerance;Monitored during session;Premedicated before session;Ice applied    Home Living Family/patient expects to be discharged to:: Private residence Living Arrangements: Spouse/significant other Available Help at Discharge: Family;Available 24 hours/day Type of Home: House Home Access: Stairs to enter Entrance Stairs-Rails: None Entrance Stairs-Number of Steps: 3 Home Layout: One level Home Equipment: Walker - 2 wheels      Prior Function Level of Independence: Independent         Comments: Patient has been independent prior to admission.      Hand Dominance        Extremity/Trunk Assessment   Upper Extremity Assessment: Overall WFL for tasks assessed           Lower Extremity Assessment: RLE deficits/detail RLE Deficits / Details: Able to  complete 10 SLRs       Communication   Communication: No difficulties  Cognition Arousal/Alertness: Awake/alert Behavior During Therapy: WFL for tasks assessed/performed Overall Cognitive Status: Within Functional Limits for tasks assessed                      General Comments General comments (skin integrity, edema, etc.): Drains  in tact, all sensation/motor re-established prior to therapy.     Exercises Total Joint Exercises Ankle Circles/Pumps: AROM;Both;10 reps Heel Slides: AROM;10 reps;Both Straight Leg Raises: AROM;Both;10 reps Long Arc Quad: AROM;Both;10 reps Goniometric ROM: 1-70      Assessment/Plan    PT Assessment Patient needs continued PT services  PT Diagnosis Difficulty walking   PT Problem List Decreased strength;Decreased mobility;Decreased range of motion;Decreased balance  PT Treatment Interventions Gait training;DME instruction;Therapeutic activities;Therapeutic exercise;Balance training;Stair training;Manual techniques   PT Goals (Current goals can be found in the Care Plan section) Acute Rehab PT Goals Patient Stated Goal: To return home  PT Goal Formulation: With patient Time For Goal Achievement: 09/01/15 Potential to Achieve Goals: Good    Frequency BID   Barriers to discharge        Co-evaluation               End of Session Equipment Utilized During Treatment: Gait belt Activity Tolerance: Patient tolerated treatment well Patient left: in chair;with call bell/phone within reach;with family/visitor present;with chair alarm set Nurse Communication: Mobility status         Time: [id number] PT Time Calculation (min) (ACUTE ONLY): 32 min   Charges:   PT Evaluation $PT Eval Moderate Complexity: 1 Procedure PT Treatments $Therapeutic Exercise: 8-22 mins   PT G Codes:       Kerman Passey, PT, DPT    08/18/2015, 3:16 PM

## 2015-08-18 NOTE — Transfer of Care (Signed)
Immediate Anesthesia Transfer of Care Note  Patient: David Mcgee  Procedure(s) Performed: Procedure(s): COMPUTER ASSISTED TOTAL KNEE ARTHROPLASTY (Right)  Patient Location: PACU  Anesthesia Type:spinal  Level of Consciousness: sedated  Airway & Oxygen Therapy: Patient Spontanous Breathing and Patient connected to face mask oxygen  Post-op Assessment: Report given to RN and Post -op Vital signs reviewed and stable  Post vital signs: Reviewed and stable  Last Vitals:  Filed Vitals:   08/18/15 0612 08/18/15 1058  BP: 157/89 105/65  Pulse: 65 52  Temp: 36.7 C 37.1 C  Resp: 14 8    Complications: No apparent anesthesia complications

## 2015-08-18 NOTE — Brief Op Note (Signed)
08/18/2015  11:15 AM  PATIENT:  David Mcgee  70 y.o. male  PRE-OPERATIVE DIAGNOSIS:  Degenerative arthritis of the right knee  POST-OPERATIVE DIAGNOSIS: Same  PROCEDURE:  Procedure(s): COMPUTER ASSISTED TOTAL KNEE ARTHROPLASTY (Right)  SURGEON:  Surgeon(s) and Role:    * Dereck Leep, MD - Primary  ASSISTANTS: Vance Peper, PA   ANESTHESIA:   spinal  EBL:  Total I/O In: 1500 [I.V.:1500] Out: 750 [Urine:650; Blood:100]  BLOOD ADMINISTERED:none  DRAINS: 2 medium drains to a reinfusion system   LOCAL MEDICATIONS USED:  MARCAINE    and OTHER Exparel  SPECIMEN:  No Specimen  DISPOSITION OF SPECIMEN:  N/A  COUNTS:  YES  TOURNIQUET:   109 minutes  DICTATION: .Dragon Dictation  PLAN OF CARE: Admit to inpatient   PATIENT DISPOSITION:  PACU - hemodynamically stable.   Delay start of Pharmacological VTE agent (>24hrs) due to surgical blood loss or risk of bleeding: yes

## 2015-08-18 NOTE — NC FL2 (Signed)
[place] LEVEL OF CARE SCREENING TOOL     IDENTIFICATION  Patient Name: David Mcgee Birthdate: 07/11/1945 Sex: male Admission Date (Current Location): 08/18/2015  [place] and [place] Number:  Engineering geologist and Address:  [facility], [address], [place], [place] [address]      Provider Number: [id number]  Attending Physician Name and Address:  Dereck Leep, MD  Relative Name and Phone Number:       Current Level of Care: Hospital Recommended Level of Care: [place] Prior Approval Number:    Date Approved/Denied:   PASRR Number:  ([id number])  Discharge Plan: SNF    Current Diagnoses: Patient Active Problem List   Diagnosis Date Noted  . 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  . Advance care planning 12/09/2014  . Elevated serum creatinine 12/09/2014  . Hyperglycemia 12/09/2014  . Atypical chest pain 02/23/2014  . PTSD (post-traumatic stress disorder) 01/24/2013  . Hyperlipidemia 11/16/2011  . Medicare annual wellness visit, subsequent 10/24/2011  . PALPITATIONS 06/10/2009  . LOW BACK PAIN, ACUTE 01/01/2009  . CARCINOMA, BASAL CELL 10/01/2008  . 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 HYPERTRIGLYCERIDEMIA 10/03/2007  . Essential hypertension 10/03/2007  . GERD 10/03/2007  . Knee pain 10/03/2007    Orientation RESPIRATION BLADDER Height & Weight     Self, Time, Situation, Place  O2 (2 Liters Oxygen ) Continent Weight: 221 lb 11.2 oz (100.562 kg) Height:  6' 2.5" (189.2 cm)  BEHAVIORAL SYMPTOMS/MOOD NEUROLOGICAL BOWEL NUTRITION STATUS   (none )  (none ) Continent Diet (Regular Diet )  AMBULATORY STATUS COMMUNICATION OF NEEDS Skin   Extensive Assist Verbally Surgical wounds (Incision: Right Knee )                       Personal Care Assistance Level of Assistance  Bathing, Feeding, Dressing Bathing Assistance: Limited assistance Feeding assistance: Independent Dressing Assistance: Limited  assistance     Functional Limitations Info  Sight, Hearing, Speech Sight Info: Adequate Hearing Info: Impaired Speech Info: Adequate    SPECIAL CARE FACTORS FREQUENCY  PT (By licensed PT)     PT Frequency:  (5) OT Frequency:  (5)            Contractures      Additional Factors Info  Code Status, Allergies Code Status Info:  (Not on File ) Allergies Info:  (Azithromycin)           Current Medications (08/18/2015):  This is the current hospital active medication list Current Facility-Administered Medications  Medication Dose Route Frequency Provider Last Rate Last Dose  . 0.9 %  sodium chloride infusion   Intravenous Continuous Dereck Leep, MD 100 mL/hr at 08/18/15 1228    . acetaminophen (OFIRMEV) IV 1,000 mg  1,000 mg Intravenous 4 times per day Dereck Leep, MD   1,000 mg at 08/18/15 1404  . [START ON 08/19/2015] acetaminophen (TYLENOL) tablet 650 mg  650 mg Oral Q6H PRN Dereck Leep, MD       Or  . Derrill Memo ON 08/19/2015] acetaminophen (TYLENOL) suppository 650 mg  650 mg Rectal Q6H PRN Dereck Leep, MD      . alum & mag hydroxide-simeth (MAALOX/MYLANTA) 200-200-20 MG/5ML suspension 30 mL  30 mL Oral Q4H PRN Dereck Leep, MD      . amLODipine (NORVASC) tablet 10 mg  10 mg Oral QHS Dereck Leep, MD      . atorvastatin (LIPITOR) tablet  10 mg  10 mg Oral QHS Dereck Leep, MD      . bisacodyl (DULCOLAX) suppository 10 mg  10 mg Rectal Daily PRN Dereck Leep, MD      . ceFAZolin (ANCEF) 2-4 GM/100ML-% IVPB           . ceFAZolin (ANCEF) IVPB 2g/100 mL premix  2 g Intravenous Q6H Dereck Leep, MD      . diphenhydrAMINE (BENADRYL) 12.5 MG/5ML elixir 12.5-25 mg  12.5-25 mg Oral Q4H PRN Dereck Leep, MD      . Derrill Memo ON 08/19/2015] enoxaparin (LOVENOX) injection 30 mg  30 mg Subcutaneous Q12H Dereck Leep, MD      . ferrous sulfate tablet 325 mg  325 mg Oral BID WC Dereck Leep, MD      . magnesium hydroxide (MILK OF MAGNESIA) suspension 30 mL  30 mL Oral  Daily PRN Dereck Leep, MD      . menthol-cetylpyridinium (CEPACOL) lozenge 3 mg  1 lozenge Oral PRN Dereck Leep, MD       Or  . phenol (CHLORASEPTIC) mouth spray 1 spray  1 spray Mouth/Throat PRN Dereck Leep, MD      . metoCLOPramide (REGLAN) tablet 10 mg  10 mg Oral TID AC & HS Dereck Leep, MD   10 mg at 08/18/15 1228  . morphine 2 MG/ML injection 2 mg  2 mg Intravenous Q2H PRN Dereck Leep, MD      . multivitamin with minerals tablet 1 tablet  1 tablet Oral Daily Dereck Leep, MD   1 tablet at 08/18/15 1408  . omega-3 acid ethyl esters (LOVAZA) capsule 1 g  1 g Oral Daily Dereck Leep, MD   1 g at 08/18/15 1408  . ondansetron (ZOFRAN) tablet 4 mg  4 mg Oral Q6H PRN Dereck Leep, MD       Or  . ondansetron (ZOFRAN) injection 4 mg  4 mg Intravenous Q6H PRN Dereck Leep, MD      . oxyCODONE (Oxy IR/ROXICODONE) immediate release tablet 5-10 mg  5-10 mg Oral Q4H PRN Dereck Leep, MD   5 mg at 08/18/15 1228  . pantoprazole (PROTONIX) EC tablet 40 mg  40 mg Oral BID Dereck Leep, MD   40 mg at 08/18/15 1408  . senna-docusate (Senokot-S) tablet 1 tablet  1 tablet Oral BID Dereck Leep, MD   1 tablet at 08/18/15 1408  . sertraline (ZOLOFT) tablet 25 mg  25 mg Oral QHS Dereck Leep, MD      . sodium phosphate (FLEET) 7-19 GM/118ML enema 1 enema  1 enema Rectal Once PRN Dereck Leep, MD      . Derrill Memo ON 08/19/2015] tamsulosin (FLOMAX) capsule 0.4 mg  0.4 mg Oral BH-q7a Dereck Leep, MD      . traMADol Veatrice Bourbon) tablet 50-100 mg  50-100 mg Oral Q4H PRN Dereck Leep, MD   50 mg at 08/18/15 1412     Discharge Medications: Please see discharge summary for a list of discharge medications.  Relevant Imaging Results:  Relevant Lab Results:   Additional Information  (SSN: [id number])  Loralyn Freshwater, LCSW

## 2015-08-18 NOTE — H&P (Signed)
The patient has been re-examined, and the chart reviewed, and there have been no interval changes to the documented history and physical.    The risks, benefits, and alternatives have been discussed at length. The patient expressed understanding of the risks benefits and agreed with plans for surgical intervention.  James P. Hooten, Jr. M.D.    

## 2015-08-18 NOTE — Anesthesia Procedure Notes (Addendum)
Spinal Patient location during procedure: OR Start time: 08/18/2015 7:12 AM End time: 08/18/2015 7:16 AM Staffing Anesthesiologist: Gunnar Fusi Resident/CRNA: Demetrius Charity Performed by: resident/CRNA  Preanesthetic Checklist Completed: patient identified, site marked, surgical consent, pre-op evaluation, timeout performed, IV checked, risks and benefits discussed and monitors and equipment checked Spinal Block Patient position: sitting Prep: Betadine Patient monitoring: heart rate, continuous pulse ox, blood pressure and cardiac monitor Approach: midline Location: L2-3 Injection technique: single-shot Needle Needle type: Whitacre and Introducer  Needle gauge: 25 G Needle length: 9 cm Additional Notes Negative paresthesia. Negative blood return. Positive free-flowing CSF. Expiration date of kit checked and confirmed. Patient tolerated procedure well, without complications.    Performed by: Demetrius Charity Pre-anesthesia Checklist: Patient being monitored, Suction available, Emergency Drugs available, Patient identified and Timeout performed Oxygen Delivery Method: Simple face mask

## 2015-08-18 NOTE — Anesthesia Preprocedure Evaluation (Signed)
Anesthesia Evaluation  Patient identified by MRN, date of birth, ID band Patient awake    Reviewed: Allergy & Precautions, NPO status , Patient's Chart, lab work & pertinent test results  History of Anesthesia Complications Negative for: history of anesthetic complications  Airway Mallampati: I       Dental  (+) Partial Lower   Pulmonary neg pulmonary ROS, former smoker,           Cardiovascular hypertension, Pt. on medications + DOE (palpitations)       Neuro/Psych Depression negative neurological ROS     GI/Hepatic Neg liver ROS, hiatal hernia, GERD  Medicated and Controlled,  Endo/Other  negative endocrine ROS  Renal/GU Renal InsufficiencyRenal disease (stones)     Musculoskeletal   Abdominal   Peds  Hematology negative hematology ROS (+)   Anesthesia Other Findings   Reproductive/Obstetrics                             Anesthesia Physical Anesthesia Plan  ASA: II  Anesthesia Plan: Spinal   Post-op Pain Management:    Induction: Intravenous  Airway Management Planned: Nasal Cannula  Additional Equipment:   Intra-op Plan:   Post-operative Plan:   Informed Consent: I have reviewed the patients History and Physical, chart, labs and discussed the procedure including the risks, benefits and alternatives for the proposed anesthesia with the patient or authorized representative who has indicated his/her understanding and acceptance.     Plan Discussed with:   Anesthesia Plan Comments:         Anesthesia Quick Evaluation

## 2015-08-19 LAB — BASIC METABOLIC PANEL
Anion gap: 5 (ref 5–15)
BUN: 12 mg/dL (ref 6–20)
CALCIUM: 8.3 mg/dL — AB (ref 8.9–10.3)
CHLORIDE: 102 mmol/L (ref 101–111)
CO2: 27 mmol/L (ref 22–32)
CREATININE: 1.16 mg/dL (ref 0.61–1.24)
GFR calc non Af Amer: >60 mL/min (ref >60)
GLUCOSE: 125 mg/dL — AB (ref 65–99)
Potassium: 3.5 mmol/L (ref 3.5–5.1)
Sodium: 134 mmol/L — ABNORMAL LOW (ref 135–145)

## 2015-08-19 LAB — CBC
HEMATOCRIT: 43 % (ref 40.0–52.0)
HEMOGLOBIN: 14.8 g/dL (ref 13.0–18.0)
MCH: 30.5 pg (ref 26.0–34.0)
MCHC: 34.5 g/dL (ref 32.0–36.0)
MCV: 88.4 fL (ref 80.0–100.0)
Platelets: 138 10*3/uL — ABNORMAL LOW (ref 150–440)
RBC: 4.86 MIL/uL (ref 4.40–5.90)
RDW: 14 % (ref 11.5–14.5)
WBC: 8.9 10*3/uL (ref 3.8–10.6)

## 2015-08-19 MED ORDER — ENOXAPARIN SODIUM 40 MG/0.4ML ~~LOC~~ SOLN: 40.0000 mg | SUBCUTANEOUS | Status: DC

## 2015-08-19 MED ORDER — TRAMADOL HCL 50 MG PO TABS: 50.0000 mg | ORAL_TABLET | ORAL | Status: DC | PRN

## 2015-08-19 MED ORDER — OXYCODONE HCL 5 MG PO TABS: 5.0000 mg | ORAL_TABLET | ORAL | Status: DC | PRN

## 2015-08-19 NOTE — Progress Notes (Signed)
   Subjective: 1 Day Post-Op Procedure(s) (LRB): COMPUTER ASSISTED TOTAL KNEE ARTHROPLASTY (Right) Patient reports pain as 4 on 0-10 scale.   Patient is well, and has had no acute complaints or problems We will start therapy today.  Plan is to go Home after hospital stay. no nausea and no vomiting Patient denies any chest pains or shortness of breath. Objective: Vital signs in last 24 hours: Temp:  [97.4 F (36.3 C)-98.8 F (37.1 C)] 97.8 F (36.6 C) (03/30 0514) Pulse Rate:  [48-59] 53 (03/30 0514) Resp:  [8-18] 18 (03/29 2030) BP: (104-148)/(65-81) 131/74 mmHg (03/30 0514) SpO2:  [97 %-100 %] 97 % (03/30 0514) FiO2 (%):  [28 %] 28 % (03/29 1201) Weight:  [100.562 kg (221 lb 11.2 oz)] 100.562 kg (221 lb 11.2 oz) (03/29 1228) Unable to evaluate the wound on today's visit secondary to original dressing being in place. Heels are non tender and elevated off the bed using rolled towels Intake/Output from previous day: 03/29 0701 - 03/30 0700 In: 4885 [P.O.:820; I.V.:3255; IV Piggyback:810] Out: [id number] [Urine:3605; Drains:360; Blood:100] Intake/Output this shift: Total I/O In: 165 [I.V.:165] Out: -    Recent Labs  08/19/15 0557  HGB 14.8    Recent Labs  08/19/15 0557  WBC 8.9  RBC 4.86  HCT 43.0  PLT 138*    Recent Labs  08/19/15 0557  NA 134*  K 3.5  CL 102  CO2 27  BUN 12  CREATININE 1.16  GLUCOSE 125*  CALCIUM 8.3*   No results for input(s): LABPT, INR in the last 72 hours.  EXAM General - Patient is Alert, Appropriate and Oriented Extremity - Neurologically intact Neurovascular intact Sensation intact distally Intact pulses distally Dorsiflexion/Plantar flexion intact Dressing - dressing C/D/I Motor Function - intact, moving foot and toes well on exam.  Able to do straight leg raise on his own.  Past Medical History  Diagnosis Date  . Palpitations   . GERD (gastroesophageal reflux disease)   . Insomnia   . PTSD (post-traumatic stress  disorder)   . Spinal cord cysts   . Hypertension   . History of stress test     long time ago- wnl  . Depression     ? panic attacks, takes sertraline & also sees psych q 3 months   . H/O hiatal hernia   . Arthritis     back, knees, hands - OA  . PTSD (post-traumatic stress disorder)     post [place]  . 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 Cancer (HCC)     Skin, not melanoma    Assessment/Plan: 1 Day Post-Op Procedure(s) (LRB): COMPUTER ASSISTED TOTAL KNEE ARTHROPLASTY (Right) Active Problems:   S/P total knee arthroplasty  Estimated body mass index is 28.09 kg/(m^2) as calculated from the following:   Height as of this encounter: 6' 2.5" (1.892 m).   Weight as of this encounter: 100.562 kg (221 lb 11.2 oz). Advance diet Up with therapy D/C IV fluids Plan for discharge tomorrow Discharge home with home health  Labs: Were reviewed DVT Prophylaxis - Lovenox, Foot Pumps and TED hose Weight-Bearing as tolerated to right leg D/C O2 and Pulse OX and try on Room Air Begin working on a bowel movement Labs in a.m.  Jillyn Ledger. [place] 08/19/2015, 7:40 AM

## 2015-08-19 NOTE — Progress Notes (Signed)
Patient's up and ambulating in hallway with PT.  

## 2015-08-19 NOTE — Care Management Note (Signed)
Case Management Note  Patient Details  Name: [name] MRN: [id number] Date of Birth: Sep 06, 1945  Subjective/Objective:                   Met with patient to discuss discharge planning. He would like to return home with his wife Blanch Media followed by [place] home [facility]. He has had orthopedic surgery before and has DME (walker) available for use in the home. He has also administered Lovenox injections in the past and not concerned about that. He is typically independent with mobility. He uses [facility] for Rx [id number]  Action/Plan: List of home health agencies left with patient. Referral to [facility]. Lovenox 53m #14 called in to [place] RNCM will continue to follow.   Expected Discharge Date:                  Expected Discharge Plan:     In-House Referral:     Discharge planning Services  CM Consult  Post Acute Care Choice:  Home Health Choice offered to:  Patient  DME Arranged:    DME Agency:     HH Arranged:  PT HH Agency[place] Status of Service:  In process, will continue to follow  Medicare Important Message Given:    Date Medicare IM Given:    Medicare IM give by:    Date Additional Medicare IM Given:    Additional Medicare Important Message give by:     If discussed at [place] Meetings, dates discussed:    Additional Comments:  AMarshell Garfinkel RN 08/19/2015, 11:19 AM

## 2015-08-19 NOTE — Progress Notes (Signed)
Clinical Social Worker (CSW) received SNF consult. PT is recommending home health. RN Case Manager is aware of above. Please reconsult if future social work needs arise. CSW signing off.   Brach Birdsall Morgan, LCSW [phone] 

## 2015-08-19 NOTE — Evaluation (Signed)
Occupational Therapy Evaluation Patient Details Name: David Mcgee MRN: [id number] DOB: Jan 15, 1946 Today's Date: 08/19/2015    History of Present Illness Pt is a very pleasant 70 y/o male that presents for R TKR on 3/29.    Clinical Impression   Pt is 70 year old Male s/p right TKR.  Pt was independent in all ADLs/IADLs prior to surgery and is eager to return to his PLOF.  Pt currently requires CGA to perform LE ADLs while seated at the EOB due to pain and limited AROM of the right knee. Pt. Requires SBA for functional transfers. Pt would benefit from continued instruction in dressing techniques with or without assistive devices.  Pt. Continues to benefit from recommendations for home modifications to increase safety in the bathroom and prevent falls. This is his 2nd knee replacement. Pt. Reports learning a lot about modifying his home environment to increase independence.      Follow Up Recommendations  No OT follow up    Equipment Recommendations       Recommendations for Other Services       Precautions / Restrictions Precautions Precautions: Fall Restrictions Weight Bearing Restrictions: Yes RLE Weight Bearing: Weight bearing as tolerated      Mobility Bed Mobility Overal bed mobility: Needs Assistance Bed Mobility: Supine to Sit     Supine to sit: Supervision;HOB elevated     General bed mobility comments: Pt. useed a LE hooking method that he reports learning yesterday with PT.  Transfers       Sit to Stand: Supervision         General transfer comment: Pt. required SBA for transfers from the bed to the chair.    Balance     Sitting balance-Leahy Scale: Normal       Standing balance-Leahy Scale: Good Standing balance comment: With RW                            ADL Overall ADL's : Needs assistance/impaired                                       General ADL Comments: Pt. requires CGA to perform LE dressing with A/E use  at the EOB. Pt. requires CGA to stand and perform simulated clothing hiking, SBA for functional transfers. pt. is independent with self- feeding, and grooming tasks.     Vision     Perception     Praxis      Pertinent Vitals/Pain Pain Assessment: 0-10 Pain Score: 4  Pain Location: Right knee     Hand Dominance     Extremity/Trunk Assessment Upper Extremity Assessment Upper Extremity Assessment: Overall WFL for tasks assessed           Communication Communication Communication: No difficulties   Cognition Arousal/Alertness: Awake/alert Behavior During Therapy: WFL for tasks assessed/performed Overall Cognitive Status: Within Functional Limits for tasks assessed                     General Comments       Exercises       Shoulder Instructions      Home Living Family/patient expects to be discharged to:: Private residence Living Arrangements: Spouse/significant other Available Help at Discharge: Family;Available 24 hours/day Type of Home: House Home Access: Stairs to enter   Entrance Stairs-Rails: None Home Layout: One level  Home Equipment: [place] - 2 wheels          Prior Functioning/Environment Level of Independence: Independent        Comments: Patient has been independent prior to admission.     OT Diagnosis: Generalized weakness;Acute pain   OT Problem List: Impaired balance (sitting and/or standing);Decreased knowledge of use of DME or AE   OT Treatment/Interventions: Therapeutic exercise;Self-care/ADL training;Therapeutic activities;DME and/or AE instruction    OT Goals(Current goals can be found in the care plan section)    OT Frequency: Min 2X/week   Barriers to D/C:            Co-evaluation              End of Session Equipment Utilized During Treatment: Gait belt;Rolling walker  Activity Tolerance: Patient tolerated treatment well Patient left: in chair;with call bell/phone within reach;with  chair alarm set   Time: [id number] OT Time Calculation (min): 38 min Charges:  OT General Charges $OT Visit: 1 Procedure OT Evaluation $OT Eval Moderate Complexity: 1 Procedure OT Treatments $Self Care/Home Management : 8-22 mins G-Codes:    Harrel Carina, MS, OTR/L Harrel Carina 08/19/2015, 9:44 AM

## 2015-08-19 NOTE — Anesthesia Postprocedure Evaluation (Signed)
Anesthesia Post Note  Patient: David Mcgee  Procedure(s) Performed: Procedure(s) (LRB): COMPUTER ASSISTED TOTAL KNEE ARTHROPLASTY (Right)  Patient location during evaluation: Nursing Unit Anesthesia Type: Spinal Level of consciousness: awake and alert Pain management: satisfactory to patient Vital Signs Assessment: post-procedure vital signs reviewed and stable Respiratory status: spontaneous breathing Cardiovascular status: blood pressure returned to baseline Postop Assessment: no headache and no backache Anesthetic complications: no    Last Vitals:  Filed Vitals:   08/18/15 2030 08/19/15 0514  BP: 140/81 131/74  Pulse: 58 53  Temp: 36.5 C 36.6 C  Resp: 18     Last Pain:  Filed Vitals:   08/19/15 0749  PainSc: 4     LLE Motor Response: Purposeful movement (08/19/15 0740) LLE Sensation: Full sensation (08/19/15 0740) RLE Motor Response: Purposeful movement (08/19/15 0740) RLE Sensation: Full sensation (08/19/15 0740)      Brantley Fling

## 2015-08-19 NOTE — Progress Notes (Signed)
Physical Therapy Treatment Patient Details Name: David Mcgee MRN: [id number] DOB: [birth year] Today's Date: 08/19/2015    History of Present Illness Pt is a very pleasant 70 y/o male that presents for R TKR on 3/29.     PT Comments    Pt tolerated up in chair all day. Pt demonstrated carry over learning from education in morning session to afternoon session with transfers and ambulation. Progressing well. Continue PT for improved ambulation quality with increased R knee flexion on swing phase and continued strength and active range of motion to improve ease/ability of all functional mobility to allow for optimal, safe discharge home.   Follow Up Recommendations  Home health PT     Equipment Recommendations       Recommendations for Other Services       Precautions / Restrictions Precautions Precautions: Fall Restrictions Weight Bearing Restrictions: Yes RLE Weight Bearing: Weight bearing as tolerated    Mobility  Bed Mobility Overal bed mobility: Modified Independent Bed Mobility: Sit to Supine       Sit to supine: Supervision;HOB elevated   General bed mobility comments: use of hook method for self assist  Transfers Overall transfer level: Needs assistance Equipment used: Rolling walker (2 wheeled) Transfers: Sit to/from Stand Sit to Stand: Supervision         General transfer comment: Good use of RLE this session  Ambulation/Gait Ambulation/Gait assistance: Min guard;Supervision Ambulation Distance (Feet): 225 Feet Assistive device: Rolling walker (2 wheeled) Gait Pattern/deviations: Step-through pattern;Decreased weight shift to right Gait velocity: reduced Gait velocity interpretation: Below normal speed for age/gender General Gait Details: Good reciprocal pattern; encouraged R knee flexion with swing phase. Moderate lean on rw   Stairs Stairs: Yes Stairs assistance: Min guard Stair Management: One rail Right;Step to pattern;Sideways (BUE on  rail) Number of Stairs: 12 General stair comments: Educated on 2 hands on 1 rail with sidestepping pattern for increased support/stability currently. Pt understands proprer sequence well.   Wheelchair Mobility    Modified Rankin (Stroke Patients Only)       Balance           Standing balance support: Bilateral upper extremity supported Standing balance-Leahy Scale: Good                      Cognition Arousal/Alertness: Awake/alert Behavior During Therapy: WFL for tasks assessed/performed Overall Cognitive Status: Within Functional Limits for tasks assessed                      Exercises Total Joint Exercises Quad Sets: Strengthening;Both;20 reps;Supine Long Arc Quad: Strengthening;AAROM;Right;10 reps;Seated (AAROM end range) Knee Flexion: AAROM;Right;Seated;15 reps (4 position 10 sec hold each rep) Goniometric ROM: 1-90 (Initially -5 degree extension; improved with stretch/QS)    General Comments        Pertinent Vitals/Pain Pain Assessment: 0-10 Pain Score: 5  Pain Location: R knee Pain Descriptors / Indicators: Aching Pain Intervention(s): Monitored during session;Premedicated before session;Repositioned;Ice applied    Home Living                      Prior Function            PT Goals (current goals can now be found in the care plan section) Progress towards PT goals: Progressing toward goals    Frequency  BID    PT Plan Current plan remains appropriate    Co-evaluation  End of Session Equipment Utilized During Treatment: Gait belt Activity Tolerance: Patient tolerated treatment well Patient left: with call bell/phone within reach;with SCD's reapplied;with family/visitor present;in bed;with bed alarm set (polar care in place)     Time: 1351-1416 PT Time Calculation (min) (ACUTE ONLY): 25 min  Charges:  $Gait Training: 8-22 mins $Therapeutic Exercise: 8-22 mins                    G Codes:       Charlaine Dalton, PTA 08/19/2015, 2:42 PM

## 2015-08-19 NOTE — Progress Notes (Signed)
Physical Therapy Treatment Patient Details Name: David Mcgee MRN: [id number] DOB: March 19, 1946 Today's Date: 08/19/2015    History of Present Illness Pt is a very pleasant 70 y/o male that presents for R TKR on 3/29.     PT Comments    Pt progressing well with all mobility. Education provided for R knee extension/flexion stretching for technique, duration and frequency for carry over to home. Pt demonstrating good active assisted range of motion. Education also provided for increased and proper use of Right lower extremity for sit to/from stand transfers with improved technique post cueing. Improved quality of ambulation with education on technique for step length, rolling walker distance and in regard to body position and quad control with stance phase on right. Pt ambulates 280 ft this morning. Initiated stair climbing as well with education on sideways step to pattern with bilateral upper extremity support on 1 rail for increased support at this time. Pt understands and demonstrates well. Spouse present for most of session and provided education as well. Plan to see pt this afternoon for continued work on progressing range of motion, strength and all functional mobility to allow for optimal, safe transition home from hospital stay.   Follow Up Recommendations  Home health PT     Equipment Recommendations       Recommendations for Other Services       Precautions / Restrictions Precautions Precautions: Fall Restrictions Weight Bearing Restrictions: Yes RLE Weight Bearing: Weight bearing as tolerated    Mobility  Bed Mobility Overal bed mobility: Needs Assistance Bed Mobility: Supine to Sit     Supine to sit: Supervision;HOB elevated     General bed mobility comments: Not tested; up in chair  Transfers Overall transfer level: Needs assistance Equipment used: Rolling walker (2 wheeled) Transfers: Sit to/from Stand Sit to Stand: Supervision         General transfer  comment: STS re education to use RLE more; performed several times with improved use of RLE  Ambulation/Gait Ambulation/Gait assistance: Min guard Ambulation Distance (Feet): 280 Feet Assistive device: Rolling walker (2 wheeled) Gait Pattern/deviations: Step-to pattern;Step-through pattern (decreased R knee flexion with swing phase) Gait velocity: reduced Gait velocity interpretation: Below normal speed for age/gender General Gait Details: Initially step to with too large R step length/decreased L step. Improved with cueing/continued work to reciprocal fluid pattern without pause. Several episodes of R knee miild instability; improved with cue to QS with RLE stance phase to no mild buckling   Stairs Stairs: Yes Stairs assistance: Min guard Stair Management: One rail Right;Step to pattern;Sideways (BUE on rail) Number of Stairs: 12 General stair comments: Educated on 2 hands on 1 rail with sidestepping pattern for increased support/stability currently. Pt understands proprer sequence well.   Wheelchair Mobility    Modified Rankin (Stroke Patients Only)       Balance     Sitting balance-Leahy Scale: Normal     Standing balance support: Bilateral upper extremity supported Standing balance-Leahy Scale: Good Standing balance comment: With RW                    Cognition Arousal/Alertness: Awake/alert Behavior During Therapy: WFL for tasks assessed/performed Overall Cognitive Status: Within Functional Limits for tasks assessed                      Exercises Total Joint Exercises Quad Sets: Strengthening;Both;20 reps (long sit with double ankle towel roll. Stand x 10) Long Arc Quad: Strengthening;AAROM;Right;10 reps;Seated (AAROM  end range) Knee Flexion: AAROM;Right;Seated;15 reps (4 position 10 sec hold each rep) Goniometric ROM: 1-90 (Initially -5 degree extension; improved with stretch/QS)    General Comments        Pertinent Vitals/Pain Pain  Assessment: 0-10 Pain Score: 5  Pain Location: R knee Pain Descriptors / Indicators: Aching Pain Intervention(s): Monitored during session;Premedicated before session;Ice applied    Home Living Family/patient expects to be discharged to:: Private residence Living Arrangements: Spouse/significant other Available Help at Discharge: Family;Available 24 hours/day Type of Home: House Home Access: Stairs to enter Entrance Stairs-Rails: None Home Layout: One level Home Equipment: Walker - 2 wheels      Prior Function Level of Independence: Independent      Comments: Patient has been independent prior to admission.    PT Goals (current goals can now be found in the care plan section) Progress towards PT goals: Progressing toward goals    Frequency  BID    PT Plan Current plan remains appropriate    Co-evaluation             End of Session Equipment Utilized During Treatment: Gait belt Activity Tolerance: Patient tolerated treatment well Patient left: in chair;with call bell/phone within reach;with chair alarm set;with SCD's reapplied;with family/visitor present (polar care in place)     Time: [id number] PT Time Calculation (min) (ACUTE ONLY): 49 min  Charges:  $Gait Training: 23-37 mins $Therapeutic Exercise: 8-22 mins                    G Codes:      Charlaine Dalton, PTA 08/19/2015, 11:43 AM

## 2015-08-20 LAB — BASIC METABOLIC PANEL
Anion gap: 6 (ref 5–15)
BUN: 12 mg/dL (ref 6–20)
CHLORIDE: 102 mmol/L (ref 101–111)
CO2: 28 mmol/L (ref 22–32)
CREATININE: 1.24 mg/dL (ref 0.61–1.24)
Calcium: 8.3 mg/dL — ABNORMAL LOW (ref 8.9–10.3)
GFR calc non Af Amer: 57 mL/min — ABNORMAL LOW (ref >60)
Glucose, Bld: 146 mg/dL — ABNORMAL HIGH (ref 65–99)
POTASSIUM: 3.6 mmol/L (ref 3.5–5.1)
Sodium: 136 mmol/L (ref 135–145)

## 2015-08-20 LAB — CBC
HEMATOCRIT: 40.3 % (ref 40.0–52.0)
HEMOGLOBIN: 13.9 g/dL (ref 13.0–18.0)
MCH: 30.1 pg (ref 26.0–34.0)
MCHC: 34.5 g/dL (ref 32.0–36.0)
MCV: 87.2 fL (ref 80.0–100.0)
Platelets: 138 10*3/uL — ABNORMAL LOW (ref 150–440)
RBC: 4.63 MIL/uL (ref 4.40–5.90)
RDW: 13.8 % (ref 11.5–14.5)
WBC: 9.2 10*3/uL (ref 3.8–10.6)

## 2015-08-20 NOTE — Discharge Instructions (Signed)

## 2015-08-20 NOTE — Discharge Summary (Signed)
Physician Discharge Summary  Patient ID: David Mcgee MRN: [id number] DOB/AGE: 06/09/1945 70 y.o.  Admit date: 08/18/2015 Discharge date: 08/20/2015  Admission Diagnoses:  OSTEOARTHRITIS   Discharge Diagnoses: Patient Active Problem List   Diagnosis Date Noted  . 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  . Advance care planning 12/09/2014  . Elevated serum creatinine 12/09/2014  . Hyperglycemia 12/09/2014  . Atypical chest pain 02/23/2014  . PTSD (post-traumatic stress disorder) 01/24/2013  . Hyperlipidemia 11/16/2011  . Medicare annual wellness visit, subsequent 10/24/2011  . PALPITATIONS 06/10/2009  . LOW BACK PAIN, ACUTE 01/01/2009  . CARCINOMA, BASAL CELL 10/01/2008  . 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 HYPERTRIGLYCERIDEMIA 10/03/2007  . Essential hypertension 10/03/2007  . GERD 10/03/2007  . Knee pain 10/03/2007    Past Medical History  Diagnosis Date  . Palpitations   . GERD (gastroesophageal reflux disease)   . Insomnia   . PTSD (post-traumatic stress disorder)   . Spinal cord cysts   . Hypertension   . History of stress test     long time ago- wnl  . Depression     ? panic attacks, takes sertraline & also sees psych q 3 months   . H/O hiatal hernia   . Arthritis     back, knees, hands - OA  . PTSD (post-traumatic stress disorder)     post [place]  . 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 Cancer ([place])     Skin, not melanoma     Transfusion: Autovac transfusion given the first 6 hours postoperatively   Consultants (if any):   case management for home health assistance  Discharged Condition: Improved  Hospital Course: David Mcgee is an 70 y.o. male who was admitted 08/18/2015 with a diagnosis of degenerative arthrosis right knee and went to the operating room on 08/18/2015 and underwent the above named procedures.    Surgeries:Procedure(s): COMPUTER ASSISTED TOTAL KNEE ARTHROPLASTY on  08/18/2015  PRE-OPERATIVE DIAGNOSIS: Degenerative arthrosis of the right knee, primary  POST-OPERATIVE DIAGNOSIS: Same  PROCEDURE: Right total knee arthroplasty using computer-assisted navigation  SURGEON: Marciano Sequin. M.D.  ASSISTANT: Vance Peper, PA (present and scrubbed throughout the case, critical for assistance with exposure, retraction, instrumentation, and closure)  ANESTHESIA: spinal  ESTIMATED BLOOD LOSS: 100 mL  FLUIDS REPLACED: 1500 mL of crystalloid  TOURNIQUET TIME: 109 minutes  DRAINS: 2 medium drains to a reinfusion system  SOFT TISSUE RELEASES: Anterior cruciate ligament, posterior cruciate ligament, deep and superficial medial collateral ligament, patellofemoral ligament   IMPLANTS UTILIZED: DePuy PFC Sigma size 5 posterior stabilized femoral component (cemented), size 5 MBT tibial component (cemented), 41 mm 3 peg oval dome patella (cemented), and a 15 mm stabilized rotating platform polyethylene insert.  INDICATIONS FOR SURGERY: David Mcgee is a 70 y.o. year old male with a long history of progressive knee pain. X-rays demonstrated severe degenerative changes in tricompartmental fashion. The patient had not seen any significant improvement despite conservative nonsurgical intervention. After discussion of the risks and benefits of surgical intervention, the patient expressed understanding of the risks benefits and agree with plans for total knee arthroplasty.   The risks, benefits, and alternatives were discussed at length including but not limited to the risks of infection, bleeding, nerve injury, stiffness, blood clots, the need for revision surgery, cardiopulmonary complications, among others, and they were willing to proceed. Patient tolerated the surgery well. No complications .Patient was taken to  PACU where she was stabilized and then transferred to the orthopedic floor.  Patient started on Lovenox 30 q 12 hrs. Foot pumps applied bilaterally at 80 mm  hg. Heels elevated off bed with rolled towels. No evidence of DVT. Calves non tender. Negative Homan. Physical therapy started on day #1 for gait training and transfer with OT starting on  day #1 for ADL and assisted devices. Patient has done well with therapy. Ambulated greater than 280 feet on day 1 . Was able to go up and down 4 steps safely and independently.  Patient's IV and Foley were discontinued on day #1 with Hemovac being discontinued on day #2. Dressing also changed on day #2 prior to being discharged   He was given perioperative antibiotics:  Anti-infectives    Start     Dose/Rate Route Frequency Ordered Stop   08/18/15 1330  ceFAZolin (ANCEF) IVPB 2g/100 mL premix     2 g 200 mL/hr over 30 Minutes Intravenous Every 6 hours 08/18/15 1200 08/19/15 0807   08/18/15 0606  ceFAZolin (ANCEF) 2-4 GM/100ML-% IVPB    Comments:  KENNEDY, DENISE: cabinet override      08/18/15 0606 08/18/15 1814   08/18/15 0245  ceFAZolin (ANCEF) IVPB 2g/100 mL premix     2 g 200 mL/hr over 30 Minutes Intravenous  Once 08/18/15 0237 08/18/15 0738    .  He was fitted with AV 1 compression foot pump devices bilaterally, instructed on heel pumps, early ambulation, and fitted with TED stockings bilaterally for DVT prophylaxis.  He benefited maximally from the hospital stay and there were no complications.    Recent vital signs:  Filed Vitals:   08/19/15 1947 08/20/15 0429  BP: 136/74 132/72  Pulse: 72 69  Temp: 98.3 F (36.8 C) 98 F (36.7 C)  Resp:  18    Recent laboratory studies:  Lab Results  Component Value Date   HGB 13.9 08/20/2015   HGB 14.8 08/19/2015   HGB 16.8 08/05/2015   Lab Results  Component Value Date   WBC 9.2 08/20/2015   PLT 138* 08/20/2015   Lab Results  Component Value Date   INR 1.06 08/05/2015   Lab Results  Component Value Date   NA 136 08/20/2015   K 3.6 08/20/2015   CL 102 08/20/2015   CO2 28 08/20/2015   BUN 12 08/20/2015   CREATININE 1.24 08/20/2015    GLUCOSE 146* 08/20/2015    Discharge Medications:     Medication List    STOP taking these medications        aspirin 81 MG tablet      TAKE these medications        amLODipine 10 MG tablet  Commonly known as:  NORVASC  Take 1 tablet (10 mg total) by mouth at bedtime.     atorvastatin 10 MG tablet  Commonly known as:  LIPITOR  Take 1 tablet (10 mg total) by mouth at bedtime.     enoxaparin 40 MG/0.4ML injection  Commonly known as:  LOVENOX  Inject 0.4 mLs (40 mg total) into the skin daily.     Fish Oil 1200 MG Caps  Take 1 capsule by mouth 2 (two) times daily. 2 capsules at night and 1 capsule in am.     multivitamin tablet  Take 1 tablet by mouth daily.     omeprazole 20 MG capsule  Commonly known as:  PRILOSEC  Take 1 capsule (20 mg total) by mouth 2 (two) times daily.  oxyCODONE 5 MG immediate release tablet  Commonly known as:  Oxy IR/ROXICODONE  Take 1-2 tablets (5-10 mg total) by mouth every 4 (four) hours as needed for severe pain or breakthrough pain.     sertraline 50 MG tablet  Commonly known as:  ZOLOFT  Take 25 mg by mouth at bedtime. Pt takes as needed for mood.     tamsulosin 0.4 MG Caps capsule  Commonly known as:  FLOMAX  Take 0.4 mg by mouth every morning.     traMADol 50 MG tablet  Commonly known as:  ULTRAM  Take 1-2 tablets (50-100 mg total) by mouth every 4 (four) hours as needed for moderate pain.     traMADol 50 MG tablet  Commonly known as:  ULTRAM  Take 50 mg by mouth 2 (two) times daily. Ad needed for pain.        Diagnostic Studies: Dg Knee Right Port  08/18/2015  CLINICAL DATA:  Postop right total knee arthroplasty. EXAM: PORTABLE RIGHT KNEE - 1-2 VIEW COMPARISON:  None. FINDINGS: The patient has undergone interval right total knee arthroplasty. The hardware appears well positioned. Two surgical drains are in place. A small amount of air is present within the joint and soft tissues surrounding the knee. There is no evidence  of acute fracture or subluxation. IMPRESSION: No demonstrated complication related to right total knee arthroplasty. Electronically Signed   By: Richardean Sale M.D.   On: 08/18/2015 12:05    Disposition: 01-Home or Self Care      Discharge Instructions    Diet - low sodium heart healthy    Complete by:  As directed      Diet - low sodium heart healthy    Complete by:  As directed      Increase activity slowly    Complete by:  As directed      Increase activity slowly    Complete by:  As directed            Follow-up Information    Follow up with [facility] On 09/02/2015.   Specialty:  Physician Assistant   Why:  at 9:15am   Contact information:   [address] [facility] [place] [address] [phone]       Follow up with Dereck Leep, MD On 09/30/2015.   Specialty:  Orthopedic Surgery   Why:  at 9:15am   Contact information:   [place] [address] [phone]        Signed: Watt Climes. 08/20/2015, 7:15 AM

## 2015-08-20 NOTE — Progress Notes (Signed)
Patient discharged home. DC instructions provided and explained. Medications reviewed. Rx given. All questions answered. Dressing changed to right knee. Pt tolerated without discomfort.

## 2015-08-20 NOTE — Progress Notes (Signed)
   Subjective: 2 Days Post-Op Procedure(s) (LRB): COMPUTER ASSISTED TOTAL KNEE ARTHROPLASTY (Right) Patient reports pain as 5 on 0-10 scale.   Patient is well, and has had no acute complaints or problems Continue with physical therapy today.  Plan is to go Home after hospital stay. no nausea and no vomiting Patient denies any chest pains or shortness of breath. Objective: Vital signs in last 24 hours: Temp:  [97.9 F (36.6 C)-98.4 F (36.9 C)] 98 F (36.7 C) (03/31 0429) Pulse Rate:  [54-72] 69 (03/31 0429) Resp:  [17-18] 18 (03/31 0429) BP: (132-149)/(72-84) 132/72 mmHg (03/31 0429) SpO2:  [95 %-97 %] 96 % (03/31 0429) well approximated incision Heels are non tender and elevated off the bed using rolled towels Intake/Output from previous day: 03/30 0701 - 03/31 0700 In: 405 [P.O.:240; I.V.:165] Out: 1585 [Urine:1275; Drains:310] Intake/Output this shift:     Recent Labs  08/19/15 0557 08/20/15 0450  HGB 14.8 13.9    Recent Labs  08/19/15 0557 08/20/15 0450  WBC 8.9 9.2  RBC 4.86 4.63  HCT 43.0 40.3  PLT 138* 138*    Recent Labs  08/19/15 0557 08/20/15 0450  NA 134* 136  K 3.5 3.6  CL 102 102  CO2 27 28  BUN 12 12  CREATININE 1.16 1.24  GLUCOSE 125* 146*  CALCIUM 8.3* 8.3*   No results for input(s): LABPT, INR in the last 72 hours.  EXAM General - Patient is Alert, Appropriate and Oriented Extremity - Neurologically intact Neurovascular intact Sensation intact distally Intact pulses distally Dorsiflexion/Plantar flexion intact Dressing - dressing C/D/I Motor Function - intact, moving foot and toes well on exam. Having difficulty doing straight-leg raise on his own  Past Medical History  Diagnosis Date  . Palpitations   . GERD (gastroesophageal reflux disease)   . Insomnia   . PTSD (post-traumatic stress disorder)   . Spinal cord cysts   . Hypertension   . History of stress test     long time ago- wnl  . Depression     ? panic attacks,  takes sertraline & also sees psych q 3 months   . H/O hiatal hernia   . Arthritis     back, knees, hands - OA  . PTSD (post-traumatic stress disorder)     post [place]  . 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 Cancer ([facility])     Skin, not melanoma    Assessment/Plan: 2 Days Post-Op Procedure(s) (LRB): COMPUTER ASSISTED TOTAL KNEE ARTHROPLASTY (Right) Active Problems:   S/P total knee arthroplasty  Estimated body mass index is 28.09 kg/(m^2) as calculated from the following:   Height as of this encounter: 6' 2.5" (1.892 m).   Weight as of this encounter: 100.562 kg (221 lb 11.2 oz). Discharge home with home health  Labs: Were reviewed and within normal limits DVT Prophylaxis - Lovenox, Foot Pumps and TED hose Weight-Bearing as tolerated to right leg Please change dressing prior to being discharged today. Hemovac was discontinued on today's visit.  Jillyn Ledger. [place] 08/20/2015, 7:12 AM

## 2015-08-20 NOTE — Care Management (Addendum)
[facility] has been notified of patient discharge to home today. This RNCM will call [facility] for price of Lovenox when they open for the day (open at 0830). RNCM will continue to follow. Lovenox $85.00. Patient aware and agrees. No further RNCM needs. Case closed.

## 2015-08-22 DIAGNOSIS — E785 Hyperlipidemia, unspecified: Secondary | ICD-10-CM | POA: Diagnosis not present

## 2015-08-22 DIAGNOSIS — Z85828 Personal history of other malignant neoplasm of skin: Secondary | ICD-10-CM | POA: Diagnosis not present

## 2015-08-22 DIAGNOSIS — M19042 Primary osteoarthritis, left hand: Secondary | ICD-10-CM | POA: Diagnosis not present

## 2015-08-22 DIAGNOSIS — N189 Chronic kidney disease, unspecified: Secondary | ICD-10-CM | POA: Diagnosis not present

## 2015-08-22 DIAGNOSIS — Z96653 Presence of artificial knee joint, bilateral: Secondary | ICD-10-CM | POA: Diagnosis not present

## 2015-08-22 DIAGNOSIS — M19041 Primary osteoarthritis, right hand: Secondary | ICD-10-CM | POA: Diagnosis not present

## 2015-08-22 DIAGNOSIS — Z471 Aftercare following joint replacement surgery: Secondary | ICD-10-CM | POA: Diagnosis not present

## 2015-08-22 DIAGNOSIS — I129 Hypertensive chronic kidney disease with stage 1 through stage 4 chronic kidney disease, or unspecified chronic kidney disease: Secondary | ICD-10-CM | POA: Diagnosis not present

## 2015-08-22 DIAGNOSIS — Z79891 Long term (current) use of opiate analgesic: Secondary | ICD-10-CM | POA: Diagnosis not present

## 2015-08-22 DIAGNOSIS — F4310 Post-traumatic stress disorder, unspecified: Secondary | ICD-10-CM | POA: Diagnosis not present

## 2015-08-22 DIAGNOSIS — Z7901 Long term (current) use of anticoagulants: Secondary | ICD-10-CM | POA: Diagnosis not present

## 2015-08-22 DIAGNOSIS — M479 Spondylosis, unspecified: Secondary | ICD-10-CM | POA: Diagnosis not present

## 2015-08-30 DIAGNOSIS — M19042 Primary osteoarthritis, left hand: Secondary | ICD-10-CM | POA: Diagnosis not present

## 2015-08-30 DIAGNOSIS — Z96653 Presence of artificial knee joint, bilateral: Secondary | ICD-10-CM | POA: Diagnosis not present

## 2015-08-30 DIAGNOSIS — Z85828 Personal history of other malignant neoplasm of skin: Secondary | ICD-10-CM | POA: Diagnosis not present

## 2015-08-30 DIAGNOSIS — Z471 Aftercare following joint replacement surgery: Secondary | ICD-10-CM | POA: Diagnosis not present

## 2015-08-30 DIAGNOSIS — I129 Hypertensive chronic kidney disease with stage 1 through stage 4 chronic kidney disease, or unspecified chronic kidney disease: Secondary | ICD-10-CM | POA: Diagnosis not present

## 2015-08-30 DIAGNOSIS — N189 Chronic kidney disease, unspecified: Secondary | ICD-10-CM | POA: Diagnosis not present

## 2015-08-30 DIAGNOSIS — Z79891 Long term (current) use of opiate analgesic: Secondary | ICD-10-CM | POA: Diagnosis not present

## 2015-08-30 DIAGNOSIS — M19041 Primary osteoarthritis, right hand: Secondary | ICD-10-CM | POA: Diagnosis not present

## 2015-08-30 DIAGNOSIS — M479 Spondylosis, unspecified: Secondary | ICD-10-CM | POA: Diagnosis not present

## 2015-08-30 DIAGNOSIS — F4310 Post-traumatic stress disorder, unspecified: Secondary | ICD-10-CM | POA: Diagnosis not present

## 2015-08-30 DIAGNOSIS — Z7901 Long term (current) use of anticoagulants: Secondary | ICD-10-CM | POA: Diagnosis not present

## 2015-08-30 DIAGNOSIS — E785 Hyperlipidemia, unspecified: Secondary | ICD-10-CM | POA: Diagnosis not present

## 2015-09-02 DIAGNOSIS — Z96651 Presence of right artificial knee joint: Secondary | ICD-10-CM | POA: Diagnosis not present

## 2015-09-07 DIAGNOSIS — Z96651 Presence of right artificial knee joint: Secondary | ICD-10-CM | POA: Diagnosis not present

## 2015-09-09 DIAGNOSIS — Z96651 Presence of right artificial knee joint: Secondary | ICD-10-CM | POA: Diagnosis not present

## 2015-09-13 DIAGNOSIS — Z96651 Presence of right artificial knee joint: Secondary | ICD-10-CM | POA: Diagnosis not present

## 2015-09-15 DIAGNOSIS — Z96651 Presence of right artificial knee joint: Secondary | ICD-10-CM | POA: Diagnosis not present

## 2015-09-20 DIAGNOSIS — Z96651 Presence of right artificial knee joint: Secondary | ICD-10-CM | POA: Diagnosis not present

## 2015-09-23 DIAGNOSIS — Z96651 Presence of right artificial knee joint: Secondary | ICD-10-CM | POA: Diagnosis not present

## 2015-09-27 DIAGNOSIS — Z96651 Presence of right artificial knee joint: Secondary | ICD-10-CM | POA: Diagnosis not present

## 2015-09-30 DIAGNOSIS — Z96651 Presence of right artificial knee joint: Secondary | ICD-10-CM | POA: Diagnosis not present

## 2015-11-08 DIAGNOSIS — Z471 Aftercare following joint replacement surgery: Secondary | ICD-10-CM | POA: Diagnosis not present

## 2015-11-16 DIAGNOSIS — Z08 Encounter for follow-up examination after completed treatment for malignant neoplasm: Secondary | ICD-10-CM | POA: Diagnosis not present

## 2015-11-16 DIAGNOSIS — Z85828 Personal history of other malignant neoplasm of skin: Secondary | ICD-10-CM | POA: Diagnosis not present

## 2015-11-16 DIAGNOSIS — L570 Actinic keratosis: Secondary | ICD-10-CM | POA: Diagnosis not present

## 2015-11-16 DIAGNOSIS — Z872 Personal history of diseases of the skin and subcutaneous tissue: Secondary | ICD-10-CM | POA: Diagnosis not present

## 2015-11-16 DIAGNOSIS — D485 Neoplasm of uncertain behavior of skin: Secondary | ICD-10-CM | POA: Diagnosis not present

## 2015-11-16 DIAGNOSIS — C44619 Basal cell carcinoma of skin of left upper limb, including shoulder: Secondary | ICD-10-CM | POA: Diagnosis not present

## 2015-11-16 DIAGNOSIS — Z1283 Encounter for screening for malignant neoplasm of skin: Secondary | ICD-10-CM | POA: Diagnosis not present

## 2015-11-16 DIAGNOSIS — L821 Other seborrheic keratosis: Secondary | ICD-10-CM | POA: Diagnosis not present

## 2015-11-28 ENCOUNTER — Other Ambulatory Visit: Payer: Self-pay | Admitting: Family Medicine

## 2015-11-28 DIAGNOSIS — I10 Essential (primary) hypertension: Secondary | ICD-10-CM

## 2015-12-01 ENCOUNTER — Other Ambulatory Visit (INDEPENDENT_AMBULATORY_CARE_PROVIDER_SITE_OTHER): Payer: PPO

## 2015-12-01 ENCOUNTER — Ambulatory Visit (INDEPENDENT_AMBULATORY_CARE_PROVIDER_SITE_OTHER): Payer: PPO

## 2015-12-01 VITALS — BP 132/80 | HR 58 | Temp 97.7°F | Ht 73.5 in | Wt 223.8 lb

## 2015-12-01 DIAGNOSIS — Z0000 Encounter for general adult medical examination without abnormal findings: Secondary | ICD-10-CM | POA: Diagnosis not present

## 2015-12-01 DIAGNOSIS — I10 Essential (primary) hypertension: Secondary | ICD-10-CM

## 2015-12-01 DIAGNOSIS — Z1159 Encounter for screening for other viral diseases: Secondary | ICD-10-CM

## 2015-12-01 LAB — COMPREHENSIVE METABOLIC PANEL
ALT: 15 U/L (ref 0–53)
AST: 16 U/L (ref 0–37)
Albumin: 4.4 g/dL (ref 3.5–5.2)
Alkaline Phosphatase: 49 U/L (ref 39–117)
BUN: 15 mg/dL (ref 6–23)
CHLORIDE: 105 meq/L (ref 96–112)
CO2: 29 meq/L (ref 19–32)
Calcium: 9.6 mg/dL (ref 8.4–10.5)
Creatinine, Ser: 1.53 mg/dL — ABNORMAL HIGH (ref 0.40–1.50)
GFR: 47.89 mL/min — ABNORMAL LOW (ref >60.00)
GLUCOSE: 110 mg/dL — AB (ref 70–99)
POTASSIUM: 4.5 meq/L (ref 3.5–5.1)
SODIUM: 139 meq/L (ref 135–145)
Total Bilirubin: 0.6 mg/dL (ref 0.2–1.2)
Total Protein: 7.4 g/dL (ref 6.0–8.3)

## 2015-12-01 LAB — LIPID PANEL
CHOL/HDL RATIO: 3
CHOLESTEROL: 115 mg/dL (ref 0–200)
HDL: 36.1 mg/dL — ABNORMAL LOW (ref >39.00)
LDL CALC: 64 mg/dL (ref 0–99)
NONHDL: 78.77
Triglycerides: 76 mg/dL (ref 0.0–149.0)
VLDL: 15.2 mg/dL (ref 0.0–40.0)

## 2015-12-01 NOTE — Progress Notes (Signed)
Pre visit review using our clinic review tool, if applicable. No additional management support is needed unless otherwise documented below in the visit note. 

## 2015-12-01 NOTE — Progress Notes (Signed)
   Subjective:    Patient ID: David Mcgee, male    DOB: [birth year], 70 y.o.   MRN: [id number]  HPI I reviewed health advisor's note, was available for consultation, and agree with documentation and plan.    Review of Systems     Objective:   Physical Exam        Assessment & Plan:

## 2015-12-01 NOTE — Progress Notes (Signed)
Subjective:   David Mcgee is a 70 y.o. male who presents for Medicare Annual/Subsequent preventive examination.  Review of Systems:  N/A Cardiac Risk Factors include: advanced age (>77men, >48 women);male gender;hypertension     Objective:    Vitals: BP 132/80 mmHg  Pulse 58  Temp(Src) 97.7 F (36.5 C) (Oral)  Ht 6' 1.5" (1.867 m)  Wt 223 lb 12 oz (101.492 kg)  BMI 29.12 kg/m2  SpO2 96%  Body mass index is 29.12 kg/(m^2).  Tobacco History  Smoking status  . Former Smoker -- 1.50 packs/day for 9 years  . Types: Cigarettes  . Quit date: 05/22/1969  Smokeless tobacco  . Never Used    Comment: quit in 1972     Counseling given: No   Past Medical History  Diagnosis Date  . Palpitations   . GERD (gastroesophageal reflux disease)   . Insomnia   . PTSD (post-traumatic stress disorder)   . Spinal cord cysts   . Hypertension   . History of stress test     long time ago- wnl  . Depression     ? panic attacks, takes sertraline & also sees psych q 3 months   . H/O hiatal hernia   . Arthritis     back, knees, hands - OA  . PTSD (post-traumatic stress disorder)     post [place]  . 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 Cancer ([place])     Skin, not melanoma   Past Surgical History  Procedure Laterality Date  . Neuroma surgery      Right foot Morton's neuroma excision  . Knee arthroscopy  10/2000    Right Knee (Dr. Sabra Heck)  . Hernoiorraphy      Right; years ago  . Esophagogastroduodenoscopy  06/1995    Dilitation bx negative (Dr. Epifanio Lesches)  . Total knee arthroplasty    . Tonsillectomy    . Lumbar laminectomy/decompression microdiscectomy Right 12/09/2012    Procedure: LUMBAR LAMINECTOMY/DECOMPRESSION MICRODISCECTOMY 1 LEVEL;  Surgeon: Elaina Hoops, MD;  Location: [facility];  Service: Neurosurgery;  Laterality: Right;  LUMBAR LAMINECTOMY/DECOMPRESSION MICRODISCECTOMY 1 LEVEL  . Joint replacement  05/2012    L  knee  . Knee arthroplasty Right 08/18/2015    Procedure: COMPUTER ASSISTED TOTAL KNEE ARTHROPLASTY;  Surgeon: Dereck Leep, MD;  Location: [facility];  Service: Orthopedics;  Laterality: Right;   Family History  Problem Relation Age of Onset  . Hypertension Mother   . Diabetes Mother   . Kidney failure Father   . Hypertension Father   . Obesity Sister   . Diabetes Sister   . Diabetes Brother   . Hypertension Brother   . Hyperlipidemia Brother   . Stroke Brother   . Depression Brother     ?  . Colon cancer Neg Hx   . Prostate cancer Neg Hx    History  Sexual Activity  . Sexual Activity: Yes    Outpatient Encounter Prescriptions as of 12/01/2015  Medication Sig  . amLODipine (NORVASC) 10 MG tablet Take 1 tablet (10 mg total) by mouth at bedtime.  Marland Kitchen aspirin 81 MG tablet Take 81 mg by mouth daily.  Marland Kitchen atorvastatin (LIPITOR) 10 MG tablet Take 10 mg by mouth daily.  Marland Kitchen lisinopril (PRINIVIL,ZESTRIL) 10 MG tablet Take 5 mg by mouth daily.  . Multiple Vitamin (MULTIVITAMIN) tablet Take 1 tablet by mouth daily.  . Omega-3 Fatty  Acids (FISH OIL) 1200 MG CAPS Take 1 capsule by mouth 2 (two) times daily. 2 capsules at night and 1 capsule in am.  . omeprazole (PRILOSEC) 20 MG capsule Take 1 capsule (20 mg total) by mouth 2 (two) times daily.  . sertraline (ZOLOFT) 50 MG tablet Take 25 mg by mouth at bedtime. Pt takes as needed for mood.  . tamsulosin (FLOMAX) 0.4 MG CAPS capsule Take 0.4 mg by mouth every morning.  . traZODone (DESYREL) 50 MG tablet Take 50 mg by mouth at bedtime as needed for sleep.  . [DISCONTINUED] lisinopril (PRINIVIL,ZESTRIL) 2.5 MG tablet Take 2.5 mg by mouth daily.  . traMADol (ULTRAM) 50 MG tablet Take 1-2 tablets (50-100 mg total) by mouth every 4 (four) hours as needed for moderate pain. (Patient not taking: Reported on 12/01/2015)  . [DISCONTINUED] atorvastatin (LIPITOR) 10 MG tablet Take 1 tablet (10 mg total) by mouth at bedtime.  . [DISCONTINUED] enoxaparin  (LOVENOX) 40 MG/0.4ML injection Inject 0.4 mLs (40 mg total) into the skin daily.  . [DISCONTINUED] oxyCODONE (OXY IR/ROXICODONE) 5 MG immediate release tablet Take 1-2 tablets (5-10 mg total) by mouth every 4 (four) hours as needed for severe pain or breakthrough pain.  . [DISCONTINUED] traMADol (ULTRAM) 50 MG tablet Take 50 mg by mouth 2 (two) times daily. Ad needed for pain.   No facility-administered encounter medications on file as of 12/01/2015.    Activities of Daily Living In your present state of health, do you have any difficulty performing the following activities: 12/01/2015 08/18/2015  Hearing? [place]? N -  Difficulty concentrating or making decisions? N -  Walking or climbing stairs? N -  Dressing or bathing? N -  Doing errands, shopping? N N  Preparing Food and eating ? N -  Using the Toilet? N -  In the past six months, have you accidently leaked urine? N -  Do you have problems with loss of bowel control? N -  Managing your Medications? N -  Managing your Finances? N -  Housekeeping or managing your Housekeeping? N -    Patient Care Team: Tonia Ghent, MD as PCP - General (Family Medicine)   Assessment:     Hearing Screening Comments: Wears bilateral hearing aids Vision Screening Comments: Last vision exam at [facility] approx. 4 months ago  Exercise Activities and Dietary recommendations Current Exercise Habits: Home exercise routine, Type of exercise: stretching, Time (Minutes): 30, Frequency (Times/Week): 3, Weekly Exercise (Minutes/Week): 90, Intensity: Mild, Exercise limited by: None identified  Goals    . Increase physical activity     Starting 12/01/2015, I will continue to exercise for at least 30 min 3 days per week.       Fall Risk Fall Risk  12/01/2015 12/07/2014 01/23/2013  Falls in the past year? No No No   Depression Screen PHQ 2/9 Scores 12/01/2015 12/07/2014 01/23/2013  PHQ - 2 Score 0 0 2    Cognitive Testing MMSE - Mini Mental State Exam  12/01/2015  Orientation to time 5  Orientation to Place 5  Registration 3  Attention/ Calculation 0  Recall 3  Language- name 2 objects 0  Language- repeat 1  Language- follow 3 step command 3  Language- read & follow direction 0  Write a sentence 0  Copy design 0  Total score 20   PLEASE NOTE: A Mini-Cog screen was completed. Maximum score is 20. A value of 0 denotes this part of [place] MMSE was not completed or  the patient failed this part of the Mini-Cog screening.   Mini-Cog Screening Orientation to Time - Max 5 pts Orientation to Place - Max 5 pts Registration - Max 3 pts Recall - Max 3 pts Language Repeat - Max 1 pts Language Follow 3 Step Command - Max 3 pts  Immunization History  Administered Date(s) Administered  . Influenza, High Dose Seasonal PF 02/13/2014  . Pneumococcal Polysaccharide-23 09/22/2010  . Td 10/01/2008  . Zoster 05/22/2013   Screening Tests Health Maintenance  Topic Date Due  . COLON CANCER SCREENING ANNUAL FOBT  11/30/2028 (Originally 11/07/2012)  . INFLUENZA VACCINE  12/21/2015  . TETANUS/TDAP  10/02/2018  . COLONOSCOPY  04/20/2025  . ZOSTAVAX  Completed  . Hepatitis C Screening  Completed  . PNA vac Low Risk Adult  Addressed      Plan:     I have personally reviewed and addressed the Medicare Annual Wellness questionnaire and have noted the following in the patient's chart:  A. Medical and social history B. Use of alcohol, tobacco or illicit drugs  C. Current medications and supplements D. Functional ability and status E.  Nutritional status F.  Physical activity G. Advance directives H. List of other physicians I.  Hospitalizations, surgeries, and ER visits in previous 12 months J.  [place] to include hearing, vision, cognitive, depression L. Referrals and appointments - none  In addition, I have reviewed and discussed with patient certain preventive protocols, quality metrics, and best practice recommendations. A  written personalized care plan for preventive services as well as general preventive health recommendations were provided to patient.  See attached scanned questionnaire for additional information.   Signed,   Lindell Noe, MHA, BS, LPN Health Advisor

## 2015-12-01 NOTE — Patient Instructions (Addendum)
[name] , Thank you for taking time to come for your Medicare Wellness Visit. I appreciate your ongoing commitment to your health goals. Please review the following plan we discussed and let me know if I can assist you in the future.   These are the goals we discussed: Goals    . Increase physical activity     Starting 12/01/2015, I will continue to exercise for at least 30 min 3 days per week.        This is a list of the screening recommended for you and due dates:  Health Maintenance  Topic Date Due  . Stool Blood Test  11/30/2028*  . Flu Shot  12/21/2015  . Tetanus Vaccine  10/02/2018  . Colon Cancer Screening  04/20/2025  . Shingles Vaccine  Completed  .  Hepatitis C: One time screening is recommended by Center for Disease Control  (CDC) for  adults born from [birth year] through 1966.   Completed  . Pneumonia vaccines  Addressed  *Topic was postponed. The date shown is not the original due date.    Preventive Care for Adults  A healthy lifestyle and preventive care can promote health and wellness. Preventive health guidelines for adults include the following key practices.  . A routine yearly physical is a good way to check with your health care provider about your health and preventive screening. It is a chance to share any concerns and updates on your health and to receive a thorough exam.  . Visit your dentist for a routine exam and preventive care every 6 months. Brush your teeth twice a day and floss once a day. Good oral hygiene prevents tooth decay and gum disease.  . The frequency of eye exams is based on your age, health, family medical history, use  of contact lenses, and other factors. Follow your health care provider's ecommendations for frequency of eye exams.  . Eat a healthy diet. Foods like vegetables, fruits, whole grains, low-fat dairy products, and lean protein foods contain the nutrients you need without too many calories. Decrease your intake of foods high in  solid fats, added sugars, and salt. Eat the right amount of calories for you. Get information about a proper diet from your health care provider, if necessary.  . Regular physical exercise is one of the most important things you can do for your health. Most adults should get at least 150 minutes of moderate-intensity exercise (any activity that increases your heart rate and causes you to sweat) each week. In addition, most adults need muscle-strengthening exercises on 2 or more days a week.  Silver Sneakers may be a benefit available to you. To determine eligibility, you may visit the website: www.silversneakers.com or contact program at [phone] Mon-Fri between 8AM-8PM.   . Maintain a healthy weight. The body mass index (BMI) is a screening tool to identify possible weight problems. It provides an estimate of body fat based on height and weight. Your health care provider can find your BMI and can help you achieve or maintain a healthy weight.   For adults 20 years and older: ? A BMI below 18.5 is considered underweight. ? A BMI of 18.5 to 24.9 is normal. ? A BMI of 25 to 29.9 is considered overweight. ? A BMI of 30 and above is considered obese.   . Maintain normal blood lipids and cholesterol levels by exercising and minimizing your intake of saturated fat. Eat a balanced diet with plenty of fruit and vegetables. Blood tests  for lipids and cholesterol should begin at age 35 and be repeated every 5 years. If your lipid or cholesterol levels are high, you are over 50, or you are at high risk for heart disease, you may need your cholesterol levels checked more frequently. Ongoing high lipid and cholesterol levels should be treated with medicines if diet and exercise are not working.  . If you smoke, find out from your health care provider how to quit. If you do not use tobacco, please do not start.  . If you choose to drink alcohol, please do not consume more than 2 drinks per day. One drink  is considered to be 12 ounces (355 mL) of beer, 5 ounces (148 mL) of wine, or 1.5 ounces (44 mL) of liquor.  . If you are 82-55 years old, ask your health care provider if you should take aspirin to prevent strokes.  . Use sunscreen. Apply sunscreen liberally and repeatedly throughout the day. You should seek shade when your shadow is shorter than you. Protect yourself by wearing long sleeves, pants, a wide-brimmed hat, and sunglasses year round, whenever you are outdoors.  . Once a month, do a whole body skin exam, using a mirror to look at the skin on your back. Tell your health care provider of new moles, moles that have irregular borders, moles that are larger than a pencil eraser, or moles that have changed in shape or color.

## 2015-12-01 NOTE — Progress Notes (Signed)
PCP notes:  Health maintenance:   Hep C screening - completed  Abnormal screenings: None  Patient concerns: None  Nurse concerns: None  Next PCP appt: 12/09/2015 @ 0945

## 2015-12-02 LAB — HEPATITIS C ANTIBODY: HCV AB: NEGATIVE

## 2015-12-03 ENCOUNTER — Other Ambulatory Visit: Payer: PPO

## 2015-12-03 ENCOUNTER — Ambulatory Visit: Payer: PPO

## 2015-12-08 DIAGNOSIS — C44519 Basal cell carcinoma of skin of other part of trunk: Secondary | ICD-10-CM | POA: Diagnosis not present

## 2015-12-08 DIAGNOSIS — C44619 Basal cell carcinoma of skin of left upper limb, including shoulder: Secondary | ICD-10-CM | POA: Diagnosis not present

## 2015-12-09 ENCOUNTER — Ambulatory Visit (INDEPENDENT_AMBULATORY_CARE_PROVIDER_SITE_OTHER): Payer: PPO | Admitting: Family Medicine

## 2015-12-09 ENCOUNTER — Encounter: Payer: Self-pay | Admitting: Family Medicine

## 2015-12-09 VITALS — BP 108/68 | HR 58 | Temp 98.4°F | Wt 227.8 lb

## 2015-12-09 DIAGNOSIS — R748 Abnormal levels of other serum enzymes: Secondary | ICD-10-CM

## 2015-12-09 DIAGNOSIS — I10 Essential (primary) hypertension: Secondary | ICD-10-CM | POA: Diagnosis not present

## 2015-12-09 DIAGNOSIS — E785 Hyperlipidemia, unspecified: Secondary | ICD-10-CM | POA: Diagnosis not present

## 2015-12-09 DIAGNOSIS — R7989 Other specified abnormal findings of blood chemistry: Secondary | ICD-10-CM

## 2015-12-09 MED ORDER — SERTRALINE HCL 50 MG PO TABS: 25.0000 mg | ORAL_TABLET | Freq: Every day | ORAL | Status: DC

## 2015-12-09 MED ORDER — LISINOPRIL 10 MG PO TABS: ORAL_TABLET | ORAL | Status: DC

## 2015-12-09 MED ORDER — TRAZODONE HCL 50 MG PO TABS: 25.0000 mg | ORAL_TABLET | Freq: Every evening | ORAL | Status: DC | PRN

## 2015-12-09 NOTE — Patient Instructions (Addendum)
Please ask for a hard copy of your labs from the [place].  See if you can get me a copy.   Cut the lisinopril back to 2.5mg  a day.  Tell them you were a little lightheaded on the 5mg  dose and we recheck your labs just before the [place] visit.   Take care.  Glad to see you.  Lab visit on 12/15/15.  We'll go from there.

## 2015-12-09 NOTE — Progress Notes (Signed)
Hypertension:  Using medication without problems or lightheadedness: yes except for rarely lightheaded Chest pain with exertion:no Edema:no Short of breath:no Elevated Cr d/w pt.  No nsaid use.    Elevated Cholesterol: Using medications without problems: yes Muscle aches: no Diet compliance: encouraged.   Exercise:encouraged more Labs d/w pt.    Wife designated if patient were incapacitated.   [facility].    PSA was likely done per [place].  D/w pt.  See AVS.    Meds, vitals, and allergies reviewed.   PMH and SH reviewed  ROS: Per HPI unless specifically indicated in ROS section   GEN: nad, alert and oriented HEENT: mucous membranes moist NECK: supple w/o LA CV: rrr. PULM: ctab, no inc wob ABD: soft, +bs EXT: no edema SKIN: no acute rash

## 2015-12-10 NOTE — Assessment & Plan Note (Signed)
Continue statin, labs d/w pt.  He agrees.

## 2015-12-10 NOTE — Assessment & Plan Note (Signed)
Cut the lisinopril back to 2.5mg  a day.  F/u labs pending for next week.

## 2015-12-10 NOTE — Assessment & Plan Note (Signed)
He'll ask for a hard copy of his labs from the [place] to try to get me a copy.   Cut the lisinopril back to 2.5mg  a day.  Lab visit on 12/15/15.  We'll go from there.   Labs from [facility] d/w pt.   Routine renal cautions given, re: neprotoxins.  No nsaid use.   >25 minutes spent in face to face time with patient, >50% spent in counselling or coordination of care.

## 2015-12-15 ENCOUNTER — Other Ambulatory Visit (INDEPENDENT_AMBULATORY_CARE_PROVIDER_SITE_OTHER): Payer: PPO

## 2015-12-15 DIAGNOSIS — I10 Essential (primary) hypertension: Secondary | ICD-10-CM | POA: Diagnosis not present

## 2015-12-15 LAB — BASIC METABOLIC PANEL
BUN: 13 mg/dL (ref 6–23)
CALCIUM: 9.3 mg/dL (ref 8.4–10.5)
CO2: 28 meq/L (ref 19–32)
CREATININE: 1.5 mg/dL (ref 0.40–1.50)
Chloride: 104 mEq/L (ref 96–112)
GFR: 48.99 mL/min — ABNORMAL LOW (ref >60.00)
GLUCOSE: 103 mg/dL — AB (ref 70–99)
Potassium: 4.3 mEq/L (ref 3.5–5.1)
Sodium: 138 mEq/L (ref 135–145)

## 2016-03-07 ENCOUNTER — Telehealth: Payer: Self-pay | Admitting: Cardiovascular Disease

## 2016-03-07 NOTE — Telephone Encounter (Signed)
3 attempts to schedule fu from recall.  Deleting recall.  °

## 2016-06-06 DIAGNOSIS — Z85828 Personal history of other malignant neoplasm of skin: Secondary | ICD-10-CM | POA: Diagnosis not present

## 2016-06-06 DIAGNOSIS — D485 Neoplasm of uncertain behavior of skin: Secondary | ICD-10-CM | POA: Diagnosis not present

## 2016-06-06 DIAGNOSIS — L570 Actinic keratosis: Secondary | ICD-10-CM | POA: Diagnosis not present

## 2016-06-06 DIAGNOSIS — D229 Melanocytic nevi, unspecified: Secondary | ICD-10-CM | POA: Diagnosis not present

## 2016-06-06 DIAGNOSIS — C44612 Basal cell carcinoma of skin of right upper limb, including shoulder: Secondary | ICD-10-CM | POA: Diagnosis not present

## 2016-07-17 DIAGNOSIS — C44619 Basal cell carcinoma of skin of left upper limb, including shoulder: Secondary | ICD-10-CM | POA: Diagnosis not present

## 2016-07-17 DIAGNOSIS — C4491 Basal cell carcinoma of skin, unspecified: Secondary | ICD-10-CM | POA: Diagnosis not present

## 2016-08-17 DIAGNOSIS — Z96652 Presence of left artificial knee joint: Secondary | ICD-10-CM | POA: Diagnosis not present

## 2016-08-17 DIAGNOSIS — Z96651 Presence of right artificial knee joint: Secondary | ICD-10-CM | POA: Diagnosis not present

## 2016-10-24 DIAGNOSIS — D485 Neoplasm of uncertain behavior of skin: Secondary | ICD-10-CM | POA: Diagnosis not present

## 2016-10-24 DIAGNOSIS — D170 Benign lipomatous neoplasm of skin and subcutaneous tissue of head, face and neck: Secondary | ICD-10-CM | POA: Diagnosis not present

## 2016-10-24 DIAGNOSIS — C44311 Basal cell carcinoma of skin of nose: Secondary | ICD-10-CM | POA: Diagnosis not present

## 2017-01-15 DIAGNOSIS — Z86018 Personal history of other benign neoplasm: Secondary | ICD-10-CM | POA: Diagnosis not present

## 2017-01-15 DIAGNOSIS — Z85828 Personal history of other malignant neoplasm of skin: Secondary | ICD-10-CM | POA: Diagnosis not present

## 2017-01-15 DIAGNOSIS — L570 Actinic keratosis: Secondary | ICD-10-CM | POA: Diagnosis not present

## 2017-01-15 DIAGNOSIS — Z872 Personal history of diseases of the skin and subcutaneous tissue: Secondary | ICD-10-CM | POA: Diagnosis not present

## 2017-01-15 DIAGNOSIS — G548 Other nerve root and plexus disorders: Secondary | ICD-10-CM | POA: Diagnosis not present

## 2017-01-26 DIAGNOSIS — C44311 Basal cell carcinoma of skin of nose: Secondary | ICD-10-CM | POA: Diagnosis not present

## 2017-01-26 HISTORY — PX: MOHS SURGERY: SUR867

## 2017-03-21 ENCOUNTER — Ambulatory Visit (INDEPENDENT_AMBULATORY_CARE_PROVIDER_SITE_OTHER): Payer: PPO

## 2017-03-21 ENCOUNTER — Other Ambulatory Visit: Payer: Self-pay | Admitting: Family Medicine

## 2017-03-21 VITALS — BP 116/80 | HR 56 | Temp 97.6°F | Ht 73.5 in | Wt 226.0 lb

## 2017-03-21 DIAGNOSIS — I10 Essential (primary) hypertension: Secondary | ICD-10-CM | POA: Diagnosis not present

## 2017-03-21 DIAGNOSIS — Z0000 Encounter for general adult medical examination without abnormal findings: Secondary | ICD-10-CM

## 2017-03-21 LAB — COMPREHENSIVE METABOLIC PANEL
ALT: 21 U/L (ref 0–53)
AST: 23 U/L (ref 0–37)
Albumin: 4.4 g/dL (ref 3.5–5.2)
Alkaline Phosphatase: 39 U/L (ref 39–117)
BUN: 17 mg/dL (ref 6–23)
CHLORIDE: 103 meq/L (ref 96–112)
CO2: 30 meq/L (ref 19–32)
CREATININE: 1.29 mg/dL (ref 0.40–1.50)
Calcium: 9.6 mg/dL (ref 8.4–10.5)
GFR: 58.1 mL/min — ABNORMAL LOW (ref >60.00)
Glucose, Bld: 90 mg/dL (ref 70–99)
Potassium: 4.2 mEq/L (ref 3.5–5.1)
SODIUM: 139 meq/L (ref 135–145)
Total Bilirubin: 1.1 mg/dL (ref 0.2–1.2)
Total Protein: 7.5 g/dL (ref 6.0–8.3)

## 2017-03-21 LAB — LIPID PANEL
CHOL/HDL RATIO: 3
Cholesterol: 119 mg/dL (ref 0–200)
HDL: 39 mg/dL — ABNORMAL LOW (ref >39.00)
LDL CALC: 59 mg/dL (ref 0–99)
NONHDL: 79.96
Triglycerides: 105 mg/dL (ref 0.0–149.0)
VLDL: 21 mg/dL (ref 0.0–40.0)

## 2017-03-21 NOTE — Progress Notes (Signed)
PCP notes:   Health maintenance:  Flu vaccine - per pt, vaccine in Oct 2018   Abnormal screenings:   None  Patient concerns:   None  Nurse concerns:  None  Next PCP appt:   03/28/17 @ 1415

## 2017-03-21 NOTE — Patient Instructions (Signed)
David Mcgee , Thank you for taking time to come for your Medicare Wellness Visit. I appreciate your ongoing commitment to your health goals. Please review the following plan we discussed and let me know if I can assist you in the future.   These are the goals we discussed: Goals    . Increase physical activity          Starting 03/21/2017, I will continue to exercise for at least 60 min 3 days per week.        This is a list of the screening recommended for you and due dates:  Health Maintenance  Topic Date Due  . Stool Blood Test  11/30/2028*  . Tetanus Vaccine  03/06/2027  . Flu Shot  Completed  .  Hepatitis C: One time screening is recommended by Center for Disease Control  (CDC) for  adults born from [birth year] through 1966.   Completed  . Pneumonia vaccines  Completed  *Topic was postponed. The date shown is not the original due date.   Preventive Care for Adults  A healthy lifestyle and preventive care can promote health and wellness. Preventive health guidelines for adults include the following key practices.  . A routine yearly physical is a good way to check with your health care provider about your health and preventive screening. It is a chance to share any concerns and updates on your health and to receive a thorough exam.  . Visit your dentist for a routine exam and preventive care every 6 months. Brush your teeth twice a day and floss once a day. Good oral hygiene prevents tooth decay and gum disease.  . The frequency of eye exams is based on your age, health, family medical history, use  of contact lenses, and other factors. Follow your health care provider's recommendations for frequency of eye exams.  . Eat a healthy diet. Foods like vegetables, fruits, whole grains, low-fat dairy products, and lean protein foods contain the nutrients you need without too many calories. Decrease your intake of foods high in solid fats, added sugars, and salt. Eat the right amount of  calories for you. Get information about a proper diet from your health care provider, if necessary.  . Regular physical exercise is one of the most important things you can do for your health. Most adults should get at least 150 minutes of moderate-intensity exercise (any activity that increases your heart rate and causes you to sweat) each week. In addition, most adults need muscle-strengthening exercises on 2 or more days a week.  Silver Sneakers may be a benefit available to you. To determine eligibility, you may visit the website: www.silversneakers.com or contact program at [phone] Mon-Fri between 8AM-8PM.   . Maintain a healthy weight. The body mass index (BMI) is a screening tool to identify possible weight problems. It provides an estimate of body fat based on height and weight. Your health care provider can find your BMI and can help you achieve or maintain a healthy weight.   For adults 20 years and older: ? A BMI below 18.5 is considered underweight. ? A BMI of 18.5 to 24.9 is normal. ? A BMI of 25 to 29.9 is considered overweight. ? A BMI of 30 and above is considered obese.   . Maintain normal blood lipids and cholesterol levels by exercising and minimizing your intake of saturated fat. Eat a balanced diet with plenty of fruit and vegetables. Blood tests for lipids and cholesterol should begin at age 1  and be repeated every 5 years. If your lipid or cholesterol levels are high, you are over 50, or you are at high risk for heart disease, you may need your cholesterol levels checked more frequently. Ongoing high lipid and cholesterol levels should be treated with medicines if diet and exercise are not working.  . If you smoke, find out from your health care provider how to quit. If you do not use tobacco, please do not start.  . If you choose to drink alcohol, please do not consume more than 2 drinks per day. One drink is considered to be 12 ounces (355 mL) of beer, 5 ounces  (148 mL) of wine, or 1.5 ounces (44 mL) of liquor.  . If you are 11-52 years old, ask your health care provider if you should take aspirin to prevent strokes.  . Use sunscreen. Apply sunscreen liberally and repeatedly throughout the day. You should seek shade when your shadow is shorter than you. Protect yourself by wearing long sleeves, pants, a wide-brimmed hat, and sunglasses year round, whenever you are outdoors.  . Once a month, do a whole body skin exam, using a mirror to look at the skin on your back. Tell your health care provider of new moles, moles that have irregular borders, moles that are larger than a pencil eraser, or moles that have changed in shape or color.

## 2017-03-21 NOTE — Progress Notes (Signed)
Subjective:   David Mcgee is a 71 y.o. male who presents for Medicare Annual/Subsequent preventive examination.  Review of Systems:  N/A Cardiac Risk Factors include: advanced age (>30men, >26 women);hypertension;male gender     Objective:    Vitals: BP 116/80 (BP Location: Right Arm, Patient Position: Sitting, Cuff Size: Normal)   Pulse (!) 56   Temp 97.6 F (36.4 C) (Oral)   Ht 6' 1.5" (1.867 m) Comment: no shoes  Wt 226 lb (102.5 kg)   SpO2 97%   BMI 29.41 kg/m   Body mass index is 29.41 kg/m.  Tobacco History  Smoking Status  . Former Smoker  . Packs/day: 1.50  . Years: 9.00  . Types: Cigarettes  . Quit date: 05/22/1969  Smokeless Tobacco  . Never Used    Comment: quit in 1972     Counseling given: No   Past Medical History:  Diagnosis Date  . Arthritis    back, knees, hands - OA  . Cancer (HCC)    Skin, not melanoma  . Chronic kidney disease    loss of kidney function - L - since 1990's, h/o of renal calculi  . Depression    ? panic attacks, takes sertraline & also sees psych q 3 months   . GERD (gastroesophageal reflux disease)   . H/O hiatal hernia   . History of stress test    long time ago- wnl  . Hypertension   . Insomnia   . Palpitations   . PTSD (post-traumatic stress disorder)   . PTSD (post-traumatic stress disorder)    post [place]  . Spinal cord cysts    Past Surgical History:  Procedure Laterality Date  . ESOPHAGOGASTRODUODENOSCOPY  06/1995   Dilitation bx negative (Dr. Epifanio Lesches)  . Hernoiorraphy     Right; years ago  . JOINT REPLACEMENT Bilateral   . KNEE ARTHROPLASTY Right 08/18/2015   Procedure: COMPUTER ASSISTED TOTAL KNEE ARTHROPLASTY;  Surgeon: Dereck Leep, MD;  Location: [facility] ORS;  Service: Orthopedics;  Laterality: Right;  . KNEE ARTHROSCOPY  10/2000   Right Knee (Dr. Sabra Heck)  . LUMBAR LAMINECTOMY/DECOMPRESSION MICRODISCECTOMY Right 12/09/2012   Procedure: LUMBAR LAMINECTOMY/DECOMPRESSION MICRODISCECTOMY 1 LEVEL;   Surgeon: Elaina Hoops, MD;  Location: [place] [facility];  Service: Neurosurgery;  Laterality: Right;  LUMBAR LAMINECTOMY/DECOMPRESSION MICRODISCECTOMY 1 LEVEL  . MOHS SURGERY  01/26/2017   nose  . NEUROMA SURGERY     Right foot Morton's neuroma excision  . TONSILLECTOMY    . TOTAL KNEE ARTHROPLASTY     Family History  Problem Relation Age of Onset  . Hypertension Mother   . Diabetes Mother   . Kidney failure Father   . Hypertension Father   . Obesity Sister   . Diabetes Sister   . Diabetes Brother   . Hypertension Brother   . Hyperlipidemia Brother   . Stroke Brother   . Depression Brother        ?  . Colon cancer Neg Hx   . Prostate cancer Neg Hx    History  Sexual Activity  . Sexual activity: Yes    Outpatient Encounter Prescriptions as of 03/21/2017  Medication Sig  . amLODipine (NORVASC) 10 MG tablet Take 1 tablet (10 mg total) by mouth at bedtime.  Marland Kitchen aspirin 81 MG tablet Take 81 mg by mouth daily.  Marland Kitchen atorvastatin (LIPITOR) 10 MG tablet Take 10 mg by mouth daily.  Marland Kitchen lisinopril (PRINIVIL,ZESTRIL) 10 MG tablet 2.5mg  a day.  . Multiple Vitamin (MULTIVITAMIN)  tablet Take 1 tablet by mouth daily.  . Omega-3 Fatty Acids (FISH OIL) 1200 MG CAPS Take 1 capsule by mouth 2 (two) times daily. 2 capsules at night and 1 capsule in am.  . omeprazole (PRILOSEC) 20 MG capsule Take 1 capsule (20 mg total) by mouth 2 (two) times daily.  . sertraline (ZOLOFT) 50 MG tablet Take 0.5 tablets (25 mg total) by mouth at bedtime.  . tamsulosin (FLOMAX) 0.4 MG CAPS capsule Take 0.4 mg by mouth every morning.  . traZODone (DESYREL) 50 MG tablet Take 0.5-1 tablets (25-50 mg total) by mouth at bedtime as needed for sleep.  . [DISCONTINUED] traMADol (ULTRAM) 50 MG tablet Take 1-2 tablets (50-100 mg total) by mouth every 4 (four) hours as needed for moderate pain.   No facility-administered encounter medications on file as of 03/21/2017.     Activities of Daily Living In your present state of health,  do you have any difficulty performing the following activities: 03/21/2017  Hearing? Y  Vision? N  Difficulty concentrating or making decisions? N  Walking or climbing stairs? N  Dressing or bathing? N  Doing errands, shopping? N  Preparing Food and eating ? N  Using the Toilet? N  In the past six months, have you accidently leaked urine? N  Do you have problems with loss of bowel control? N  Managing your Medications? N  Managing your Finances? N  Housekeeping or managing your Housekeeping? N  Some recent data might be hidden    Patient Care Team: Tonia Ghent, MD as PCP - General (Family Medicine) Tamsen Meek, MD as Referring Physician (Dermatology) Jannet Mantis, MD as Referring Physician (Dermatology) Jennette Kettle, MD as Referring Physician (Internal Medicine)   Assessment:     Visual Acuity Screening   Right eye Left eye Both eyes  Without correction:     With correction: 20/25 20/20 20/15-1  Comments: [facility] exams [facility]  Hearing Screening Comments: Bilateral hearing aids   Exercise Activities and Dietary recommendations Current Exercise Habits: Home exercise routine, Type of exercise: strength training/weights;stretching;Other - see comments (recumbent bike), Time (Minutes): 60, Frequency (Times/Week): 3, Weekly Exercise (Minutes/Week): 180, Intensity: Moderate, Exercise limited by: None identified  Goals    . Increase physical activity          Starting 03/21/2017, I will continue to exercise for at least 60 min 3 days per week.       Fall Risk Fall Risk  03/21/2017 12/01/2015 12/07/2014 01/23/2013  Falls in the past year? No No No No   Depression Screen PHQ 2/9 Scores 03/21/2017 12/01/2015 12/07/2014 01/23/2013  PHQ - 2 Score 0 0 0 2  PHQ- 9 Score 0 - - -    Cognitive Function MMSE - Mini Mental State Exam 03/21/2017 12/01/2015  Orientation to time 5 5  Orientation to Place 5 5  Registration 3 3  Attention/ Calculation 0 0    Recall 3 3  Language- name 2 objects 0 0  Language- repeat 1 1  Language- follow 3 step command 3 3  Language- read & follow direction 0 0  Write a sentence 0 0  Copy design 0 0  Total score 20 20     PLEASE NOTE: A Mini-Cog screen was completed. Maximum score is 20. A value of 0 denotes this part of [place] MMSE was not completed or the patient failed this part of the Mini-Cog screening.   Mini-Cog Screening Orientation to Time - Max 5 pts  Orientation to Place - Max 5 pts Registration - Max 3 pts Recall - Max 3 pts Language Repeat - Max 1 pts Language Follow 3 Step Command - Max 3 pts     Immunization History  Administered Date(s) Administered  . Influenza, High Dose Seasonal PF 02/13/2014  . Influenza-Unspecified 02/28/2017  . Pneumococcal Polysaccharide-23 09/22/2010  . Td 10/01/2008  . Tdap 03/05/2017  . Zoster 05/22/2013   Screening Tests Health Maintenance  Topic Date Due  . COLON CANCER SCREENING ANNUAL FOBT  11/30/2028 (Originally 11/07/2012)  . TETANUS/TDAP  03/06/2027  . INFLUENZA VACCINE  Completed  . Hepatitis C Screening  Completed  . PNA vac Low Risk Adult  Completed      Plan:     I have personally reviewed, addressed, and noted the following in the patient's chart:  A. Medical and social history B. Use of alcohol, tobacco or illicit drugs  C. Current medications and supplements D. Functional ability and status E.  Nutritional status F.  Physical activity G. Advance directives H. List of other physicians I.  Hospitalizations, surgeries, and ER visits in previous 12 months J.  [place] to include hearing, vision, cognitive, depression L. Referrals and appointments - none  In addition, I have reviewed and discussed with patient certain preventive protocols, quality metrics, and best practice recommendations. A written personalized care plan for preventive services as well as general preventive health recommendations were provided to  patient.  See attached scanned questionnaire for additional information.   Signed,   Lindell Noe, MHA, BS, LPN Health Coach

## 2017-03-21 NOTE — Progress Notes (Signed)
Pre visit review using our clinic review tool, if applicable. No additional management support is needed unless otherwise documented below in the visit note. 

## 2017-03-23 NOTE — Progress Notes (Signed)
I reviewed health advisor's note, was available for consultation, and agree with documentation and plan.  

## 2017-03-28 ENCOUNTER — Ambulatory Visit (INDEPENDENT_AMBULATORY_CARE_PROVIDER_SITE_OTHER): Payer: PPO | Admitting: Family Medicine

## 2017-03-28 ENCOUNTER — Encounter: Payer: Self-pay | Admitting: Family Medicine

## 2017-03-28 VITALS — BP 128/80 | HR 64 | Temp 97.9°F | Wt 229.0 lb

## 2017-03-28 DIAGNOSIS — Z0000 Encounter for general adult medical examination without abnormal findings: Secondary | ICD-10-CM

## 2017-03-28 DIAGNOSIS — F4310 Post-traumatic stress disorder, unspecified: Secondary | ICD-10-CM

## 2017-03-28 DIAGNOSIS — N400 Enlarged prostate without lower urinary tract symptoms: Secondary | ICD-10-CM | POA: Diagnosis not present

## 2017-03-28 DIAGNOSIS — E785 Hyperlipidemia, unspecified: Secondary | ICD-10-CM | POA: Diagnosis not present

## 2017-03-28 DIAGNOSIS — I10 Essential (primary) hypertension: Secondary | ICD-10-CM | POA: Diagnosis not present

## 2017-03-28 DIAGNOSIS — R7989 Other specified abnormal findings of blood chemistry: Secondary | ICD-10-CM

## 2017-03-28 DIAGNOSIS — G4700 Insomnia, unspecified: Secondary | ICD-10-CM | POA: Diagnosis not present

## 2017-03-28 DIAGNOSIS — Z7189 Other specified counseling: Secondary | ICD-10-CM

## 2017-03-28 MED ORDER — SERTRALINE HCL 25 MG PO TABS: 12.5000 mg | ORAL_TABLET | Freq: Every day | ORAL | Status: DC

## 2017-03-28 NOTE — Patient Instructions (Addendum)
Ask the [place] if you have had the prevnar vaccine (PNA-13).   Ask the [place] if they have checked your PSA.   Check with your insurance to see if they will cover the shingrix shot. Take care.  Glad to see you.  Update me as needed.

## 2017-03-28 NOTE — Progress Notes (Signed)
Hypertension:    Using medication without problems or lightheadedness: yes Chest pain with exertion:no Edema:no Short of breath:no Labs d/w pt.    Elevated Cholesterol: Using medications without problems:yes Muscle aches: no Diet compliance: encouraged Exercise: 3x/week, about 1 hour at a time.   Insomnia tx'd with trazodone prn, with relief.  Not nightly use.  No ADE on med.    On flomax.  Variable stream.  No nocturia usually, rare sx. No burning with urination.  Compliant.  He notes a change with a missed dose.  D/w pt.  Can tolerate as is.    Still on sertraline at baseline.  Used intermittently.  It blunts his emotions with 1/2 dose.  D/w pt.  No SI/HI.  D/w pt about lower dose, he thought his dose was too high.  See orders.    Flu 2018 Shingles 2015 at [facility] clinic PNA d/w pt.   Tdap done 3 weeks at the [place], 2018.    Colonoscopy 2016 per [place] [facility].   Prostate cancer screening and PSA options (with potential risks and benefits of testing vs not testing) were discussed along with recent recs/guidelines.  He declined testing PSA at this point. Advance directive- wife designated if patient were incapacitated.   See AVS.   Meds, vitals, and allergies reviewed.   PMH and SH reviewed  ROS: Per HPI unless specifically indicated in ROS section   GEN: nad, alert and oriented HEENT: mucous membranes moist NECK: supple w/o LA CV: rrr. PULM: ctab, no inc wob ABD: soft, +bs EXT: no edema SKIN: no acute rash

## 2017-03-29 DIAGNOSIS — N400 Enlarged prostate without lower urinary tract symptoms: Secondary | ICD-10-CM | POA: Insufficient documentation

## 2017-03-29 DIAGNOSIS — G4700 Insomnia, unspecified: Secondary | ICD-10-CM | POA: Insufficient documentation

## 2017-03-29 DIAGNOSIS — Z0000 Encounter for general adult medical examination without abnormal findings: Secondary | ICD-10-CM | POA: Insufficient documentation

## 2017-03-29 NOTE — Assessment & Plan Note (Signed)
Using trazodone as needed.  With relief.  Does not need nightly.  No adverse effect of medication.  Continue as is.

## 2017-03-29 NOTE — Assessment & Plan Note (Signed)
Flu 2018 Shingles 2015 at [facility] clinic PNA d/w pt.   Tdap done 3 weeks at the [place].   Colonoscopy 2016 per [place] clinic.   Prostate cancer screening and PSA options (with potential risks and benefits of testing vs not testing) were discussed along with recent recs/guidelines.  He declined testing PSA at this point. Advance directive- wife designated if patient were incapacitated.

## 2017-03-29 NOTE — Assessment & Plan Note (Signed)
Discussed with patient.  Resolved.  Back to baseline.  Labs discussed with patient.

## 2017-03-29 NOTE — Assessment & Plan Note (Addendum)
Controlled.  Continue as is.  No change in meds.  Labs discussed with patient.  He agrees. Continue work on diet and exercise. >25 minutes spent in face to face time with patient, >50% spent in counselling or coordination of care.

## 2017-03-29 NOTE — Assessment & Plan Note (Signed)
Advance directive- wife designated if patient were incapacitated.  

## 2017-03-29 NOTE — Assessment & Plan Note (Signed)
Symptoms controlled with Flomax.  He notices increase in symptoms if he does not take his medicine on a regular basis.  Continue as is.  He agrees.

## 2017-03-29 NOTE — Assessment & Plan Note (Signed)
Controlled.  Continue as is.  No change in meds.  Labs discussed with patient.  He agrees.

## 2017-03-29 NOTE — Assessment & Plan Note (Signed)
He thought that the current dose of sertraline was blunting his affect too much.  Decreased down to 12.5 mg a day.  He may only need 12.5 mg every other day.  No suicidal or homicidal intent.  Okay for outpatient follow-up.  He agrees.

## 2017-06-08 DIAGNOSIS — M25512 Pain in left shoulder: Secondary | ICD-10-CM | POA: Diagnosis not present

## 2017-06-08 DIAGNOSIS — M7522 Bicipital tendinitis, left shoulder: Secondary | ICD-10-CM | POA: Diagnosis not present

## 2017-06-08 DIAGNOSIS — S46112A Strain of muscle, fascia and tendon of long head of biceps, left arm, initial encounter: Secondary | ICD-10-CM | POA: Diagnosis not present

## 2017-06-08 DIAGNOSIS — G8929 Other chronic pain: Secondary | ICD-10-CM | POA: Diagnosis not present

## 2017-06-08 DIAGNOSIS — M7582 Other shoulder lesions, left shoulder: Secondary | ICD-10-CM | POA: Diagnosis not present

## 2017-06-11 ENCOUNTER — Other Ambulatory Visit: Payer: Self-pay | Admitting: Student

## 2017-06-11 DIAGNOSIS — M7582 Other shoulder lesions, left shoulder: Secondary | ICD-10-CM

## 2017-06-14 ENCOUNTER — Ambulatory Visit: Payer: PPO

## 2017-06-14 ENCOUNTER — Ambulatory Visit (HOSPITAL_COMMUNITY): Payer: PPO

## 2017-07-18 DIAGNOSIS — Z86018 Personal history of other benign neoplasm: Secondary | ICD-10-CM | POA: Diagnosis not present

## 2017-07-18 DIAGNOSIS — Z872 Personal history of diseases of the skin and subcutaneous tissue: Secondary | ICD-10-CM | POA: Diagnosis not present

## 2017-07-18 DIAGNOSIS — Z85828 Personal history of other malignant neoplasm of skin: Secondary | ICD-10-CM | POA: Diagnosis not present

## 2017-07-18 DIAGNOSIS — L578 Other skin changes due to chronic exposure to nonionizing radiation: Secondary | ICD-10-CM | POA: Diagnosis not present

## 2017-07-18 DIAGNOSIS — L570 Actinic keratosis: Secondary | ICD-10-CM | POA: Diagnosis not present

## 2017-08-16 DIAGNOSIS — Z96653 Presence of artificial knee joint, bilateral: Secondary | ICD-10-CM | POA: Diagnosis not present

## 2017-11-28 DIAGNOSIS — M7522 Bicipital tendinitis, left shoulder: Secondary | ICD-10-CM | POA: Diagnosis not present

## 2017-11-28 DIAGNOSIS — M7582 Other shoulder lesions, left shoulder: Secondary | ICD-10-CM | POA: Diagnosis not present

## 2018-01-16 DIAGNOSIS — L578 Other skin changes due to chronic exposure to nonionizing radiation: Secondary | ICD-10-CM | POA: Diagnosis not present

## 2018-01-16 DIAGNOSIS — Z85828 Personal history of other malignant neoplasm of skin: Secondary | ICD-10-CM | POA: Diagnosis not present

## 2018-01-16 DIAGNOSIS — L570 Actinic keratosis: Secondary | ICD-10-CM | POA: Diagnosis not present

## 2018-01-16 DIAGNOSIS — Z86018 Personal history of other benign neoplasm: Secondary | ICD-10-CM | POA: Diagnosis not present

## 2018-01-16 DIAGNOSIS — Z1283 Encounter for screening for malignant neoplasm of skin: Secondary | ICD-10-CM | POA: Diagnosis not present

## 2018-03-26 ENCOUNTER — Ambulatory Visit: Payer: PPO

## 2018-03-26 ENCOUNTER — Other Ambulatory Visit: Payer: Self-pay | Admitting: Family Medicine

## 2018-03-26 DIAGNOSIS — I10 Essential (primary) hypertension: Secondary | ICD-10-CM

## 2018-03-27 ENCOUNTER — Ambulatory Visit: Payer: PPO

## 2018-03-29 ENCOUNTER — Ambulatory Visit (INDEPENDENT_AMBULATORY_CARE_PROVIDER_SITE_OTHER): Payer: PPO

## 2018-03-29 VITALS — BP 122/80 | HR 76 | Temp 97.9°F | Ht 74.0 in | Wt 230.8 lb

## 2018-03-29 DIAGNOSIS — I10 Essential (primary) hypertension: Secondary | ICD-10-CM | POA: Diagnosis not present

## 2018-03-29 DIAGNOSIS — Z0000 Encounter for general adult medical examination without abnormal findings: Secondary | ICD-10-CM | POA: Diagnosis not present

## 2018-03-29 LAB — LIPID PANEL
CHOL/HDL RATIO: 3
CHOLESTEROL: 122 mg/dL (ref 0–200)
HDL: 39.3 mg/dL (ref >39.00)
LDL Cholesterol: 62 mg/dL (ref 0–99)
NonHDL: 82.92
TRIGLYCERIDES: 105 mg/dL (ref 0.0–149.0)
VLDL: 21 mg/dL (ref 0.0–40.0)

## 2018-03-29 LAB — COMPREHENSIVE METABOLIC PANEL
ALBUMIN: 4.6 g/dL (ref 3.5–5.2)
ALT: 27 U/L (ref 0–53)
AST: 24 U/L (ref 0–37)
Alkaline Phosphatase: 42 U/L (ref 39–117)
BILIRUBIN TOTAL: 1 mg/dL (ref 0.2–1.2)
BUN: 14 mg/dL (ref 6–23)
CALCIUM: 9.8 mg/dL (ref 8.4–10.5)
CO2: 31 meq/L (ref 19–32)
CREATININE: 1.57 mg/dL — AB (ref 0.40–1.50)
Chloride: 102 mEq/L (ref 96–112)
GFR: 46.19 mL/min — ABNORMAL LOW (ref >60.00)
Glucose, Bld: 103 mg/dL — ABNORMAL HIGH (ref 70–99)
Potassium: 4.9 mEq/L (ref 3.5–5.1)
Sodium: 138 mEq/L (ref 135–145)
Total Protein: 7.6 g/dL (ref 6.0–8.3)

## 2018-03-29 NOTE — Patient Instructions (Signed)
David Mcgee , Thank you for taking time to come for your Medicare Wellness Visit. I appreciate your ongoing commitment to your health goals. Please review the following plan we discussed and let me know if I can assist you in the future.   These are the goals we discussed: Goals    . Increase physical activity     Starting 03/29/2018, I will continue to exercise for at least 60 min 3 days per week.        This is a list of the screening recommended for you and due dates:  Health Maintenance  Topic Date Due  . Stool Blood Test  11/30/2028*  . Colon Cancer Screening  04/20/2025  . Tetanus Vaccine  03/06/2027  . Flu Shot  Completed  .  Hepatitis C: One time screening is recommended by Center for Disease Control  (CDC) for  adults born from [birth year] through 1966.   Completed  . Pneumonia vaccines  Completed  *Topic was postponed. The date shown is not the original due date.   Preventive Care for Adults  A healthy lifestyle and preventive care can promote health and wellness. Preventive health guidelines for adults include the following key practices.  . A routine yearly physical is a good way to check with your health care provider about your health and preventive screening. It is a chance to share any concerns and updates on your health and to receive a thorough exam.  . Visit your dentist for a routine exam and preventive care every 6 months. Brush your teeth twice a day and floss once a day. Good oral hygiene prevents tooth decay and gum disease.  . The frequency of eye exams is based on your age, health, family medical history, use  of contact lenses, and other factors. Follow your health care provider's recommendations for frequency of eye exams.  . Eat a healthy diet. Foods like vegetables, fruits, whole grains, low-fat dairy products, and lean protein foods contain the nutrients you need without too many calories. Decrease your intake of foods high in solid fats, added sugars, and salt.  Eat the right amount of calories for you. Get information about a proper diet from your health care provider, if necessary.  . Regular physical exercise is one of the most important things you can do for your health. Most adults should get at least 150 minutes of moderate-intensity exercise (any activity that increases your heart rate and causes you to sweat) each week. In addition, most adults need muscle-strengthening exercises on 2 or more days a week.  Silver Sneakers may be a benefit available to you. To determine eligibility, you may visit the website: www.silversneakers.com or contact program at (385)221-3105 Mon-Fri between 8AM-8PM.   . Maintain a healthy weight. The body mass index (BMI) is a screening tool to identify possible weight problems. It provides an estimate of body fat based on height and weight. Your health care provider can find your BMI and can help you achieve or maintain a healthy weight.   For adults 20 years and older: ? A BMI below 18.5 is considered underweight. ? A BMI of 18.5 to 24.9 is normal. ? A BMI of 25 to 29.9 is considered overweight. ? A BMI of 30 and above is considered obese.   . Maintain normal blood lipids and cholesterol levels by exercising and minimizing your intake of saturated fat. Eat a balanced diet with plenty of fruit and vegetables. Blood tests for lipids and cholesterol should begin at  age 56 and be repeated every 5 years. If your lipid or cholesterol levels are high, you are over 50, or you are at high risk for heart disease, you may need your cholesterol levels checked more frequently. Ongoing high lipid and cholesterol levels should be treated with medicines if diet and exercise are not working.  . If you smoke, find out from your health care provider how to quit. If you do not use tobacco, please do not start.  . If you choose to drink alcohol, please do not consume more than 2 drinks per day. One drink is considered to be 12 ounces (355  mL) of beer, 5 ounces (148 mL) of wine, or 1.5 ounces (44 mL) of liquor.  . If you are 28-45 years old, ask your health care provider if you should take aspirin to prevent strokes.  . Use sunscreen. Apply sunscreen liberally and repeatedly throughout the day. You should seek shade when your shadow is shorter than you. Protect yourself by wearing long sleeves, pants, a wide-brimmed hat, and sunglasses year round, whenever you are outdoors.  . Once a month, do a whole body skin exam, using a mirror to look at the skin on your back. Tell your health care provider of new moles, moles that have irregular borders, moles that are larger than a pencil eraser, or moles that have changed in shape or color.

## 2018-03-29 NOTE — Progress Notes (Signed)
PCP notes:   Health maintenance:  No gaps identified.  Abnormal screenings:   None  Patient concerns:   None  Nurse concerns:  None  Next PCP appt:   04/01/18 @ 0945  I reviewed health advisor's note, was available for consultation on the day of service listed in this note, and agree with documentation and plan. Elsie Stain, MD.

## 2018-03-29 NOTE — Progress Notes (Signed)
Subjective:   David Mcgee is a 72 y.o. male who presents for Medicare Annual/Subsequent preventive examination.  Review of Systems:  N/A Cardiac Risk Factors include: advanced age (>21men, >41 women);hypertension;male gender     Objective:    Vitals: BP 122/80 (BP Location: Right Arm, Patient Position: Sitting, Cuff Size: Large)   Pulse 76   Temp 97.9 F (36.6 C) (Oral)   Ht 6\' 2"  (1.88 m) Comment: shoes  Wt 230 lb 12 oz (104.7 kg)   SpO2 96%   BMI 29.63 kg/m   Body mass index is 29.63 kg/m.  Advanced Directives 03/29/2018 03/21/2017 12/01/2015 08/18/2015 08/18/2015 08/18/2015 08/05/2015  Does Patient Have a Medical Advance Directive? Yes Yes Yes Yes - Yes Yes  Type of Advance Directive [place];Living will [place];Living will [place];Living will Living will - [place];Living will Living will;Healthcare Power of Attorney  Does patient want to make changes to medical advance directive? - - No - Patient declined No - Patient declined - No - Patient declined No - Patient declined  Copy of [place] in Chart? No - copy requested No - copy requested No - copy requested No - copy requested No - copy requested - No - copy requested  Pre-existing out of facility DNR order (yellow form or pink MOST form) - - - - - - -    Tobacco Social History   Tobacco Use  Smoking Status Former Smoker  . Packs/day: 1.50  . Years: 9.00  . Pack years: 13.50  . Types: Cigarettes  . Last attempt to quit: 05/22/1969  . Years since quitting: 48.8  Smokeless Tobacco Never Used  Tobacco Comment   quit in 1972     Counseling given: No Comment: quit in 1972   Clinical Intake:  Pre-visit preparation completed: Yes  Pain : No/denies pain Pain Score: 0-No pain     Nutritional Status: BMI 25 -29 Overweight Nutritional Risks: None Diabetes: No  How often do you need to have someone help you when  you read instructions, pamphlets, or other written materials from your doctor or pharmacy?: 1 - Never What is the last grade level you completed in school?: 12th grade  Interpreter Needed?: No  Comments: pt lives with spouse Information entered by :: LPinson, LPN  Past Medical History:  Diagnosis Date  . Arthritis    back, knees, hands - OA  . Cancer (HCC)    Skin, not melanoma  . Chronic kidney disease    loss of kidney function - L - since 1990's, h/o of renal calculi  . Depression    ? panic attacks, takes sertraline & also sees psych q 3 months   . GERD (gastroesophageal reflux disease)   . H/O hiatal hernia   . History of stress test    long time ago- wnl  . Hypertension   . Insomnia   . Palpitations   . PTSD (post-traumatic stress disorder)   . PTSD (post-traumatic stress disorder)    post [place]  . Spinal cord cysts    Past Surgical History:  Procedure Laterality Date  . ESOPHAGOGASTRODUODENOSCOPY  06/1995   Dilitation bx negative (Dr. Epifanio Lesches)  . Hernoiorraphy     Right; years ago  . JOINT REPLACEMENT Bilateral   . KNEE ARTHROPLASTY Right 08/18/2015   Procedure: COMPUTER ASSISTED TOTAL KNEE ARTHROPLASTY;  Surgeon: Dereck Leep, MD;  Location: [facility] ORS;  Service: Orthopedics;  Laterality: Right;  .  KNEE ARTHROSCOPY  10/2000   Right Knee (Dr. Sabra Heck)  . LUMBAR LAMINECTOMY/DECOMPRESSION MICRODISCECTOMY Right 12/09/2012   Procedure: LUMBAR LAMINECTOMY/DECOMPRESSION MICRODISCECTOMY 1 LEVEL;  Surgeon: Elaina Hoops, MD;  Location: [place] [facility] [facility];  Service: Neurosurgery;  Laterality: Right;  LUMBAR LAMINECTOMY/DECOMPRESSION MICRODISCECTOMY 1 LEVEL  . MOHS SURGERY  01/26/2017   nose  . NEUROMA SURGERY     Right foot Morton's neuroma excision  . TONSILLECTOMY    . TOTAL KNEE ARTHROPLASTY     Family History  Problem Relation Age of Onset  . Hypertension Mother   . Diabetes Mother   . Kidney failure Father   . Hypertension Father   . Obesity Sister   . Diabetes Sister    . Diabetes Brother   . Hypertension Brother   . Hyperlipidemia Brother   . Stroke Brother   . Depression Brother        ?  . Colon cancer Neg Hx   . Prostate cancer Neg Hx    Social History   Socioeconomic History  . Marital status: Married    Spouse name: Not on file  . Number of children: Not on file  . Years of education: Not on file  . Highest education level: Not on file  Occupational History  . Occupation: Retired     Fish farm manager: [employer]  . Occupation: Now Air traffic controller  . Occupation: Maintenance at [employer]  . Financial resource strain: Not on file  . Food insecurity:    Worry: Not on file    Inability: Not on file  . Transportation needs:    Medical: Not on file    Non-medical: Not on file  Tobacco Use  . Smoking status: Former Smoker    Packs/day: 1.50    Years: 9.00    Pack years: 13.50    Types: Cigarettes    Last attempt to quit: 05/22/1969    Years since quitting: 48.8  . Smokeless tobacco: Never Used  . Tobacco comment: quit in 1972  Substance and Sexual Activity  . Alcohol use: No  . Drug use: No  . Sexual activity: Yes  Lifestyle  . Physical activity:    Days per week: Not on file    Minutes per session: Not on file  . Stress: Not on file  Relationships  . Social connections:    Talks on phone: Not on file    Gets together: Not on file    Attends religious service: Not on file    Active member of club or organization: Not on file    Attends meetings of clubs or organizations: Not on file    Relationship status: Not on file  Other Topics Concern  . 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  Agent orange exposure.   Retired from [employer], Engineer, production    Outpatient Encounter Medications as of 03/29/2018  Medication Sig  . amLODipine (NORVASC) 10 MG tablet Take 1 tablet (10 mg total) by mouth at bedtime.  Marland Kitchen aspirin 81 MG tablet  Take 81 mg by mouth daily.  Marland Kitchen atorvastatin (LIPITOR) 10 MG tablet Take 10 mg by mouth daily.  Marland Kitchen lisinopril (PRINIVIL,ZESTRIL) 10 MG tablet 2.5mg  a day.  . Multiple Vitamin (MULTIVITAMIN) tablet Take 1 tablet by mouth daily.  . Omega-3 Fatty Acids (FISH OIL) 1200 MG CAPS Take 1 capsule by mouth 2 (  two) times daily. 2 capsules at night and 1 capsule in am.  . omeprazole (PRILOSEC) 20 MG capsule Take 1 capsule (20 mg total) by mouth 2 (two) times daily.  . sertraline (ZOLOFT) 25 MG tablet Take 0.5 tablets (12.5 mg total) daily by mouth. May need to cut back to every other day.  . tamsulosin (FLOMAX) 0.4 MG CAPS capsule Take 0.4 mg by mouth every morning.  . traZODone (DESYREL) 50 MG tablet Take 0.5-1 tablets (25-50 mg total) by mouth at bedtime as needed for sleep.   No facility-administered encounter medications on file as of 03/29/2018.     Activities of Daily Living In your present state of health, do you have any difficulty performing the following activities: 03/29/2018  Hearing? Y  Comment wears hearing aids  Vision? N  Difficulty concentrating or making decisions? N  Walking or climbing stairs? N  Dressing or bathing? N  Doing errands, shopping? N  Preparing Food and eating ? N  Using the Toilet? N  In the past six months, have you accidently leaked urine? N  Do you have problems with loss of bowel control? N  Managing your Medications? N  Managing your Finances? N  Housekeeping or managing your Housekeeping? N  Some recent data might be hidden    Patient Care Team: Tonia Ghent, MD as PCP - General (Family Medicine) Tamsen Meek, MD as Referring Physician (Dermatology) Jannet Mantis, MD as Referring Physician (Dermatology) Jennette Kettle, MD as Referring Physician (Internal Medicine)   Assessment:   This is a routine wellness examination for [place].  Hearing Screening Comments: Bilateral hearing aids Vision Screening Comments: Vision exam in 2018 @  [facility]   Exercise Activities and Dietary recommendations Current Exercise Habits: Home exercise routine, Type of exercise: strength training/weights;Other - see comments(recumbent bike), Time (Minutes): 60, Frequency (Times/Week): 3, Weekly Exercise (Minutes/Week): 180, Intensity: Moderate, Exercise limited by: None identified  Goals    . Increase physical activity     Starting 03/29/2018, I will continue to exercise for at least 60 min 3 days per week.        Fall Risk Fall Risk  03/29/2018 03/21/2017 12/01/2015 12/07/2014 01/23/2013  Falls in the past year? 0 No No No No   Depression Screen PHQ 2/9 Scores 03/29/2018 03/21/2017 12/01/2015 12/07/2014  PHQ - 2 Score 0 0 0 0  PHQ- 9 Score 0 0 - -    Cognitive Function MMSE - Mini Mental State Exam 03/29/2018 03/21/2017 12/01/2015  Orientation to time 5 5 5   Orientation to Place 5 5 5   Registration 3 3 3   Attention/ Calculation 0 0 0  Recall 3 3 3   Language- name 2 objects 0 0 0  Language- repeat 1 1 1   Language- follow 3 step command 3 3 3   Language- read & follow direction 0 0 0  Write a sentence 0 0 0  Copy design 0 0 0  Total score 20 20 20      PLEASE NOTE: A Mini-Cog screen was completed. Maximum score is 20. A value of 0 denotes this part of [place] MMSE was not completed or the patient failed this part of the Mini-Cog screening.   Mini-Cog Screening Orientation to Time - Max 5 pts Orientation to Place - Max 5 pts Registration - Max 3 pts Recall - Max 3 pts Language Repeat - Max 1 pts Language Follow 3 Step Command - Max 3 pts     Immunization History  Administered Date(s) Administered  .  Influenza, High Dose Seasonal PF 02/13/2014  . Influenza,inj,quad, With Preservative 02/19/2018  . Influenza-Unspecified 02/28/2017, 02/14/2018  . Pneumococcal Polysaccharide-23 09/22/2010  . Td 10/01/2008  . Tdap 03/05/2017  . Zoster 05/22/2013    Screening Tests Health Maintenance  Topic Date Due  . COLON CANCER SCREENING  ANNUAL FOBT  11/30/2028 (Originally 11/07/2012)  . COLONOSCOPY  04/20/2025  . TETANUS/TDAP  03/06/2027  . INFLUENZA VACCINE  Completed  . Hepatitis C Screening  Completed  . PNA vac Low Risk Adult  Completed       Plan:     I have personally reviewed, addressed, and noted the following in the patient's chart:  A. Medical and social history B. Use of alcohol, tobacco or illicit drugs  C. Current medications and supplements D. Functional ability and status E.  Nutritional status F.  Physical activity G. Advance directives H. List of other physicians I.  Hospitalizations, surgeries, and ER visits in previous 12 months J.  [place] to include hearing, vision, cognitive, depression L. Referrals and appointments - none  In addition, I have reviewed and discussed with patient certain preventive protocols, quality metrics, and best practice recommendations. A written personalized care plan for preventive services as well as general preventive health recommendations were provided to patient.  See attached scanned questionnaire for additional information.   Signed,   Lindell Noe, MHA, BS, LPN Health Coach

## 2018-04-01 ENCOUNTER — Ambulatory Visit (INDEPENDENT_AMBULATORY_CARE_PROVIDER_SITE_OTHER): Payer: PPO | Admitting: Family Medicine

## 2018-04-01 ENCOUNTER — Encounter: Payer: Self-pay | Admitting: Family Medicine

## 2018-04-01 VITALS — BP 122/80 | HR 76 | Temp 97.9°F | Ht 74.0 in | Wt 230.8 lb

## 2018-04-01 DIAGNOSIS — E785 Hyperlipidemia, unspecified: Secondary | ICD-10-CM

## 2018-04-01 DIAGNOSIS — Z7189 Other specified counseling: Secondary | ICD-10-CM

## 2018-04-01 DIAGNOSIS — I10 Essential (primary) hypertension: Secondary | ICD-10-CM

## 2018-04-01 DIAGNOSIS — N400 Enlarged prostate without lower urinary tract symptoms: Secondary | ICD-10-CM | POA: Diagnosis not present

## 2018-04-01 DIAGNOSIS — G4700 Insomnia, unspecified: Secondary | ICD-10-CM

## 2018-04-01 DIAGNOSIS — K219 Gastro-esophageal reflux disease without esophagitis: Secondary | ICD-10-CM

## 2018-04-01 DIAGNOSIS — Z0000 Encounter for general adult medical examination without abnormal findings: Secondary | ICD-10-CM

## 2018-04-01 DIAGNOSIS — R7989 Other specified abnormal findings of blood chemistry: Secondary | ICD-10-CM | POA: Diagnosis not present

## 2018-04-01 DIAGNOSIS — F4310 Post-traumatic stress disorder, unspecified: Secondary | ICD-10-CM

## 2018-04-01 LAB — BASIC METABOLIC PANEL
BUN: 18 mg/dL (ref 6–23)
CO2: 28 meq/L (ref 19–32)
Calcium: 9.6 mg/dL (ref 8.4–10.5)
Chloride: 104 mEq/L (ref 96–112)
Creatinine, Ser: 1.52 mg/dL — ABNORMAL HIGH (ref 0.40–1.50)
GFR: 47.94 mL/min — ABNORMAL LOW (ref >60.00)
Glucose, Bld: 110 mg/dL — ABNORMAL HIGH (ref 70–99)
POTASSIUM: 4.3 meq/L (ref 3.5–5.1)
Sodium: 138 mEq/L (ref 135–145)

## 2018-04-01 MED ORDER — SERTRALINE HCL 25 MG PO TABS: 12.5000 mg | ORAL_TABLET | Freq: Every day | ORAL | Status: DC

## 2018-04-01 NOTE — Patient Instructions (Signed)
Don't change your meds for now.  Go to the lab on the way out.  We'll contact you with your lab report. We may need to change your lisinopril if your kidney function isn't better.  We may need to change the lipitor if the cramps continue.  Take care.  Glad to see you.

## 2018-04-01 NOTE — Progress Notes (Signed)
Hypertension:    Using medication without problems or lightheadedness: yes Chest pain with exertion:no Edema:no Short of breath:no He has some cramping in his calf R>L, at rest, not exertional.  Better with stretching.    Elevated Cholesterol: Using medications without problems:yes Muscle aches: see above.   Diet compliance: encouraged.   Exercise: doing well, going to the gym 3x/week.    He is taking prilosec 1-2 times per day, depending on sx.  D/w pt.  No ADE on med.   PTSD.  Insomnia with some changes in dreams with trazodone.  He is using melatonin with some partial relief, he is trying to use that preferentially.  D/w pt. Still on SSRI, with relief of panic, anxiety.  No ADE on med.  No SI/HI.  He is still helping out with services for veterans.    Cr elevation.  He has had variable Cr levels over the years, d/w pt. he has been drinking a lot of Pepsi.  Unclear how much this is affecting his creatinine and also the cramping.  Discussed drinking more water.  Flu 2019 Shingles 2015 at [facility] clinic PNA d/w pt.  Prev done at [facility].   Tetanus 2018 Colonoscopy 2016 per [place] clinic.   Prostate cancer screening and PSA options (with potential risks and benefits of testing vs not testing) were discussed along with recent recs/guidelines.  He declined testing PSA at this point. Advance directive- wife designated if patient were incapacitated.   AAA screen 2014.    He is still on flomax and able to tolerate his situation as is.  No burning with urination.  No blood seen in urine.    Meds, vitals, and allergies reviewed.   PMH and SH reviewed  ROS: Per HPI unless specifically indicated in ROS section   GEN: nad, alert and oriented HEENT: mucous membranes moist NECK: supple w/o LA CV: rrr. PULM: ctab, no inc wob ABD: soft, +bs EXT: no edema SKIN: no acute rash Affect speech and judgment appear normal.

## 2018-04-05 ENCOUNTER — Other Ambulatory Visit: Payer: Self-pay | Admitting: Family Medicine

## 2018-04-05 DIAGNOSIS — R7989 Other specified abnormal findings of blood chemistry: Secondary | ICD-10-CM

## 2018-04-05 NOTE — Assessment & Plan Note (Signed)
Flu 2019 Shingles 2015 at [facility] clinic PNA d/w pt.  Prev done at [facility].   Tetanus 2018 Colonoscopy 2016 per [place] clinic.   Prostate cancer screening and PSA options (with potential risks and benefits of testing vs not testing) were discussed along with recent recs/guidelines.  He declined testing PSA at this point. Advance directive- wife designated if patient were incapacitated.   AAA screen 2014.

## 2018-04-05 NOTE — Assessment & Plan Note (Signed)
Insomnia with some changes in dreams with trazodone.  He is using melatonin with some partial relief, he is trying to use that preferentially.  D/w pt. Still on SSRI, with relief of panic, anxiety.  No ADE on med.  No SI/HI.  He is still helping out with services for veterans.  Continue as is.  He agrees.  I thanked him for his effort.

## 2018-04-05 NOTE — Assessment & Plan Note (Signed)
He is still on flomax and able to tolerate his situation as is.  No burning with urination.  No blood seen in urine.  He will update me as needed.

## 2018-04-05 NOTE — Assessment & Plan Note (Signed)
Continue current meds for now.  Continue work on diet and exercise.

## 2018-04-05 NOTE — Assessment & Plan Note (Signed)
He is taking prilosec 1-2 times per day, depending on sx.  D/w pt.  No ADE on med.  Discussed cutting back on Pepsi and drinking more water as that may help.

## 2018-04-05 NOTE — Assessment & Plan Note (Signed)
Advance directive- wife designated if patient were incapacitated.  

## 2018-04-05 NOTE — Assessment & Plan Note (Signed)
We did not change his Lipitor at this point but we may need to if the cramping continues.  I would recheck his labs and have him increase his water intake.

## 2018-04-05 NOTE — Assessment & Plan Note (Addendum)
Variable of the years.  No change in meds at this point.  Recheck today.  See notes on labs.  Increase water intake.  Decrease Pepsi. >25 minutes spent in face to face time with patient, >50% spent in counselling or coordination of care

## 2018-04-05 NOTE — Assessment & Plan Note (Signed)
Insomnia with some changes in dreams with trazodone.  He is using melatonin with some partial relief, he is trying to use that preferentially.  D/w pt. Still on SSRI, with relief of panic, anxiety.  No ADE on med.  No SI/HI.  He is still helping out with services for veterans.

## 2018-04-11 ENCOUNTER — Other Ambulatory Visit (INDEPENDENT_AMBULATORY_CARE_PROVIDER_SITE_OTHER): Payer: PPO

## 2018-04-11 DIAGNOSIS — R7989 Other specified abnormal findings of blood chemistry: Secondary | ICD-10-CM

## 2018-04-11 LAB — BASIC METABOLIC PANEL
BUN: 17 mg/dL (ref 6–23)
CHLORIDE: 103 meq/L (ref 96–112)
CO2: 29 meq/L (ref 19–32)
Calcium: 9.4 mg/dL (ref 8.4–10.5)
Creatinine, Ser: 1.57 mg/dL — ABNORMAL HIGH (ref 0.40–1.50)
GFR: 46.18 mL/min — ABNORMAL LOW (ref >60.00)
Glucose, Bld: 108 mg/dL — ABNORMAL HIGH (ref 70–99)
Potassium: 4.4 mEq/L (ref 3.5–5.1)
Sodium: 139 mEq/L (ref 135–145)

## 2018-06-05 DIAGNOSIS — M7522 Bicipital tendinitis, left shoulder: Secondary | ICD-10-CM | POA: Diagnosis not present

## 2018-06-05 DIAGNOSIS — M7582 Other shoulder lesions, left shoulder: Secondary | ICD-10-CM | POA: Diagnosis not present

## 2018-06-27 ENCOUNTER — Encounter
Admission: RE | Admit: 2018-06-27 | Discharge: 2018-06-27 | Disposition: A | Discharge status: Home / Self Care | Payer: PPO | Source: Ambulatory Visit | Attending: Surgery | Admitting: Surgery

## 2018-06-27 ENCOUNTER — Other Ambulatory Visit: Payer: Self-pay

## 2018-06-27 DIAGNOSIS — Z01818 Encounter for other preprocedural examination: Secondary | ICD-10-CM | POA: Diagnosis not present

## 2018-06-27 DIAGNOSIS — I10 Essential (primary) hypertension: Secondary | ICD-10-CM | POA: Diagnosis not present

## 2018-06-27 LAB — CBC
HCT: 48.1 % (ref 39.0–52.0)
Hemoglobin: 16.3 g/dL (ref 13.0–17.0)
MCH: 29.9 pg (ref 26.0–34.0)
MCHC: 33.9 g/dL (ref 30.0–36.0)
MCV: 88.3 fL (ref 80.0–100.0)
PLATELETS: 183 10*3/uL (ref 150–400)
RBC: 5.45 MIL/uL (ref 4.22–5.81)
RDW: 12.4 % (ref 11.5–15.5)
WBC: 6.1 10*3/uL (ref 4.0–10.5)
nRBC: 0 % (ref 0.0–0.2)

## 2018-06-27 LAB — BASIC METABOLIC PANEL
Anion gap: 8 (ref 5–15)
BUN: 18 mg/dL (ref 8–23)
CHLORIDE: 104 mmol/L (ref 98–111)
CO2: 28 mmol/L (ref 22–32)
CREATININE: 1.38 mg/dL — AB (ref 0.61–1.24)
Calcium: 9.2 mg/dL (ref 8.9–10.3)
GFR calc Af Amer: 58 mL/min — ABNORMAL LOW (ref >60)
GFR, EST NON AFRICAN AMERICAN: 50 mL/min — AB (ref >60)
GLUCOSE: 100 mg/dL — AB (ref 70–99)
Potassium: 4.1 mmol/L (ref 3.5–5.1)
Sodium: 140 mmol/L (ref 135–145)

## 2018-06-27 NOTE — Pre-Procedure Instructions (Signed)
REQUEST/ EKG FOR MEDICAL CLEARANCE FAXED AND LM FOR TIFFANY AT DR [facility]

## 2018-06-27 NOTE — Patient Instructions (Signed)
Your procedure is scheduled on:  Thursday, February 13TH  Report to [facility]   DO NOT STOP ON THE FIRST FLOOR TO REGISTER  To find out your arrival time please call [phone] between 1PM - 3PM on Wednesday, February 12TH  Remember: Instructions that are not followed completely may result in serious medical risk,  up to and including death, or upon the discretion of your surgeon and anesthesiologist your  surgery may need to be rescheduled.     _X__ 1. Do not eat food after midnight the night before your procedure.                 No gum chewing or hard candies.                    ABSOLUTELY NOTHING SOLID IN YOUR MOUTH AFTER MIDNIGHT                                   You may drink clear liquids up to 2 hours before you are scheduled to arrive for your surgery-                   DO not drink clear liquids within 2 hours of the start of your surgery.                   Clear Liquids include:  water, apple juice without pulp, clear carbohydrate                 drink such as Clearfast of Gatorade, Black Coffee or Tea (Do not add                 anything to coffee or tea). YOU MAY ADD SUGAR BUT DO NOT ADD DAIRY PRODUCTS.                    NO HONEY OR LEMON  __X__2.  On the morning of surgery brush your teeth with toothpaste and water,                   You may rinse your mouth with mouthwash if you wish.                      Do not swallow any toothpaste of mouthwash.     _X__ 3.  No Alcohol for 24 hours before or after surgery.   _X__ 4.  Do Not Smoke or use e-cigarettes For 24 Hours Prior to Your Surgery.                 Do not use any chewable tobacco products for at least 6 hours prior to                 surgery.  ____  5.  Bring all medications with you on the day of surgery if instructed.   ____  6.  Notify your doctor if there is any change in your medical condition      (cold, fever, infections).     Do not wear jewelry,  make-up, hairpins, clips or nail polish. Do not wear lotions, powders, or perfumes. You may NOT wear deodorant. Do not shave 48 hours prior to surgery. Men may shave face and neck. Do not bring valuables to the hospital.    [facility] is not responsible for  any belongings or valuables.  Contacts, dentures or bridgework may not be worn into surgery. Leave your suitcase in the car. After surgery it may be brought to your room. For patients admitted to the hospital, discharge time is determined by your treatment team.   Patients discharged the day of surgery will not be allowed to drive home.   Please read over the following fact sheets that you were given:   PREPARING FOR SURGERY   ____ Take these medicines the morning of surgery with A SIP OF WATER:    1. THERATEARS EYE DROPS  2. PRILOSEC \ 3. ZOLOFT, IF NEED IT  4.  5.  6.  ____ Fleet Enema (as directed)   _X___ Use CHG Soap as directed  ____ Use inhalers on the day of surgery  _X_ Stop ALL ASPIRIN PRODUCTS AS OF TODAY  _X___ Stop Anti-inflammatories AS OF TODAY              THIS INCLUDES IBUPROFEN / MOTRIN / ADVIL / ALEVE                  YOU MAY TAKE AT ANY TIME PRIOR TO SURGERY   _X___ Stop supplements until after surgery.                 STOP MULTIVITAMINS AND FISH OIL             YOU MAY CONTINUE TAKING VITAMIN D3 BUT NOT ON THE MORNING OF SURGERY  ____ Bring C-Pap to the hospital.   DO NOT TAKE LISINOPRIL ON THE MORNING OF SURGERY, BUT DO TAKE IT EVERY    DAY UP UNTIL THEN.  CONTINUE TAKING NIGHT TIME MEDICINES AS USUAL.  WEAR A LARGE, LOOSE SHIRT TO [place].

## 2018-07-01 ENCOUNTER — Telehealth: Payer: Self-pay | Admitting: Family Medicine

## 2018-07-01 ENCOUNTER — Ambulatory Visit (INDEPENDENT_AMBULATORY_CARE_PROVIDER_SITE_OTHER): Payer: PPO | Admitting: Family Medicine

## 2018-07-01 ENCOUNTER — Encounter: Payer: Self-pay | Admitting: Family Medicine

## 2018-07-01 DIAGNOSIS — M25519 Pain in unspecified shoulder: Secondary | ICD-10-CM | POA: Diagnosis not present

## 2018-07-01 MED ORDER — SERTRALINE HCL 25 MG PO TABS: 12.5000 mg | ORAL_TABLET | Freq: Every day | ORAL | Status: DC | PRN

## 2018-07-01 MED ORDER — OMEPRAZOLE 20 MG PO CPDR: 20.0000 mg | DELAYED_RELEASE_CAPSULE | Freq: Two times a day (BID) | ORAL | Status: DC | PRN

## 2018-07-01 NOTE — Progress Notes (Signed)
I was not involved in the planning of the patient's surgery until I got a non-emergent fax at the end of last week asking for the patient to be optimized for surgery this week.  Discussed with patient.  Per patient report, he was called on Friday and told that he had an abnormal EKG and needed follow-up.  He has significant left shoulder pain and the plan is for a left shoulder arthroscopy with debridement.  He has pain on range of motion.  No CP, SOB.  He has L shoulder pain with ROM but not with exertion.  No BLE edema.  No lightheaded.  He did ~4 miles on exercise bike this AM in about 20 minutes.  No SOB, no CP with that.  He can dig post holes without CP.   No h/o CVA, MI, stent, etc.    Recent and previous EKGs discussed with patient.  He has already stopped taking aspirin in anticipation of surgery.  PMH and SH reviewed  ROS: Per HPI unless specifically indicated in ROS section   Meds, vitals, and allergies reviewed.   GEN: nad, alert and oriented HEENT: mucous membranes moist NECK: supple w/o LA CV: rrr. PULM: ctab, no inc wob ABD: soft, +bs EXT: no edema SKIN: well perfused.

## 2018-07-01 NOTE — Telephone Encounter (Signed)
Patient has appointment today that was made prior to my getting this note.

## 2018-07-01 NOTE — Patient Instructions (Signed)
You have no emergent cardiac symptoms.   You have some possibly insignificant EKG changes.   I want to talk to Dr. Rockey Situ.  We'll go from there.  Take care.  Glad to see you.

## 2018-07-01 NOTE — Telephone Encounter (Signed)
Thanks

## 2018-07-01 NOTE — Telephone Encounter (Addendum)
Notify patient.  He needs office visit prior to surgery.  I cannot sign off on it without seeing him here in the clinic.  A non-urgent fax (without any other form of contact) was sent to the office on Thursday February 6 or Friday February 7.  I cannot tell from the fax, since it has both dates listed.  They attached an abnormal EKG with a note to a "please optimize for surgery."  I was not involved in the planning of his surgery up until this point.  He may end up needing to see cardiology.  If he wants to go straight to cardiology that is okay with me.

## 2018-07-02 ENCOUNTER — Encounter: Payer: Self-pay | Admitting: Family Medicine

## 2018-07-02 DIAGNOSIS — M25519 Pain in unspecified shoulder: Secondary | ICD-10-CM | POA: Insufficient documentation

## 2018-07-02 NOTE — Pre-Procedure Instructions (Signed)
CLEARANCE ON CHART 

## 2018-07-02 NOTE — Assessment & Plan Note (Signed)
Discussed options.  I thought that he could have possibly insignificant EKG changes.  He clearly has good exertional capacity without any chest pain.  The shoulder pain that he has is attributed to his orthopedic condition which should be addressable with surgery.  There is no thought that his shoulder pain is cardiac related.  I called and talked with Dr. Rockey Situ with cardiology.  He graciously reviewed the patient's situation and recent/old EKGs.  He did not think that there was a significant change on the EKGs.  I told him about the patient's exertional capacity and he thought it was reasonable to proceed with surgery.  I also think this is reasonable.  Based on available data and history and physical exam it appears that the patient is appropriately low risk to proceed with surgery.  Patient and I talked about the risk versus benefit of surgery at the office visit.  >25 minutes spent in face to face time with patient, >50% spent in counselling or coordination of care.

## 2018-07-03 MED ORDER — CEFAZOLIN SODIUM-DEXTROSE 2-4 GM/100ML-% IV SOLN
2.0000 g | Freq: Once | INTRAVENOUS | Status: AC
  Administered 2018-07-04: 2 g via INTRAVENOUS

## 2018-07-04 ENCOUNTER — Other Ambulatory Visit: Payer: Self-pay

## 2018-07-04 ENCOUNTER — Encounter: Payer: Self-pay | Admitting: *Deleted

## 2018-07-04 ENCOUNTER — Ambulatory Visit: Payer: PPO | Admitting: Anesthesiology

## 2018-07-04 ENCOUNTER — Ambulatory Visit
Admission: RE | Disposition: A | Discharge status: Home / Self Care | Payer: Self-pay | Source: Home / Self Care | Attending: Surgery

## 2018-07-04 ENCOUNTER — Ambulatory Visit
Admission: RE | Admit: 2018-07-04 | Discharge: 2018-07-04 | Disposition: A | Discharge status: Home / Self Care | Payer: PPO | Attending: Surgery | Admitting: Surgery

## 2018-07-04 DIAGNOSIS — Z87891 Personal history of nicotine dependence: Secondary | ICD-10-CM | POA: Insufficient documentation

## 2018-07-04 DIAGNOSIS — M7542 Impingement syndrome of left shoulder: Secondary | ICD-10-CM | POA: Diagnosis not present

## 2018-07-04 DIAGNOSIS — I129 Hypertensive chronic kidney disease with stage 1 through stage 4 chronic kidney disease, or unspecified chronic kidney disease: Secondary | ICD-10-CM | POA: Diagnosis not present

## 2018-07-04 DIAGNOSIS — Z79899 Other long term (current) drug therapy: Secondary | ICD-10-CM | POA: Diagnosis not present

## 2018-07-04 DIAGNOSIS — Z8249 Family history of ischemic heart disease and other diseases of the circulatory system: Secondary | ICD-10-CM | POA: Insufficient documentation

## 2018-07-04 DIAGNOSIS — Z833 Family history of diabetes mellitus: Secondary | ICD-10-CM | POA: Diagnosis not present

## 2018-07-04 DIAGNOSIS — M75112 Incomplete rotator cuff tear or rupture of left shoulder, not specified as traumatic: Secondary | ICD-10-CM | POA: Diagnosis not present

## 2018-07-04 DIAGNOSIS — M1990 Unspecified osteoarthritis, unspecified site: Secondary | ICD-10-CM | POA: Diagnosis not present

## 2018-07-04 DIAGNOSIS — M7582 Other shoulder lesions, left shoulder: Secondary | ICD-10-CM | POA: Insufficient documentation

## 2018-07-04 DIAGNOSIS — Z87442 Personal history of urinary calculi: Secondary | ICD-10-CM | POA: Diagnosis not present

## 2018-07-04 DIAGNOSIS — K219 Gastro-esophageal reflux disease without esophagitis: Secondary | ICD-10-CM | POA: Insufficient documentation

## 2018-07-04 DIAGNOSIS — F329 Major depressive disorder, single episode, unspecified: Secondary | ICD-10-CM | POA: Insufficient documentation

## 2018-07-04 DIAGNOSIS — K449 Diaphragmatic hernia without obstruction or gangrene: Secondary | ICD-10-CM | POA: Insufficient documentation

## 2018-07-04 DIAGNOSIS — G8918 Other acute postprocedural pain: Secondary | ICD-10-CM | POA: Diagnosis not present

## 2018-07-04 DIAGNOSIS — N189 Chronic kidney disease, unspecified: Secondary | ICD-10-CM | POA: Insufficient documentation

## 2018-07-04 DIAGNOSIS — M24112 Other articular cartilage disorders, left shoulder: Secondary | ICD-10-CM | POA: Diagnosis not present

## 2018-07-04 DIAGNOSIS — F419 Anxiety disorder, unspecified: Secondary | ICD-10-CM | POA: Insufficient documentation

## 2018-07-04 DIAGNOSIS — M7522 Bicipital tendinitis, left shoulder: Secondary | ICD-10-CM | POA: Diagnosis not present

## 2018-07-04 DIAGNOSIS — Z888 Allergy status to other drugs, medicaments and biological substances status: Secondary | ICD-10-CM | POA: Diagnosis not present

## 2018-07-04 DIAGNOSIS — I10 Essential (primary) hypertension: Secondary | ICD-10-CM | POA: Diagnosis not present

## 2018-07-04 DIAGNOSIS — Z96651 Presence of right artificial knee joint: Secondary | ICD-10-CM | POA: Insufficient documentation

## 2018-07-04 DIAGNOSIS — M25512 Pain in left shoulder: Secondary | ICD-10-CM | POA: Diagnosis not present

## 2018-07-04 DIAGNOSIS — Z7982 Long term (current) use of aspirin: Secondary | ICD-10-CM | POA: Insufficient documentation

## 2018-07-04 DIAGNOSIS — F418 Other specified anxiety disorders: Secondary | ICD-10-CM | POA: Diagnosis not present

## 2018-07-04 DIAGNOSIS — E785 Hyperlipidemia, unspecified: Secondary | ICD-10-CM | POA: Diagnosis not present

## 2018-07-04 HISTORY — PX: SHOULDER ARTHROSCOPY WITH SUBACROMIAL DECOMPRESSION AND BICEP TENDON REPAIR: SHX5689

## 2018-07-04 SURGERY — SHOULDER ARTHROSCOPY WITH SUBACROMIAL DECOMPRESSION AND BICEP TENDON REPAIR
Anesthesia: Choice | Laterality: Left

## 2018-07-04 MED ORDER — PROPOFOL 10 MG/ML IV BOLUS
INTRAVENOUS | Status: DC | PRN
  Administered 2018-07-04: 160 mg via INTRAVENOUS

## 2018-07-04 MED ORDER — PHENYLEPHRINE HCL-NACL 10-0.9 MG/250ML-% IV SOLN
INTRAVENOUS | Status: DC | PRN
  Administered 2018-07-04: 10 ug/min via INTRAVENOUS

## 2018-07-04 MED ORDER — FENTANYL CITRATE (PF) 100 MCG/2ML IJ SOLN
INTRAMUSCULAR | Status: DC | PRN
  Administered 2018-07-04: 25 ug via INTRAVENOUS
  Administered 2018-07-04: 50 ug via INTRAVENOUS

## 2018-07-04 MED ORDER — METOCLOPRAMIDE HCL 10 MG PO TABS: 5.0000 mg | ORAL_TABLET | Freq: Three times a day (TID) | ORAL | Status: DC | PRN

## 2018-07-04 MED ORDER — OXYCODONE HCL 5 MG PO TABS: 5.0000 mg | ORAL_TABLET | ORAL | 0 refills | Status: DC | PRN

## 2018-07-04 MED ORDER — BUPIVACAINE LIPOSOME 1.3 % IJ SUSP
INTRAMUSCULAR | Status: DC | PRN
  Administered 2018-07-04: 20 mL
  Administered 2018-07-04: 20 mL via PERINEURAL

## 2018-07-04 MED ORDER — LACTATED RINGERS IV SOLN
INTRAVENOUS | Status: DC | PRN
  Administered 2018-07-04: 1 mL

## 2018-07-04 MED ORDER — BUPIVACAINE HCL (PF) 0.5 % IJ SOLN
INTRAMUSCULAR | Status: DC | PRN
  Administered 2018-07-04: 10 mL via PERINEURAL

## 2018-07-04 MED ORDER — LIDOCAINE HCL (PF) 1 % IJ SOLN
INTRAMUSCULAR | Status: AC
  Filled 2018-07-04: qty 5

## 2018-07-04 MED ORDER — ONDANSETRON HCL 4 MG PO TABS: 4.0000 mg | ORAL_TABLET | Freq: Four times a day (QID) | ORAL | Status: DC | PRN

## 2018-07-04 MED ORDER — FENTANYL CITRATE (PF) 100 MCG/2ML IJ SOLN: 25.0000 ug | INTRAMUSCULAR | Status: DC | PRN

## 2018-07-04 MED ORDER — ONDANSETRON HCL 4 MG/2ML IJ SOLN: 4.0000 mg | Freq: Once | INTRAMUSCULAR | Status: DC | PRN

## 2018-07-04 MED ORDER — BUPIVACAINE LIPOSOME 1.3 % IJ SUSP
INTRAMUSCULAR | Status: AC
  Filled 2018-07-04: qty 20

## 2018-07-04 MED ORDER — FENTANYL CITRATE (PF) 100 MCG/2ML IJ SOLN
INTRAMUSCULAR | Status: AC
  Administered 2018-07-04: 25 ug via INTRAVENOUS
  Filled 2018-07-04: qty 2

## 2018-07-04 MED ORDER — GLYCOPYRROLATE 0.2 MG/ML IJ SOLN
INTRAMUSCULAR | Status: AC
  Filled 2018-07-04: qty 1

## 2018-07-04 MED ORDER — CEFAZOLIN SODIUM-DEXTROSE 2-4 GM/100ML-% IV SOLN
INTRAVENOUS | Status: AC
  Filled 2018-07-04: qty 100

## 2018-07-04 MED ORDER — LIDOCAINE HCL (PF) 4 % IJ SOLN
INTRAMUSCULAR | Status: DC | PRN
  Administered 2018-07-04: 4 mL via RESPIRATORY_TRACT

## 2018-07-04 MED ORDER — LACTATED RINGERS IV SOLN
INTRAVENOUS | Status: DC
  Administered 2018-07-04: 08:00:00 via INTRAVENOUS

## 2018-07-04 MED ORDER — MIDAZOLAM HCL 2 MG/2ML IJ SOLN
2.0000 mg | Freq: Once | INTRAMUSCULAR | Status: AC
  Administered 2018-07-04: 2 mg via INTRAVENOUS

## 2018-07-04 MED ORDER — FENTANYL CITRATE (PF) 100 MCG/2ML IJ SOLN
25.0000 ug | Freq: Once | INTRAMUSCULAR | Status: AC
  Administered 2018-07-04: 25 ug via INTRAVENOUS

## 2018-07-04 MED ORDER — METOCLOPRAMIDE HCL 5 MG/ML IJ SOLN: 5.0000 mg | Freq: Three times a day (TID) | INTRAMUSCULAR | Status: DC | PRN

## 2018-07-04 MED ORDER — FENTANYL CITRATE (PF) 250 MCG/5ML IJ SOLN
INTRAMUSCULAR | Status: AC
  Filled 2018-07-04: qty 5

## 2018-07-04 MED ORDER — ONDANSETRON HCL 4 MG/2ML IJ SOLN: 4.0000 mg | Freq: Four times a day (QID) | INTRAMUSCULAR | Status: DC | PRN

## 2018-07-04 MED ORDER — MIDAZOLAM HCL 2 MG/2ML IJ SOLN
INTRAMUSCULAR | Status: AC
  Administered 2018-07-04: 2 mg via INTRAVENOUS
  Filled 2018-07-04: qty 2

## 2018-07-04 MED ORDER — PHENYLEPHRINE HCL 10 MG/ML IJ SOLN
INTRAMUSCULAR | Status: DC | PRN
  Administered 2018-07-04 (×2): 100 ug via INTRAVENOUS

## 2018-07-04 MED ORDER — ROCURONIUM BROMIDE 100 MG/10ML IV SOLN
INTRAVENOUS | Status: DC | PRN
  Administered 2018-07-04: 10 mg via INTRAVENOUS
  Administered 2018-07-04: 80 mg via INTRAVENOUS

## 2018-07-04 MED ORDER — ROCURONIUM BROMIDE 50 MG/5ML IV SOLN
INTRAVENOUS | Status: AC
  Filled 2018-07-04: qty 2

## 2018-07-04 MED ORDER — LIDOCAINE HCL (PF) 2 % IJ SOLN
INTRAMUSCULAR | Status: AC
  Filled 2018-07-04: qty 20

## 2018-07-04 MED ORDER — GLYCOPYRROLATE 0.2 MG/ML IJ SOLN
INTRAMUSCULAR | Status: DC | PRN
  Administered 2018-07-04: 0.2 mg via INTRAVENOUS

## 2018-07-04 MED ORDER — ONDANSETRON HCL 4 MG/2ML IJ SOLN
INTRAMUSCULAR | Status: DC | PRN
  Administered 2018-07-04: 4 mg via INTRAVENOUS

## 2018-07-04 MED ORDER — PROPOFOL 10 MG/ML IV BOLUS
INTRAVENOUS | Status: AC
  Filled 2018-07-04: qty 20

## 2018-07-04 MED ORDER — BUPIVACAINE-EPINEPHRINE 0.5% -1:200000 IJ SOLN
INTRAMUSCULAR | Status: DC | PRN
  Administered 2018-07-04: 30 mL

## 2018-07-04 MED ORDER — PHENYLEPHRINE HCL 10 MG/ML IJ SOLN
INTRAMUSCULAR | Status: AC
  Filled 2018-07-04: qty 2

## 2018-07-04 MED ORDER — SUGAMMADEX SODIUM 200 MG/2ML IV SOLN
INTRAVENOUS | Status: DC | PRN
  Administered 2018-07-04: 200 mg via INTRAVENOUS

## 2018-07-04 MED ORDER — OXYCODONE HCL 5 MG PO TABS: 5.0000 mg | ORAL_TABLET | ORAL | Status: DC | PRN

## 2018-07-04 MED ORDER — BUPIVACAINE HCL (PF) 0.5 % IJ SOLN
INTRAMUSCULAR | Status: AC
  Filled 2018-07-04: qty 10

## 2018-07-04 MED ORDER — DEXAMETHASONE SODIUM PHOSPHATE 4 MG/ML IJ SOLN
INTRAMUSCULAR | Status: DC | PRN
  Administered 2018-07-04: 6 mg via INTRAVENOUS

## 2018-07-04 MED ORDER — LIDOCAINE HCL (CARDIAC) PF 100 MG/5ML IV SOSY
PREFILLED_SYRINGE | INTRAVENOUS | Status: DC | PRN
  Administered 2018-07-04: 100 mg via INTRAVENOUS

## 2018-07-04 MED ORDER — POTASSIUM CHLORIDE IN NACL 20-0.9 MEQ/L-% IV SOLN: INTRAVENOUS | Status: DC

## 2018-07-04 SURGICAL SUPPLY — 50 items
ANCH SUT 2 2.9 2 LD TPR NDL (Anchor) ×1 IMPLANT
ANCH SUT BN ASCP DLV (Anchor) ×1 IMPLANT
ANCH SUT RGNRT REGENETEN (Staple) ×1 IMPLANT
ANCHOR BONE REGENETEN (Anchor) ×2 IMPLANT
ANCHOR JUGGERKNOT WTAP NDL 2.9 (Anchor) ×2 IMPLANT
ANCHOR TENDON REGENETEN (Staple) ×2 IMPLANT
BIT DRILL JUGRKNT W/NDL BIT2.9 (DRILL) IMPLANT
BLADE FULL RADIUS 3.5 (BLADE) ×3 IMPLANT
BUR ACROMIONIZER 4.0 (BURR) ×3 IMPLANT
CANNULA SHAVER 8MMX76MM (CANNULA) ×3 IMPLANT
CHLORAPREP W/TINT 26ML (MISCELLANEOUS) ×5 IMPLANT
COVER MAYO STAND STRL (DRAPES) ×3 IMPLANT
COVER WAND RF STERILE (DRAPES) ×3 IMPLANT
DRAPE IMP U-DRAPE 54X76 (DRAPES) ×6 IMPLANT
DRILL JUGGERKNOT W/NDL BIT 2.9 (DRILL) ×3
ELECT REM PT RETURN 9FT ADLT (ELECTROSURGICAL) ×3
ELECTRODE REM PT RTRN 9FT ADLT (ELECTROSURGICAL) ×1 IMPLANT
GAUZE PETRO XEROFOAM 1X8 (MISCELLANEOUS) ×3 IMPLANT
GAUZE SPONGE 4X4 12PLY STRL (GAUZE/BANDAGES/DRESSINGS) ×3 IMPLANT
GLOVE BIO SURGEON STRL SZ7.5 (GLOVE) ×6 IMPLANT
GLOVE BIO SURGEON STRL SZ8 (GLOVE) ×6 IMPLANT
GLOVE BIOGEL PI IND STRL 8 (GLOVE) ×1 IMPLANT
GLOVE BIOGEL PI INDICATOR 8 (GLOVE) ×2
GLOVE INDICATOR 8.0 STRL GRN (GLOVE) ×3 IMPLANT
GOWN STRL REUS W/ TWL LRG LVL3 (GOWN DISPOSABLE) ×1 IMPLANT
GOWN STRL REUS W/ TWL XL LVL3 (GOWN DISPOSABLE) ×1 IMPLANT
GOWN STRL REUS W/TWL LRG LVL3 (GOWN DISPOSABLE) ×6
GOWN STRL REUS W/TWL XL LVL3 (GOWN DISPOSABLE) ×3
GRASPER SUT 15 45D LOW PRO (SUTURE) IMPLANT
IMPL REGENETEN MEDIUM (Shoulder) IMPLANT
IMPLANT REGENETEN MEDIUM (Shoulder) ×3 IMPLANT
IV LACTATED RINGER IRRG 3000ML (IV SOLUTION) ×6
IV LR IRRIG 3000ML ARTHROMATIC (IV SOLUTION) ×2 IMPLANT
MANIFOLD NEPTUNE II (INSTRUMENTS) ×3 IMPLANT
MASK FACE SPIDER DISP (MASK) ×3 IMPLANT
MAT ABSORB  FLUID 56X50 GRAY (MISCELLANEOUS) ×2
MAT ABSORB FLUID 56X50 GRAY (MISCELLANEOUS) ×1 IMPLANT
PACK ARTHROSCOPY SHOULDER (MISCELLANEOUS) ×3 IMPLANT
SLING ARM LRG DEEP (SOFTGOODS) ×1 IMPLANT
SLING ULTRA II LG (MISCELLANEOUS) ×3 IMPLANT
STAPLER SKIN PROX 35W (STAPLE) ×3 IMPLANT
STRAP SAFETY 5IN WIDE (MISCELLANEOUS) ×3 IMPLANT
SUT ETHIBOND 0 MO6 C/R (SUTURE) ×3 IMPLANT
SUT VIC AB 2-0 CT1 27 (SUTURE) ×6
SUT VIC AB 2-0 CT1 TAPERPNT 27 (SUTURE) ×2 IMPLANT
TAPE MICROFOAM 4IN (TAPE) ×3 IMPLANT
TUBING ARTHRO INFLOW-ONLY STRL (TUBING) ×3 IMPLANT
TUBING CONNECTING 10 (TUBING) ×2 IMPLANT
TUBING CONNECTING 10' (TUBING) ×1
WAND WEREWOLF FLOW 90D (MISCELLANEOUS) ×3 IMPLANT

## 2018-07-04 NOTE — Discharge Instructions (Addendum)
Orthopedic discharge instructions: Keep dressing dry and intact.  May shower after dressing changed on post-op day #4 (Monday).  Cover staples with Band-Aids after drying off. Apply ice frequently to shoulder. Take oxycodone as prescribed when needed.  May supplement with ES Tylenol and/or Tramadol if necessary. Keep shoulder immobilizer on at all times except may remove for bathing purposes. Follow-up in 10-14 days or as scheduled.   Shoulder Arthroscopy, Care After This sheet gives you information about how to care for yourself after your procedure. Your health care provider may also give you more specific instructions. If you have problems or questions, contact your health care provider. What can I expect after the procedure? After the procedure, it is common to have:  Pain that can be relieved by taking pain medicine.  Swelling.  A small amount of fluid from the incision.  Stiffness that improves over time. Follow these instructions at home: If you have a sling or immobilizer:  Wear the sling or immobilizer as told by your health care provider. Remove it only as told by your health care provider. These devices protect your shoulder and help it heal by keeping it in place.  Loosen the sling or immobilizer if your fingers tingle, become numb, or turn cold and blue.  Keep the sling or immobilizer clean.  Ask if you may remove the sling or immobilizer for bathing. If you need to keep it on while bathing and it is not waterproof: ? Do not let it get wet. ? Cover it with a watertight covering when you take a bath or a shower. Incision care   Follow instructions from your health care provider about how to take care of your incisions. Make sure you: ? Wash your hands with soap and water before you change your bandage (dressing). If soap and water are not available, use hand sanitizer. ? Change your dressing as told by your health care provider. ? Leave stitches (sutures),  staples, skin glue, or adhesive strips in place. These skin closures may need to stay in place for 2 weeks or longer. If adhesive strip edges start to loosen and curl up, you may trim the loose edges. Do not remove adhesive strips completely unless your health care provider tells you to do that.  Check your incision areas every day for signs of infection. Check for: ? Redness ? More swelling or pain. ? Blood or more fluid. ? Warmth. ? Pus or a bad smell. Bathing  Do not take baths, swim, or use a hot tub until your health care provider approves. Ask your health care provider if you may take showers. You may only be allowed to take sponge baths. Activity  Ask your health care provider what activities are safe for you during recovery, and ask what activities you need to avoid.  Do not lift with your affected shoulder until your health care provider approves.  Avoid pulling and pushing with the arm on your affected side.  If physical therapy was prescribed, do exercises as directed. Doing exercises may help to improve shoulder movement and flexibility (range of motion). Driving  Do not drive until your health care provider approves.  Do not drive or use heavy machinery while taking prescription pain medicine. Managing pain, stiffness, and swelling   If lying down flat causes shoulder discomfort, it may help to sleep in a sitting position for a few days after your procedure. Try sleeping in a reclining chair or propping yourself up with extra pillows in bed.  If directed, put ice on the affected area: ? Put ice in a plastic bag or use the icing device (cold therapy unit) that you were given. Follow instructions from your health care provider about how to use the icing device. ? Place a towel between your skin and the bag or between your skin and the icing device. ? Leave the ice on for 20 minutes, 2-3 times a day.  Move your fingers often to avoid stiffness and to lessen  swelling. General instructions  Take over-the-counter and prescription medicines only as told by your health care provider.  If you are taking prescription pain medicine, take actions to prevent or treat constipation. Your health care provider may recommend that you: ? Drink enough fluid to keep your urine pale yellow. ? Eat foods that are high in fiber, such as fresh fruits and vegetables, whole grains, and beans. ? Limit foods that are high in fat and processed sugars, such as fried or sweet foods. ? Take an over-the-counter or prescription medicine for constipation.  Do not use any products that contain nicotine or tobacco, such as cigarettes and e-cigarettes. These can delay incision or bone healing. If you need help quitting, ask your health care provider.  Keep all follow-up visits as told by your health care provider. This is important. Contact a health care provider if you:  Have a fever.  Have severe pain.  Have redness around an incision.  Have more swelling or pain in an incision area.  Have blood or more fluid coming from an incision.  Notice that an incision feels warm to the touch.  Notice pus or a bad smell coming from an incision.  Notice that an incision has opened up.  Develop a rash. Get help right away if you:  Have difficulty breathing.  Have chest pain.  Notice that your fingers tingle, are numb, or are cold and blue even after you loosen your sling or immobilizer.  Develop pain in your lower leg or at the back of your knee. Summary  If you have a sling or immobilizer, wear it as told by your health care provider. These devices protect your shoulder and help it heal by keeping it in place.  If lying down flat causes shoulder discomfort, it may help to sleep in a sitting position for a few days after your procedure. Try sleeping in a reclining chair, or try propping yourself up with extra pillows in bed.  If physical therapy was prescribed, do  exercises as directed. Doing exercises may help to improve shoulder movement and flexibility (range of motion).  Keep all follow-up visits as told by your health care provider. This is important. This information is not intended to replace advice given to you by your health care provider. Make sure you discuss any questions you have with your health care provider. Document Released: 12/03/2013 Document Revised: 03/23/2017 Document Reviewed: 03/23/2017 Elsevier Interactive Patient Education  2019 [employer]   1) The drugs that you were given will stay in your system until tomorrow so for the next 24 hours you should not:  A) Drive an automobile B) Make any legal decisions C) Drink any alcoholic beverage   2) You may resume regular meals tomorrow.  Today it is better to start with liquids and gradually work up to solid foods.  You may eat anything you prefer, but it is better to start with liquids, then soup and crackers, and gradually work up to  solid foods.   3) Please notify your doctor immediately if you have any unusual bleeding, trouble breathing, redness and pain at the surgery site, drainage, fever, or pain not relieved by medication.    4) Additional Instructions:        Please contact your physician with any problems or Same Day Surgery at [phone], Monday through Friday 6 am to 4 pm, or [place] at [facility] number at [phone].

## 2018-07-04 NOTE — Anesthesia Post-op Follow-up Note (Signed)
Anesthesia QCDR form completed.        

## 2018-07-04 NOTE — Anesthesia Postprocedure Evaluation (Signed)
Anesthesia Post Note  Patient: David Mcgee  Procedure(s) Performed: SHOULDER ARTHROSCOPY WITH DEBRIDEMENT, DECOMPRESSION, REPAIR OF PARTIAL THICKNESS ROTATOR CUFF TEAR, BICEP TENODESIS WITH POSSIBLE DISTAL CLAVICLE EXCISION (Left )  Patient location during evaluation: PACU Anesthesia Type: General Level of consciousness: awake and alert Pain management: pain level controlled Vital Signs Assessment: post-procedure vital signs reviewed and stable Respiratory status: spontaneous breathing, nonlabored ventilation, respiratory function stable and patient connected to nasal cannula oxygen Cardiovascular status: blood pressure returned to baseline and stable Postop Assessment: no apparent nausea or vomiting Anesthetic complications: no     Last Vitals:  Vitals:   07/04/18 1155 07/04/18 1250  BP: (!) 143/86 (!) 149/87  Pulse: 68 70  Resp: 16 18  Temp: (!) 36.4 C (!) 36.2 C  SpO2: 97% 98%    Last Pain:  Vitals:   07/04/18 1250  TempSrc: Temporal  PainSc: 0-No pain                 Molli Barrows

## 2018-07-04 NOTE — Anesthesia Procedure Notes (Signed)
Anesthesia Regional Block: Interscalene brachial plexus block   Pre-Anesthetic Checklist: ,, timeout performed, Correct Patient, Correct Site, Correct Laterality, Correct Procedure, Correct Position, site marked, Risks and benefits discussed,  Surgical consent,  Pre-op evaluation,  At surgeon's request and post-op pain management  Laterality: Left  Prep: chloraprep       Needles:  Injection technique: Single-shot  Needle Type: Echogenic Stimulator Needle     Needle Length: 10cm  Needle Gauge: 20     Additional Needles:   Procedures:, nerve stimulator,,, ultrasound used (permanent image in chart),,,,   Nerve Stimulator or Paresthesia:  Response: biceps flexion,   Additional Responses:   Narrative:  Injection made incrementally with aspirations every 5 mL.  Performed by: Personally   Additional Notes: Functioning IV was confirmed and monitors were applied.  . Sterile prep and drape,hand hygiene and sterile gloves were used.  Negative aspiration and negative test dose prior to incremental administration of local anesthetic. The patient tolerated the procedure well.        

## 2018-07-04 NOTE — Transfer of Care (Signed)
Immediate Anesthesia Transfer of Care Note  Patient: David Mcgee  Procedure(s) Performed: SHOULDER ARTHROSCOPY WITH DEBRIDEMENT, DECOMPRESSION, REPAIR OF PARTIAL THICKNESS ROTATOR CUFF TEAR, BICEP TENODESIS WITH POSSIBLE DISTAL CLAVICLE EXCISION (Left )  Patient Location: PACU  Anesthesia Type:General  Level of Consciousness: awake, alert , oriented and patient cooperative  Airway & Oxygen Therapy: Patient Spontanous Breathing and Patient connected to nasal cannula oxygen  Post-op Assessment: Report given to RN and Post -op Vital signs reviewed and stable  Post vital signs: Reviewed and stable  Last Vitals:  Vitals Value Taken Time  BP    Temp    Pulse    Resp    SpO2      Last Pain:  Vitals:   07/04/18 0837  TempSrc:   PainSc: 0-No pain         Complications: No apparent anesthesia complications

## 2018-07-04 NOTE — Anesthesia Preprocedure Evaluation (Addendum)
Anesthesia Evaluation  Patient identified by MRN, date of birth, ID band Patient awake    Reviewed: Allergy & Precautions, H&P , NPO status , Patient's Chart, lab work & pertinent test results, reviewed documented beta blocker date and time   Airway Mallampati: II  TM Distance: >3 FB Neck ROM: full    Dental  (+) Teeth Intact   Pulmonary neg pulmonary ROS, former smoker,    Pulmonary exam normal        Cardiovascular Exercise Tolerance: Good hypertension, On Medications negative cardio ROS Normal cardiovascular exam Rhythm:regular Rate:Normal     Neuro/Psych PSYCHIATRIC DISORDERS Anxiety Depression negative neurological ROS  negative psych ROS   GI/Hepatic negative GI ROS, Neg liver ROS, hiatal hernia, GERD  ,  Endo/Other  negative endocrine ROS  Renal/GU Renal diseasenegative Renal ROS  negative genitourinary   Musculoskeletal   Abdominal   Peds  Hematology negative hematology ROS (+)   Anesthesia Other Findings Past Medical History: No date: Arthritis     Comment:  back, knees, hands - OA No date: Cancer ([place])     Comment:  Skin, not melanoma No date: Chronic kidney disease     Comment:  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 Epifanio Lesches) No date: Hernoiorraphy     Comment:  Right; years ago No date: JOINT  REPLACEMENT; Bilateral 08/18/2015: KNEE ARTHROPLASTY; Right     Comment:  Procedure: COMPUTER ASSISTED TOTAL KNEE ARTHROPLASTY;                Surgeon: Dereck Leep, MD;  Location: [facility] ORS;                Service: Orthopedics;  Laterality: Right; 10/2000: KNEE ARTHROSCOPY     Comment:  Right Knee (Dr. Sabra Heck) 12/09/2012: LUMBAR LAMINECTOMY/DECOMPRESSION MICRODISCECTOMY; Right     Comment:  Procedure: LUMBAR LAMINECTOMY/DECOMPRESSION               MICRODISCECTOMY 1 LEVEL;  Surgeon: Elaina Hoops, MD;                Location: [facility];  Service: Neurosurgery;                Laterality: Right;  LUMBAR LAMINECTOMY/DECOMPRESSION               MICRODISCECTOMY 1 LEVEL 01/26/2017: MOHS SURGERY     Comment:  nose 2000: NEUROMA SURGERY; Right     Comment:  Right foot Morton's neuroma excision No date: TONSILLECTOMY 2013, 2016: TOTAL KNEE ARTHROPLASTY; Bilateral BMI    Body Mass Index:  30.61 kg/m     Reproductive/Obstetrics negative OB ROS  Anesthesia Physical Anesthesia Plan  ASA: III  Anesthesia Plan: General ETT   Post-op Pain Management:  Regional for Post-op pain   Induction:   PONV Risk Score and Plan:   Airway Management Planned:   Additional Equipment:   Intra-op Plan:   Post-operative Plan:   Informed Consent: I have reviewed the patients History and Physical, chart, labs and discussed the procedure including the risks, benefits and alternatives for the proposed anesthesia with the patient or authorized representative who has indicated his/her understanding and acceptance.     Dental Advisory Given  Plan Discussed with: CRNA  Anesthesia Plan Comments:        Anesthesia Quick Evaluation

## 2018-07-04 NOTE — Op Note (Signed)
07/04/2018  10:43 AM  Patient:   David Mcgee  Pre-Op Diagnosis:   Impingement/tendinopathy with partial-thickness rotator cuff tear and biceps tendinopathy, left shoulder.  Post-Op Diagnosis:   Impingement/tendinopathy with extensive near full-thickness bursal surface rotator cuff tears, degenerative labral fraying, and biceps tendinopathy, left shoulder.  Procedure:   Limited arthroscopic debridement, arthroscopic subacromial decompression, mini-open rotator cuff repair using Venturi & Nephew Regeneten patch, and mini-open biceps tenodesis, left shoulder.  Anesthesia:   General endotracheal with interscalene block using Exparel placed preoperatively by the anesthesiologist.  Surgeon:   Pascal Lux, MD  Assistant:   Cameron Proud, PA-C  Findings:   As above. There was moderate fraying of the labrum anteriorly and superiorly without frank detachment from the glenoid rim. There was moderate "lip sticking" of the biceps tendon without partial or full-thickness tearing. There was extensive near full-thickness bursal surface tearing of the entire supraspinatus tendon with extension into the infraspinatus tendon. The remainder of the rotator cuff was in satisfactory condition. The articular surfaces of the glenoid and humerus both were in satisfactory condition.  Complications:   None  Fluids:   900 cc  Estimated blood loss:   15 cc  Tourniquet time:   None  Drains:   None  Closure:   Staples      Brief clinical note:   The patient is a 73 year old male with a long history of progressively worsening left shoulder pain. The patient's symptoms have progressed despite medications, activity modification, etc. The patient's history and examination are consistent with impingement/tendinopathy with a possible rotator cuff tear. The preoperative MRI scan demonstrated evidence of significant tendinopathy of the supraspinatus tendon without obvious full-thickness tearing. The patient presents at  this time for definitive management of his shoulder symptoms.  Procedure:   The patient underwent placement of an interscalene block using Exparel by the anesthesiologist in the preoperative holding area before being brought into the operating room and lain in the supine position. The patient then underwent general endotracheal intubation and anesthesia before being repositioned in the beach chair position using the beach chair positioner. The left shoulder and upper extremity were prepped with ChloraPrep solution before being draped sterilely. Preoperative antibiotics were administered. A timeout was performed to confirm the proper surgical site before the expected portal sites and incision site were injected with 0.5% Sensorcaine with epinephrine. A posterior portal was created and the glenohumeral joint thoroughly inspected with the findings as described above. An anterior portal was created using an outside-in technique. The labrum and rotator cuff were further probed, again confirming the above-noted findings. Areas of degenerative labral fraying were debrided back to stable margins, as was a small area of articular surface fraying of the mid-insertional fibers of the infraspinatus tendon. The ArthroCare wand was inserted and used to release the biceps from its labral anchor. It also was used to obtain hemostasis as well as to "anneal" the labrum superiorly and anteriorly. The instruments were removed from the joint after suctioning the excess fluid.  The camera was repositioned through the posterior portal into the subacromial space. A separate lateral portal was created using an outside-in technique. The 3.5 mm full-radius resector was introduced and used to perform a subtotal bursectomy. The ArthroCare wand was then inserted and used to remove the periosteal tissue off the undersurface of the anterior third of the acromion as well as to recess the coracoacromial ligament from its attachment along the  anterior and lateral margins of the acromion. The 4.0 mm acromionizing  bur was introduced and used to complete the decompression by removing the undersurface of the anterior third of the acromion. The full radius resector was reintroduced to remove any residual bony debris before the ArthroCare wand was reintroduced to obtain hemostasis. The instruments were then removed from the subacromial space after suctioning the excess fluid.  An approximately 4-5 cm incision was made over the anterolateral aspect of the shoulder beginning at the anterolateral corner of the acromion and extending distally in line with the bicipital groove. This incision was carried down through the subcutaneous tissues to expose the deltoid fascia. The raphae between the anterior and middle thirds was identified and this plane developed to provide access into the subacromial space. Additional bursal tissues were debrided sharply using Metzenbaum scissors. The rotator cuff tears were readily identified.  In addition to extensive degenerative fraying of the bursal surface of the supraspinatus tendon, there were two longitudinal near full-thickness tears of the rotator cuff. The first was near the anterior border of the supraspinatus, whereas the more posterior tear appear to occur at the junction of the supraspinatus and infraspinatus tendons. The margins of these tears were debrided sharply with a #15 blade. The tear was repaired in a side-to-side fashion using multiple #0 Ethibond interrupted sutures. The area of bursal surface tearing was then covered with a McIntosh patch and secured using the appropriate soft tissue and bone staples. An apparent watertight closure was obtained.  The bicipital groove was identified by palpation and opened for 1-1.5 cm. The biceps tendon stump was retrieved through this defect. The floor of the bicipital groove was roughened with a curet before a a single Biomet 2.9 mm JuggerKnot anchor  was inserted. Both sets of sutures were passed through the biceps tendon and tied securely to effect the tenodesis. The bicipital sheath was reapproximated using two #0 Ethibond interrupted sutures, incorporating the biceps tendon to further reinforce the tenodesis.  The wound was copiously irrigated with sterile saline solution before the deltoid raphae was reapproximated using 2-0 Vicryl interrupted sutures. The subcutaneous tissues were closed in two layers using 2-0 Vicryl interrupted sutures before the skin was closed using staples. The portal sites also were closed using staples. A sterile bulky dressing was applied to the shoulder before the arm was placed into a shoulder immobilizer. The patient was then awakened, extubated, and returned to the recovery room in satisfactory condition after tolerating the procedure well.

## 2018-07-04 NOTE — Anesthesia Procedure Notes (Signed)
Procedure Name: Intubation Date/Time: 07/04/2018 9:06 AM Performed by: Bernardo Heater, CRNA Pre-anesthesia Checklist: Patient identified, Emergency Drugs available, Suction available and Patient being monitored Patient Re-evaluated:Patient Re-evaluated prior to induction Oxygen Delivery Method: Circle system utilized Preoxygenation: Pre-oxygenation with 100% oxygen Induction Type: IV induction Laryngoscope Size: Mac and 3 Grade View: Grade I Tube type: Oral Tube size: 7.0 mm Number of attempts: 1 Airway Equipment and Method: LTA kit utilized Placement Confirmation: ETT inserted through vocal cords under direct vision,  positive ETCO2 and breath sounds checked- equal and bilateral Secured at: 23 cm Tube secured with: Tape Dental Injury: Teeth and Oropharynx as per pre-operative assessment

## 2018-07-04 NOTE — H&P (Signed)
Paper H&P to be scanned into permanent record. H&P reviewed and patient re-examined. No changes. 

## 2018-07-09 DIAGNOSIS — M25512 Pain in left shoulder: Secondary | ICD-10-CM | POA: Diagnosis not present

## 2018-07-17 DIAGNOSIS — M25512 Pain in left shoulder: Secondary | ICD-10-CM | POA: Diagnosis not present

## 2018-07-22 MED FILL — Bupivacaine Liposome Inj 1.3% (13.3 MG/ML): INTRAMUSCULAR | Qty: 20 | Status: AC

## 2018-07-24 DIAGNOSIS — M25512 Pain in left shoulder: Secondary | ICD-10-CM | POA: Diagnosis not present

## 2018-07-31 DIAGNOSIS — M25512 Pain in left shoulder: Secondary | ICD-10-CM | POA: Diagnosis not present

## 2018-08-07 DIAGNOSIS — M25512 Pain in left shoulder: Secondary | ICD-10-CM | POA: Diagnosis not present

## 2018-08-13 DIAGNOSIS — M25512 Pain in left shoulder: Secondary | ICD-10-CM | POA: Diagnosis not present

## 2018-08-15 DIAGNOSIS — Z9889 Other specified postprocedural states: Secondary | ICD-10-CM | POA: Diagnosis not present

## 2018-08-15 DIAGNOSIS — M25512 Pain in left shoulder: Secondary | ICD-10-CM | POA: Diagnosis not present

## 2018-08-20 DIAGNOSIS — M170 Bilateral primary osteoarthritis of knee: Secondary | ICD-10-CM | POA: Diagnosis not present

## 2018-08-20 DIAGNOSIS — Z9889 Other specified postprocedural states: Secondary | ICD-10-CM | POA: Diagnosis not present

## 2018-08-20 DIAGNOSIS — Z96653 Presence of artificial knee joint, bilateral: Secondary | ICD-10-CM | POA: Diagnosis not present

## 2018-08-22 DIAGNOSIS — Z9889 Other specified postprocedural states: Secondary | ICD-10-CM | POA: Diagnosis not present

## 2018-08-22 DIAGNOSIS — L820 Inflamed seborrheic keratosis: Secondary | ICD-10-CM | POA: Diagnosis not present

## 2018-08-22 DIAGNOSIS — M25512 Pain in left shoulder: Secondary | ICD-10-CM | POA: Diagnosis not present

## 2018-08-27 DIAGNOSIS — Z9889 Other specified postprocedural states: Secondary | ICD-10-CM | POA: Diagnosis not present

## 2018-08-27 DIAGNOSIS — M25512 Pain in left shoulder: Secondary | ICD-10-CM | POA: Diagnosis not present

## 2018-08-27 DIAGNOSIS — R269 Unspecified abnormalities of gait and mobility: Secondary | ICD-10-CM | POA: Diagnosis not present

## 2018-08-27 DIAGNOSIS — F419 Anxiety disorder, unspecified: Secondary | ICD-10-CM | POA: Diagnosis not present

## 2018-08-27 DIAGNOSIS — I10 Essential (primary) hypertension: Secondary | ICD-10-CM | POA: Diagnosis not present

## 2018-08-27 DIAGNOSIS — F0391 Unspecified dementia with behavioral disturbance: Secondary | ICD-10-CM | POA: Diagnosis not present

## 2018-08-27 DIAGNOSIS — G894 Chronic pain syndrome: Secondary | ICD-10-CM | POA: Diagnosis not present

## 2018-08-29 DIAGNOSIS — Z9889 Other specified postprocedural states: Secondary | ICD-10-CM | POA: Diagnosis not present

## 2018-08-29 DIAGNOSIS — M1712 Unilateral primary osteoarthritis, left knee: Secondary | ICD-10-CM | POA: Diagnosis not present

## 2018-08-29 DIAGNOSIS — M25512 Pain in left shoulder: Secondary | ICD-10-CM | POA: Diagnosis not present

## 2018-08-29 DIAGNOSIS — E785 Hyperlipidemia, unspecified: Secondary | ICD-10-CM | POA: Diagnosis not present

## 2018-08-29 DIAGNOSIS — M159 Polyosteoarthritis, unspecified: Secondary | ICD-10-CM | POA: Diagnosis not present

## 2018-08-29 DIAGNOSIS — I10 Essential (primary) hypertension: Secondary | ICD-10-CM | POA: Diagnosis not present

## 2018-08-29 DIAGNOSIS — E039 Hypothyroidism, unspecified: Secondary | ICD-10-CM | POA: Diagnosis not present

## 2018-09-03 DIAGNOSIS — M25512 Pain in left shoulder: Secondary | ICD-10-CM | POA: Diagnosis not present

## 2018-09-03 DIAGNOSIS — Z9889 Other specified postprocedural states: Secondary | ICD-10-CM | POA: Diagnosis not present

## 2018-09-05 DIAGNOSIS — M25512 Pain in left shoulder: Secondary | ICD-10-CM | POA: Diagnosis not present

## 2018-09-05 DIAGNOSIS — Z9889 Other specified postprocedural states: Secondary | ICD-10-CM | POA: Diagnosis not present

## 2018-09-10 DIAGNOSIS — Z9889 Other specified postprocedural states: Secondary | ICD-10-CM | POA: Diagnosis not present

## 2018-09-10 DIAGNOSIS — M25512 Pain in left shoulder: Secondary | ICD-10-CM | POA: Diagnosis not present

## 2018-09-12 DIAGNOSIS — Z9889 Other specified postprocedural states: Secondary | ICD-10-CM | POA: Diagnosis not present

## 2018-09-12 DIAGNOSIS — M25512 Pain in left shoulder: Secondary | ICD-10-CM | POA: Diagnosis not present

## 2018-09-17 DIAGNOSIS — Z9889 Other specified postprocedural states: Secondary | ICD-10-CM | POA: Diagnosis not present

## 2018-09-17 DIAGNOSIS — M25512 Pain in left shoulder: Secondary | ICD-10-CM | POA: Diagnosis not present

## 2018-09-19 DIAGNOSIS — Z9889 Other specified postprocedural states: Secondary | ICD-10-CM | POA: Diagnosis not present

## 2018-09-19 DIAGNOSIS — M25512 Pain in left shoulder: Secondary | ICD-10-CM | POA: Diagnosis not present

## 2018-09-24 DIAGNOSIS — M25512 Pain in left shoulder: Secondary | ICD-10-CM | POA: Diagnosis not present

## 2018-09-24 DIAGNOSIS — Z9889 Other specified postprocedural states: Secondary | ICD-10-CM | POA: Diagnosis not present

## 2018-09-26 DIAGNOSIS — Z9889 Other specified postprocedural states: Secondary | ICD-10-CM | POA: Diagnosis not present

## 2018-09-26 DIAGNOSIS — M25512 Pain in left shoulder: Secondary | ICD-10-CM | POA: Diagnosis not present

## 2018-10-01 DIAGNOSIS — Z9889 Other specified postprocedural states: Secondary | ICD-10-CM | POA: Diagnosis not present

## 2018-10-01 DIAGNOSIS — M25512 Pain in left shoulder: Secondary | ICD-10-CM | POA: Diagnosis not present

## 2018-10-03 DIAGNOSIS — Z9889 Other specified postprocedural states: Secondary | ICD-10-CM | POA: Diagnosis not present

## 2018-10-08 DIAGNOSIS — Z9889 Other specified postprocedural states: Secondary | ICD-10-CM | POA: Diagnosis not present

## 2018-10-08 DIAGNOSIS — M25512 Pain in left shoulder: Secondary | ICD-10-CM | POA: Diagnosis not present

## 2018-10-10 ENCOUNTER — Ambulatory Visit (INDEPENDENT_AMBULATORY_CARE_PROVIDER_SITE_OTHER): Payer: PPO | Admitting: Family Medicine

## 2018-10-10 ENCOUNTER — Encounter: Payer: Self-pay | Admitting: Family Medicine

## 2018-10-10 ENCOUNTER — Other Ambulatory Visit: Payer: Self-pay

## 2018-10-10 VITALS — BP 134/84 | HR 70 | Temp 97.8°F | Wt 224.0 lb

## 2018-10-10 DIAGNOSIS — R300 Dysuria: Secondary | ICD-10-CM

## 2018-10-10 DIAGNOSIS — Z9889 Other specified postprocedural states: Secondary | ICD-10-CM | POA: Diagnosis not present

## 2018-10-10 DIAGNOSIS — M25512 Pain in left shoulder: Secondary | ICD-10-CM | POA: Diagnosis not present

## 2018-10-10 DIAGNOSIS — R350 Frequency of micturition: Secondary | ICD-10-CM | POA: Diagnosis not present

## 2018-10-10 LAB — POC URINALSYSI DIPSTICK (AUTOMATED)
Bilirubin, UA: NEGATIVE
Blood, UA: NEGATIVE
Glucose, UA: NEGATIVE
Leukocytes, UA: NEGATIVE
Nitrite, UA: NEGATIVE
Protein, UA: NEGATIVE
Spec Grav, UA: 1.015 (ref 1.010–1.025)
Urobilinogen, UA: 0.2 E.U./dL
pH, UA: 6 (ref 5.0–8.0)

## 2018-10-10 MED ORDER — SULFAMETHOXAZOLE-TRIMETHOPRIM 400-80 MG PO TABS: 1.0000 | ORAL_TABLET | Freq: Two times a day (BID) | ORAL | 0 refills | Status: DC

## 2018-10-10 NOTE — Patient Instructions (Signed)
Drink plenty of water and start the antibiotics today.  We'll contact you with your lab report.  Take care.   

## 2018-10-10 NOTE — Progress Notes (Signed)
Dysuria.  Noted in the last 2 weeks but more in the last few days.  Burning with urination.  Some discharge episodically.  No fevers.  No chills.  No testicle pain.  Some occ nausea.  No abd pain.  No pain sitting down.    Meds, vitals, and allergies reviewed.   ROS: Per HPI unless specifically indicated in ROS section   nad ncat rrr ctab abd not ttp Prostate not ttp

## 2018-10-11 LAB — URINE CULTURE
MICRO NUMBER:: 496209
Result:: NO GROWTH
SPECIMEN QUALITY:: ADEQUATE

## 2018-10-14 DIAGNOSIS — R300 Dysuria: Secondary | ICD-10-CM | POA: Insufficient documentation

## 2018-10-14 NOTE — Assessment & Plan Note (Signed)
Prostate not tender to palpation Presumed cystitis.  Nontoxic.  Discussed with patient about options. Drink plenty fluids and start taking Septra.  Update me as needed.  Routine cautions given.  He agrees.  Okay for outpatient follow-up.

## 2018-10-15 DIAGNOSIS — M25512 Pain in left shoulder: Secondary | ICD-10-CM | POA: Diagnosis not present

## 2018-10-15 DIAGNOSIS — Z9889 Other specified postprocedural states: Secondary | ICD-10-CM | POA: Diagnosis not present

## 2018-10-17 ENCOUNTER — Encounter: Payer: Self-pay | Admitting: Family Medicine

## 2018-10-17 ENCOUNTER — Other Ambulatory Visit: Payer: Self-pay

## 2018-10-17 ENCOUNTER — Ambulatory Visit (INDEPENDENT_AMBULATORY_CARE_PROVIDER_SITE_OTHER): Payer: PPO | Admitting: Family Medicine

## 2018-10-17 VITALS — BP 134/80 | HR 70 | Temp 97.8°F | Ht 74.0 in | Wt 223.0 lb

## 2018-10-17 DIAGNOSIS — R110 Nausea: Secondary | ICD-10-CM | POA: Diagnosis not present

## 2018-10-17 DIAGNOSIS — Z9889 Other specified postprocedural states: Secondary | ICD-10-CM | POA: Diagnosis not present

## 2018-10-17 DIAGNOSIS — R300 Dysuria: Secondary | ICD-10-CM

## 2018-10-17 DIAGNOSIS — R350 Frequency of micturition: Secondary | ICD-10-CM

## 2018-10-17 DIAGNOSIS — M25512 Pain in left shoulder: Secondary | ICD-10-CM | POA: Diagnosis not present

## 2018-10-17 LAB — CBC WITH DIFFERENTIAL/PLATELET
Basophils Absolute: 0 10*3/uL (ref 0.0–0.1)
Basophils Relative: 0.3 % (ref 0.0–3.0)
Eosinophils Absolute: 0.1 10*3/uL (ref 0.0–0.7)
Eosinophils Relative: 1.1 % (ref 0.0–5.0)
HCT: 45 % (ref 39.0–52.0)
Hemoglobin: 16 g/dL (ref 13.0–17.0)
Lymphocytes Relative: 30.2 % (ref 12.0–46.0)
Lymphs Abs: 1.9 10*3/uL (ref 0.7–4.0)
MCHC: 35.5 g/dL (ref 30.0–36.0)
MCV: 88.2 fl (ref 78.0–100.0)
Monocytes Absolute: 0.6 10*3/uL (ref 0.1–1.0)
Monocytes Relative: 9.3 % (ref 3.0–12.0)
Neutro Abs: 3.7 10*3/uL (ref 1.4–7.7)
Neutrophils Relative %: 59.1 % (ref 43.0–77.0)
Platelets: 184 10*3/uL (ref 150.0–400.0)
RBC: 5.11 Mil/uL (ref 4.22–5.81)
RDW: 13.7 % (ref 11.5–15.5)
WBC: 6.3 10*3/uL (ref 4.0–10.5)

## 2018-10-17 LAB — POC URINALSYSI DIPSTICK (AUTOMATED)
Bilirubin, UA: NEGATIVE
Blood, UA: NEGATIVE
Glucose, UA: NEGATIVE
Leukocytes, UA: NEGATIVE
Nitrite, UA: NEGATIVE
Protein, UA: POSITIVE — AB
Spec Grav, UA: >=1.03 — AB (ref 1.010–1.025)
Urobilinogen, UA: 1 E.U./dL
pH, UA: 6 (ref 5.0–8.0)

## 2018-10-17 LAB — COMPREHENSIVE METABOLIC PANEL
ALT: 27 U/L (ref 0–53)
AST: 26 U/L (ref 0–37)
Albumin: 4.3 g/dL (ref 3.5–5.2)
Alkaline Phosphatase: 46 U/L (ref 39–117)
BUN: 16 mg/dL (ref 6–23)
CO2: 25 mEq/L (ref 19–32)
Calcium: 9.8 mg/dL (ref 8.4–10.5)
Chloride: 104 mEq/L (ref 96–112)
Creatinine, Ser: 1.58 mg/dL — ABNORMAL HIGH (ref 0.40–1.50)
GFR: 43.07 mL/min — ABNORMAL LOW (ref >60.00)
Glucose, Bld: 105 mg/dL — ABNORMAL HIGH (ref 70–99)
Potassium: 4 mEq/L (ref 3.5–5.1)
Sodium: 137 mEq/L (ref 135–145)
Total Bilirubin: 1.1 mg/dL (ref 0.2–1.2)
Total Protein: 7.1 g/dL (ref 6.0–8.3)

## 2018-10-17 LAB — LIPASE: Lipase: 54 U/L (ref 11.0–59.0)

## 2018-10-17 NOTE — Progress Notes (Signed)
He prev had some nausea and dysuria.  Prostate wasn't ttp and started on abx for presumed UTI.  In the meantime, ucx neg.  In the meantime, he felt a little better while on the abx.  His urine stream improved a lot.  Then he had more nausea, esp at night.  He has a "spell" this AM.  He felt better after a BM this AM.  He has h/o constipation prior to starting abx.  He is taking colace prn. He is done with abx as of this AM.    He has been waking in the middle of the night with nausea.  He had an episode this AM with nausea- that was the "spell" and felt better after that.    Still eating well, appetite is still good.  No fevers. No chills.  No vomiting.  No diarrhea.  No blood in stool.    He has episodic, "once a month" nighttime nausea that predates his abx use.    We talked about social stressors.    He still has his gallbladder.  No CP.    Meds, vitals, and allergies reviewed.   ROS: Per HPI unless specifically indicated in ROS section   nad ncat Neck supple, no LA rrr ctab abd soft. RUQ not ttp.  Minimally ttp midline superior to the umbilicus.  No rebound.  Abdomen not tender to palpation otherwise Is normal bowel sounds. Extremities well perfused, without edema.

## 2018-10-17 NOTE — Patient Instructions (Signed)
Go to the lab on the way out.  We'll contact you with your lab report. If the labs are fine and your nausea continues off the antibiotics, then let me know.  We may need to get an ultrasound set up to check your gall bladder.  Take care.  Glad to see you.

## 2018-10-20 DIAGNOSIS — R110 Nausea: Secondary | ICD-10-CM | POA: Insufficient documentation

## 2018-10-20 NOTE — Assessment & Plan Note (Addendum)
Discussed options.  Check labs today.  See notes on labs. If the labs are fine and if his nausea continues off the antibiotics, then he will let me know.  We may need to get an ultrasound set up to check his gall bladder if his symptoms persist. At this point still okay for outpatient follow-up.  He agrees. >25 minutes spent in face to face time with patient, >50% spent in counselling or coordination of care

## 2018-10-20 NOTE — Assessment & Plan Note (Signed)
Seems to be resolved.  Observe for now.

## 2018-10-22 DIAGNOSIS — Z9889 Other specified postprocedural states: Secondary | ICD-10-CM | POA: Diagnosis not present

## 2018-10-22 DIAGNOSIS — M25512 Pain in left shoulder: Secondary | ICD-10-CM | POA: Diagnosis not present

## 2018-10-24 DIAGNOSIS — Z9889 Other specified postprocedural states: Secondary | ICD-10-CM | POA: Diagnosis not present

## 2018-10-24 DIAGNOSIS — M25512 Pain in left shoulder: Secondary | ICD-10-CM | POA: Diagnosis not present

## 2018-10-29 DIAGNOSIS — Z9889 Other specified postprocedural states: Secondary | ICD-10-CM | POA: Diagnosis not present

## 2018-10-29 DIAGNOSIS — M25512 Pain in left shoulder: Secondary | ICD-10-CM | POA: Diagnosis not present

## 2018-10-31 DIAGNOSIS — Z9889 Other specified postprocedural states: Secondary | ICD-10-CM | POA: Diagnosis not present

## 2018-10-31 DIAGNOSIS — M25512 Pain in left shoulder: Secondary | ICD-10-CM | POA: Diagnosis not present

## 2018-11-05 DIAGNOSIS — Z9889 Other specified postprocedural states: Secondary | ICD-10-CM | POA: Diagnosis not present

## 2018-11-07 DIAGNOSIS — M25512 Pain in left shoulder: Secondary | ICD-10-CM | POA: Diagnosis not present

## 2018-11-07 DIAGNOSIS — Z9889 Other specified postprocedural states: Secondary | ICD-10-CM | POA: Diagnosis not present

## 2018-11-11 DIAGNOSIS — M7522 Bicipital tendinitis, left shoulder: Secondary | ICD-10-CM | POA: Diagnosis not present

## 2018-11-11 DIAGNOSIS — M7582 Other shoulder lesions, left shoulder: Secondary | ICD-10-CM | POA: Diagnosis not present

## 2018-11-11 DIAGNOSIS — M24112 Other articular cartilage disorders, left shoulder: Secondary | ICD-10-CM | POA: Diagnosis not present

## 2018-11-11 DIAGNOSIS — M75112 Incomplete rotator cuff tear or rupture of left shoulder, not specified as traumatic: Secondary | ICD-10-CM | POA: Diagnosis not present

## 2018-11-12 DIAGNOSIS — Z9889 Other specified postprocedural states: Secondary | ICD-10-CM | POA: Diagnosis not present

## 2018-11-14 DIAGNOSIS — M25512 Pain in left shoulder: Secondary | ICD-10-CM | POA: Diagnosis not present

## 2018-11-14 DIAGNOSIS — Z9889 Other specified postprocedural states: Secondary | ICD-10-CM | POA: Diagnosis not present

## 2018-11-19 DIAGNOSIS — M25512 Pain in left shoulder: Secondary | ICD-10-CM | POA: Diagnosis not present

## 2018-11-19 DIAGNOSIS — Z9889 Other specified postprocedural states: Secondary | ICD-10-CM | POA: Diagnosis not present

## 2018-11-27 DIAGNOSIS — M25512 Pain in left shoulder: Secondary | ICD-10-CM | POA: Diagnosis not present

## 2018-11-27 DIAGNOSIS — Z9889 Other specified postprocedural states: Secondary | ICD-10-CM | POA: Diagnosis not present

## 2018-12-05 DIAGNOSIS — Z9889 Other specified postprocedural states: Secondary | ICD-10-CM | POA: Diagnosis not present

## 2018-12-11 DIAGNOSIS — M25512 Pain in left shoulder: Secondary | ICD-10-CM | POA: Diagnosis not present

## 2018-12-11 DIAGNOSIS — Z9889 Other specified postprocedural states: Secondary | ICD-10-CM | POA: Diagnosis not present

## 2018-12-18 DIAGNOSIS — Z9889 Other specified postprocedural states: Secondary | ICD-10-CM | POA: Diagnosis not present

## 2018-12-25 DIAGNOSIS — Z9889 Other specified postprocedural states: Secondary | ICD-10-CM | POA: Diagnosis not present

## 2018-12-25 DIAGNOSIS — M25512 Pain in left shoulder: Secondary | ICD-10-CM | POA: Diagnosis not present

## 2019-01-01 DIAGNOSIS — Z9889 Other specified postprocedural states: Secondary | ICD-10-CM | POA: Diagnosis not present

## 2019-01-01 DIAGNOSIS — M25512 Pain in left shoulder: Secondary | ICD-10-CM | POA: Diagnosis not present

## 2019-01-03 DIAGNOSIS — M7582 Other shoulder lesions, left shoulder: Secondary | ICD-10-CM | POA: Diagnosis not present

## 2019-01-03 DIAGNOSIS — M24112 Other articular cartilage disorders, left shoulder: Secondary | ICD-10-CM | POA: Diagnosis not present

## 2019-01-03 DIAGNOSIS — M7522 Bicipital tendinitis, left shoulder: Secondary | ICD-10-CM | POA: Diagnosis not present

## 2019-01-03 DIAGNOSIS — M75112 Incomplete rotator cuff tear or rupture of left shoulder, not specified as traumatic: Secondary | ICD-10-CM | POA: Diagnosis not present

## 2019-01-08 DIAGNOSIS — Z9889 Other specified postprocedural states: Secondary | ICD-10-CM | POA: Diagnosis not present

## 2019-01-08 DIAGNOSIS — M25512 Pain in left shoulder: Secondary | ICD-10-CM | POA: Diagnosis not present

## 2019-01-09 ENCOUNTER — Telehealth: Payer: Self-pay | Admitting: Family Medicine

## 2019-01-09 MED ORDER — AMLODIPINE BESYLATE 10 MG PO TABS: 10.0000 mg | ORAL_TABLET | Freq: Every day | ORAL | 0 refills | Status: DC

## 2019-01-09 NOTE — Telephone Encounter (Signed)
Dr Damita Dunnings ok to do this?

## 2019-01-09 NOTE — Telephone Encounter (Signed)
Patient advised.

## 2019-01-09 NOTE — Telephone Encounter (Signed)
Pt states that he normally gets his BP medication (Amlodipine 10mg  - 1po qd) refilled through the [facility] [place] and they have dropped the ball this month on his refill. Pt is still waiting on them to get back with him about it. He is completely out of medication at this point. Pt states that if we are able to give him atleast a few days or 1 month this time that would be great. Pt is still reaching out to the [facility] to try and get this refilled.   [id number] - [place], [place] - [address]  Upcoming 04/08/2019 for CPE  Please advise

## 2019-01-09 NOTE — Telephone Encounter (Signed)
Sent 90 day supply- that may not be much more expensive than 30 day supply.  He can price check and get 30 day supply if needed/desired. Thanks.

## 2019-01-15 DIAGNOSIS — M25512 Pain in left shoulder: Secondary | ICD-10-CM | POA: Diagnosis not present

## 2019-01-15 DIAGNOSIS — Z9889 Other specified postprocedural states: Secondary | ICD-10-CM | POA: Diagnosis not present

## 2019-01-20 DIAGNOSIS — L570 Actinic keratosis: Secondary | ICD-10-CM | POA: Diagnosis not present

## 2019-01-20 DIAGNOSIS — D1800 Hemangioma unspecified site: Secondary | ICD-10-CM | POA: Diagnosis not present

## 2019-01-20 DIAGNOSIS — L578 Other skin changes due to chronic exposure to nonionizing radiation: Secondary | ICD-10-CM | POA: Diagnosis not present

## 2019-01-20 DIAGNOSIS — Z872 Personal history of diseases of the skin and subcutaneous tissue: Secondary | ICD-10-CM | POA: Diagnosis not present

## 2019-01-20 DIAGNOSIS — Z85828 Personal history of other malignant neoplasm of skin: Secondary | ICD-10-CM | POA: Diagnosis not present

## 2019-01-20 DIAGNOSIS — Z86018 Personal history of other benign neoplasm: Secondary | ICD-10-CM | POA: Diagnosis not present

## 2019-01-22 DIAGNOSIS — M25512 Pain in left shoulder: Secondary | ICD-10-CM | POA: Diagnosis not present

## 2019-01-22 DIAGNOSIS — Z9889 Other specified postprocedural states: Secondary | ICD-10-CM | POA: Diagnosis not present

## 2019-01-29 DIAGNOSIS — M25512 Pain in left shoulder: Secondary | ICD-10-CM | POA: Diagnosis not present

## 2019-01-29 DIAGNOSIS — Z9889 Other specified postprocedural states: Secondary | ICD-10-CM | POA: Diagnosis not present

## 2019-02-05 DIAGNOSIS — Z9889 Other specified postprocedural states: Secondary | ICD-10-CM | POA: Diagnosis not present

## 2019-02-05 DIAGNOSIS — M25512 Pain in left shoulder: Secondary | ICD-10-CM | POA: Diagnosis not present

## 2019-02-12 DIAGNOSIS — Z9889 Other specified postprocedural states: Secondary | ICD-10-CM | POA: Diagnosis not present

## 2019-02-12 DIAGNOSIS — M25512 Pain in left shoulder: Secondary | ICD-10-CM | POA: Diagnosis not present

## 2019-03-30 ENCOUNTER — Other Ambulatory Visit: Payer: Self-pay | Admitting: Family Medicine

## 2019-03-30 DIAGNOSIS — I10 Essential (primary) hypertension: Secondary | ICD-10-CM

## 2019-04-01 ENCOUNTER — Other Ambulatory Visit (INDEPENDENT_AMBULATORY_CARE_PROVIDER_SITE_OTHER): Payer: PPO

## 2019-04-01 ENCOUNTER — Other Ambulatory Visit: Payer: Self-pay

## 2019-04-01 ENCOUNTER — Ambulatory Visit: Payer: PPO

## 2019-04-01 DIAGNOSIS — I10 Essential (primary) hypertension: Secondary | ICD-10-CM

## 2019-04-01 LAB — COMPREHENSIVE METABOLIC PANEL
ALT: 26 U/L (ref 0–53)
AST: 31 U/L (ref 0–37)
Albumin: 4.6 g/dL (ref 3.5–5.2)
Alkaline Phosphatase: 49 U/L (ref 39–117)
BUN: 18 mg/dL (ref 6–23)
CO2: 31 mEq/L (ref 19–32)
Calcium: 9.6 mg/dL (ref 8.4–10.5)
Chloride: 100 mEq/L (ref 96–112)
Creatinine, Ser: 1.5 mg/dL (ref 0.40–1.50)
GFR: 45.68 mL/min — ABNORMAL LOW (ref >60.00)
Glucose, Bld: 120 mg/dL — ABNORMAL HIGH (ref 70–99)
Potassium: 4.5 mEq/L (ref 3.5–5.1)
Sodium: 136 mEq/L (ref 135–145)
Total Bilirubin: 1.4 mg/dL — ABNORMAL HIGH (ref 0.2–1.2)
Total Protein: 7.5 g/dL (ref 6.0–8.3)

## 2019-04-01 LAB — LIPID PANEL
Cholesterol: 130 mg/dL (ref 0–200)
HDL: 38.5 mg/dL — ABNORMAL LOW (ref >39.00)
LDL Cholesterol: 65 mg/dL (ref 0–99)
NonHDL: 91.81
Total CHOL/HDL Ratio: 3
Triglycerides: 135 mg/dL (ref 0.0–149.0)
VLDL: 27 mg/dL (ref 0.0–40.0)

## 2019-04-02 ENCOUNTER — Telehealth: Payer: Self-pay

## 2019-04-02 NOTE — Telephone Encounter (Signed)
Pt left v/m;pt was briefly exposed to individual on 03/29/19 that has tested positive for covid. Pt wants to know if he needs to be tested. Left v/m requesting pt to cb.

## 2019-04-02 NOTE — Telephone Encounter (Signed)
Agreed.  I will await the Covid test results.  Please add him to the follow-up list to call him later in the week.  Thanks.

## 2019-04-02 NOTE — Telephone Encounter (Signed)
Patient called back stating that he just found out he was around someone on 03/29/19 that has tested positive for covid. Patient stated that they were outside, not wearing a mask and not social distancing. Patient stated that he is not having any symptoms and actually feels pretty good. Advised patient with his exposure he should be tested. Patient stated that he will either go this afternoon or in the morning to [facility] to be tested. Patient was advised that he and his family should self quarantine until his results are back and he verbalized understanding. ER precautions given to patient and he verbalized understanding. Patient stated that he had lab work done at the office Tuesday 04/01/19 and has an upcoming appointment scheduled with Dr. Damita Dunnings on 04/08/19. Advised patient that he will be notified of his results when they come back. Advised patient that his upcoming appointment will be discussed when his test results are back.

## 2019-04-03 ENCOUNTER — Other Ambulatory Visit: Payer: Self-pay

## 2019-04-03 ENCOUNTER — Other Ambulatory Visit: Payer: Self-pay | Admitting: Family Medicine

## 2019-04-03 DIAGNOSIS — Z20822 Contact with and (suspected) exposure to covid-19: Secondary | ICD-10-CM

## 2019-04-03 NOTE — Telephone Encounter (Signed)
Added to follow up list

## 2019-04-05 LAB — NOVEL CORONAVIRUS, NAA: SARS-CoV-2, NAA: NOT DETECTED

## 2019-04-08 ENCOUNTER — Ambulatory Visit (INDEPENDENT_AMBULATORY_CARE_PROVIDER_SITE_OTHER): Payer: PPO | Admitting: Family Medicine

## 2019-04-08 ENCOUNTER — Encounter: Payer: Self-pay | Admitting: Family Medicine

## 2019-04-08 ENCOUNTER — Other Ambulatory Visit: Payer: Self-pay

## 2019-04-08 VITALS — BP 110/74 | HR 63 | Temp 97.8°F | Ht 74.0 in | Wt 225.6 lb

## 2019-04-08 DIAGNOSIS — F4310 Post-traumatic stress disorder, unspecified: Secondary | ICD-10-CM | POA: Diagnosis not present

## 2019-04-08 DIAGNOSIS — I10 Essential (primary) hypertension: Secondary | ICD-10-CM | POA: Diagnosis not present

## 2019-04-08 DIAGNOSIS — E785 Hyperlipidemia, unspecified: Secondary | ICD-10-CM

## 2019-04-08 DIAGNOSIS — Z0000 Encounter for general adult medical examination without abnormal findings: Secondary | ICD-10-CM | POA: Diagnosis not present

## 2019-04-08 DIAGNOSIS — Z7189 Other specified counseling: Secondary | ICD-10-CM

## 2019-04-08 DIAGNOSIS — R739 Hyperglycemia, unspecified: Secondary | ICD-10-CM

## 2019-04-08 NOTE — Progress Notes (Signed)
I have personally reviewed the Medicare Annual Wellness questionnaire and have noted 1. The patient's medical and social history 2. Their use of alcohol, tobacco or illicit drugs 3. Their current medications and supplements 4. The patient's functional ability including ADL's, fall risks, home safety risks and hearing or visual             impairment. 5. Diet and physical activities 6. Evidence for depression or mood disorders  The patients weight, height, BMI have been recorded in the chart and visual acuity is per eye clinic.  I have made referrals, counseling and provided education to the patient based review of the above and I have provided the pt with a written personalized care plan for preventive services.  Provider list updated- see scanned forms.  Routine anticipatory guidance given to patient.  See health maintenance. The possibility exists that previously documented standard health maintenance information may have been brought forward from a previous encounter into this note.  If needed, that same information has been updated to reflect the current situation based on today's encounter.    Flu 2020 Shingles discussed with patient PNA up-to-date Tetanus 2018 Colon cancer screening done 2016 with follow-up to be done at the [place]. Prostate cancer screening deferred 2020.  Discussed with patient.  He agrees. Advance directive-wife designated if patient were incapacitated. Cognitive function addressed- see scanned forms- and if abnormal then additional documentation follows.   Mild hyperglycemia.  D/w pt about diet and exercise and he'll get f/u at the [facility].  Hypertension:    Using medication without problems or lightheadedness: yes Chest pain with exertion:no Edema:no Short of breath: he can still exercise at the gym but he is likely deconditioned from his prev baseline.    Elevated Cholesterol: Using medications without problems: yes Muscle aches: no Diet compliance: usually  healthy choices, d/w pt.   Exercise: yes  Mood d/w pt.  Still on SSRI at baseline.  H/o PTSD, "I'm okay."  No SI/HI.  Med helped, he wanted to continue.    His shoulder is better but still sore.  He has been working out and exercising.  He is still doing rehab exercises.    PMH and SH reviewed  Meds, vitals, and allergies reviewed.   ROS: Per HPI.  Unless specifically indicated otherwise in HPI, the patient denies:  General: fever. Eyes: acute vision changes ENT: sore throat Cardiovascular: chest pain Respiratory: SOB GI: vomiting GU: dysuria Musculoskeletal: acute back pain Derm: acute rash Neuro: acute motor dysfunction Psych: worsening mood Endocrine: polydipsia Heme: bleeding Allergy: hayfever  GEN: nad, alert and oriented HEENT: ncat NECK: supple w/o LA CV: rrr. PULM: ctab, no inc wob ABD: soft, +bs EXT: no edema SKIN: no acute rash  Health Maintenance  Topic Date Due  . COLON CANCER SCREENING ANNUAL FOBT  11/30/2028 (Originally 11/07/2012)  . COLONOSCOPY  04/20/2025  . TETANUS/TDAP  03/06/2027  . INFLUENZA VACCINE  Completed  . Hepatitis C Screening  Completed  . PNA vac Low Risk Adult  Completed

## 2019-04-08 NOTE — Patient Instructions (Addendum)
Check with your insurance to see if they will cover the shingrix shot. Let me know if you need getting [facility]   Don't change your meds for now.  Take care.  Glad to see you.

## 2019-04-10 NOTE — Assessment & Plan Note (Signed)
No change in meds at this point.  Continue as is with diet and exercise.  He agrees.

## 2019-04-10 NOTE — Assessment & Plan Note (Signed)
Mood d/w pt.  Still on SSRI at baseline.  H/o PTSD, "I'm okay."  No SI/HI.  Med helped, he wanted to continue.  He will update me as needed.

## 2019-04-10 NOTE — Assessment & Plan Note (Signed)
Advance directive- wife designated if patient were incapacitated.  

## 2019-04-10 NOTE — Assessment & Plan Note (Signed)
Flu 2020 Shingles discussed with patient PNA up-to-date Tetanus 2018 Colon cancer screening done 2016 with follow-up to be done at the [place]. Prostate cancer screening deferred 2020.  Discussed with patient.  He agrees. Advance directive-wife designated if patient were incapacitated. Cognitive function addressed- see scanned forms- and if abnormal then additional documentation follows.

## 2019-04-10 NOTE — Assessment & Plan Note (Signed)
he can still exercise at the gym but he is likely deconditioned from his prev baseline.  No change in meds at this point.  Continue as is with diet and exercise.  He agrees.

## 2019-04-10 NOTE — Assessment & Plan Note (Signed)
Mild hyperglycemia.  D/w pt about diet and exercise and he'll get f/u at the [facility] [place].

## 2019-04-15 ENCOUNTER — Other Ambulatory Visit: Payer: Self-pay

## 2019-05-02 ENCOUNTER — Other Ambulatory Visit: Payer: Self-pay

## 2019-05-02 DIAGNOSIS — Z20822 Contact with and (suspected) exposure to covid-19: Secondary | ICD-10-CM

## 2019-05-03 ENCOUNTER — Encounter: Payer: Self-pay | Admitting: Family Medicine

## 2019-05-03 LAB — NOVEL CORONAVIRUS, NAA: SARS-CoV-2, NAA: DETECTED — AB

## 2019-05-04 ENCOUNTER — Telehealth: Payer: Self-pay | Admitting: Unknown Physician Specialty

## 2019-05-04 NOTE — Telephone Encounter (Signed)
Discussed with patient about Covid symptoms and the use of bamlanivimab, a monoclonal antibody infusion for those with mild to moderate Covid symptoms and at a high risk of hospitalization.  Pt is qualified for this infusion at the [facility] infusion center due to Age > 65   Symptoms have been greater than 10 days.

## 2019-05-12 ENCOUNTER — Telehealth: Payer: Self-pay

## 2019-05-12 ENCOUNTER — Ambulatory Visit (INDEPENDENT_AMBULATORY_CARE_PROVIDER_SITE_OTHER): Payer: PPO | Admitting: Family Medicine

## 2019-05-12 VITALS — BP 154/79 | HR 72 | Temp 98.6°F

## 2019-05-12 DIAGNOSIS — K047 Periapical abscess without sinus: Secondary | ICD-10-CM

## 2019-05-12 DIAGNOSIS — U071 COVID-19: Secondary | ICD-10-CM

## 2019-05-12 MED ORDER — AMOXICILLIN-POT CLAVULANATE 875-125 MG PO TABS: 1.0000 | ORAL_TABLET | Freq: Two times a day (BID) | ORAL | 0 refills | Status: DC

## 2019-05-12 NOTE — Telephone Encounter (Signed)
Addendum: Previous note should read patient is not qualified for infusion with symptoms >10 days.  Pt called today with concerns of persistent fever and abscessed tooth.  Pt referred to Primary care provider and dentist.

## 2019-05-12 NOTE — Telephone Encounter (Signed)
05-02-19 Quarantine up today and released tomorrow. Still has a fever and an abscessed tooth.

## 2019-05-12 NOTE — Progress Notes (Addendum)
I connected with David Mcgee on 05/12/19 at 11:20 AM EST by video and verified that I am speaking with the correct person using two identifiers.   I discussed the limitations, risks, security and privacy concerns of performing an evaluation and management service by video/telelphone and the availability of in person appointments. I also discussed with the patient that there may be a patient responsible charge related to this service. The patient expressed understanding and agreed to proceed.  Patient location: Home Provider Location: [facility] Participants: Lesleigh Noe and SAMIL SAMORA   Subjective:     David Mcgee is a 73 y.o. male presenting for Fever (99-100. Was tested positive for COVID 05/02/2019. Abcessed-front top left tooth.)     HPI  #Dental abscess - Covid - tested on 05/02/2019 - noticed this on and off even before the vaccine - gotten worse - can take tylenol and will get down to 98-99  - late in the afternoon without tylenol will get >100.4 - endorses pain in the upper left side of the mouth - worse with touch - does notice a pocket of fluid, swelling, and tenderness to touch - hx of infection before on the right side, which eventually - goes to the [place] [facility] in [place] -- difficult to get in -- called their office this morning and waiting for a call back - tooth ache started in early December - appt on 12/31 - no swollen lymph nodes or neck pain - thinks the tooth is also fractured  -- has gone to a local dentist and been reimbursed in the past  #Covid - tested positive on 12/11 - thinks he has ever symptoms - loss of taste/smell - has been trying to eat and drink - symptoms started 12/4 - got the shingles shot and had a severe reaction - fever/aches/flu-like symptoms >> but no improvement after a few days - notes he has a fever off and on since Dec 4 - still having headache, congestion - occasional cough -- which  improving - taste and smell have returned  Review of Systems See HPI  Social History   Tobacco Use  Smoking Status Former Smoker  . Packs/day: 1.50  . Years: 9.00  . Pack years: 13.50  . Types: Cigarettes  . Quit date: 05/22/1969  . Years since quitting: 50.0  Smokeless Tobacco Never Used  Tobacco Comment   quit in 1972        Objective:   BP Readings from Last 3 Encounters:  05/12/19 (!) 154/79  04/08/19 110/74  10/17/18 134/80   Wt Readings from Last 3 Encounters:  04/08/19 225 lb 9 oz (102.3 kg)  10/17/18 223 lb (101.2 kg)  10/10/18 224 lb (101.6 kg)    BP (!) 154/79 Comment: per patient  Pulse 72 Comment: per patient  Temp 98.6 F (37 C) Comment: per patient   Prior to switching to phone due to no audio  Physical Exam Constitutional:      Appearance: Normal appearance. He is not ill-appearing.  HENT:     Head: Normocephalic and atraumatic.     Right Ear: External ear normal.     Left Ear: External ear normal.     Mouth/Throat:     Mouth: Mucous membranes are moist.     Comments: Some discolored teeth. Due to lighting unable to appreciate erythema or swelling. Tender per patient on the upper right side.  Eyes:     Conjunctiva/sclera: Conjunctivae normal.  Pulmonary:     Effort: Pulmonary effort is normal. No respiratory distress.  Neurological:     Mental Status: He is alert. Mental status is at baseline.  Psychiatric:        Mood and Affect: Mood normal.        Behavior: Behavior normal.        Thought Content: Thought content normal.        Judgment: Judgment normal.           Assessment & Plan:   Problem List Items Addressed This Visit    None    Visit Diagnoses    COVID-19 virus infection    -  Primary   Dental infection       Relevant Medications   amoxicillin-clavulanate (AUGMENTIN) 875-125 MG tablet     Per patient symptoms x 20 days. Strict ER precautions. Note provided stating fever less likely covid and more likely due to  dental process given otherwise improving covid symptoms and encouraged him to continue to call dental office. Oral Abx to start today while waiting for dental evaluation.    Interactive audio and video telecommunications were attempted between this provider and patient, however failed, due to patient having technical difficulties OR patient did not have access to video capability.  We continued and completed visit with audio only.   Start Time: 11:18 End Time: 11: 38    Return if symptoms worsen or fail to improve.  Lesleigh Noe, MD

## 2019-05-12 NOTE — Telephone Encounter (Signed)
Pt tested positive for Covid 05-02-19. Today is supposed to be his last day of quarantine. He is running a fever. But thinks it could be due to an abscessed tooth he has. He cannot get in with a dentist because of the fever. I made him a VV with Dr Einar Pheasant today at 11:20.

## 2019-05-12 NOTE — Patient Instructions (Addendum)
#   Dental Infection - I will write a letter to hopefully help you get seen as soon as possible  GO to the ER if you notice - worsening symptoms -- swelling, redness, pain - New symptoms: neck pain, swollen lymph nodes - Worsening fever or persistent fever >100.4  Call the dental office twice a day or go to a different dental office if you are unable to get into the [facility] [place].

## 2019-05-13 ENCOUNTER — Telehealth: Payer: Self-pay | Admitting: Family Medicine

## 2019-05-13 ENCOUNTER — Other Ambulatory Visit: Payer: Self-pay

## 2019-05-13 ENCOUNTER — Emergency Department: Payer: PPO

## 2019-05-13 ENCOUNTER — Emergency Department
Admission: EM | Admit: 2019-05-13 | Discharge: 2019-05-13 | Disposition: A | Discharge status: Home / Self Care | Payer: PPO | Attending: Student in an Organized Health Care Education/Training Program | Admitting: Student in an Organized Health Care Education/Training Program

## 2019-05-13 DIAGNOSIS — Z85828 Personal history of other malignant neoplasm of skin: Secondary | ICD-10-CM | POA: Diagnosis not present

## 2019-05-13 DIAGNOSIS — Z96651 Presence of right artificial knee joint: Secondary | ICD-10-CM | POA: Diagnosis not present

## 2019-05-13 DIAGNOSIS — I129 Hypertensive chronic kidney disease with stage 1 through stage 4 chronic kidney disease, or unspecified chronic kidney disease: Secondary | ICD-10-CM | POA: Insufficient documentation

## 2019-05-13 DIAGNOSIS — R05 Cough: Secondary | ICD-10-CM

## 2019-05-13 DIAGNOSIS — Z96652 Presence of left artificial knee joint: Secondary | ICD-10-CM | POA: Insufficient documentation

## 2019-05-13 DIAGNOSIS — Z7982 Long term (current) use of aspirin: Secondary | ICD-10-CM | POA: Insufficient documentation

## 2019-05-13 DIAGNOSIS — Z87891 Personal history of nicotine dependence: Secondary | ICD-10-CM | POA: Insufficient documentation

## 2019-05-13 DIAGNOSIS — U071 COVID-19: Secondary | ICD-10-CM | POA: Diagnosis not present

## 2019-05-13 DIAGNOSIS — R509 Fever, unspecified: Secondary | ICD-10-CM | POA: Diagnosis not present

## 2019-05-13 DIAGNOSIS — I10 Essential (primary) hypertension: Secondary | ICD-10-CM | POA: Diagnosis not present

## 2019-05-13 DIAGNOSIS — Z79899 Other long term (current) drug therapy: Secondary | ICD-10-CM | POA: Insufficient documentation

## 2019-05-13 DIAGNOSIS — N189 Chronic kidney disease, unspecified: Secondary | ICD-10-CM | POA: Insufficient documentation

## 2019-05-13 DIAGNOSIS — R059 Cough, unspecified: Secondary | ICD-10-CM

## 2019-05-13 LAB — CBC
HCT: 44.1 % (ref 39.0–52.0)
Hemoglobin: 15.1 g/dL (ref 13.0–17.0)
MCH: 29.8 pg (ref 26.0–34.0)
MCHC: 34.2 g/dL (ref 30.0–36.0)
MCV: 87 fL (ref 80.0–100.0)
Platelets: 117 10*3/uL — ABNORMAL LOW (ref 150–400)
RBC: 5.07 MIL/uL (ref 4.22–5.81)
RDW: 12.3 % (ref 11.5–15.5)
WBC: 7.8 10*3/uL (ref 4.0–10.5)
nRBC: 0 % (ref 0.0–0.2)

## 2019-05-13 LAB — BASIC METABOLIC PANEL
Anion gap: 13 (ref 5–15)
BUN: 15 mg/dL (ref 8–23)
CO2: 20 mmol/L — ABNORMAL LOW (ref 22–32)
Calcium: 8.5 mg/dL — ABNORMAL LOW (ref 8.9–10.3)
Chloride: 101 mmol/L (ref 98–111)
Creatinine, Ser: 1.48 mg/dL — ABNORMAL HIGH (ref 0.61–1.24)
GFR calc Af Amer: 53 mL/min — ABNORMAL LOW (ref >60)
GFR calc non Af Amer: 46 mL/min — ABNORMAL LOW (ref >60)
Glucose, Bld: 168 mg/dL — ABNORMAL HIGH (ref 70–99)
Potassium: 3.9 mmol/L (ref 3.5–5.1)
Sodium: 134 mmol/L — ABNORMAL LOW (ref 135–145)

## 2019-05-13 LAB — TROPONIN I (HIGH SENSITIVITY)
Troponin I (High Sensitivity): 14 ng/L (ref <18)
Troponin I (High Sensitivity): 15 ng/L (ref <18)

## 2019-05-13 MED ORDER — ALBUTEROL SULFATE HFA 108 (90 BASE) MCG/ACT IN AERS: 2.0000 | INHALATION_SPRAY | Freq: Four times a day (QID) | RESPIRATORY_TRACT | 0 refills | Status: DC | PRN

## 2019-05-13 MED ORDER — SODIUM CHLORIDE 0.9% FLUSH: 3.0000 mL | Freq: Once | INTRAVENOUS | Status: DC

## 2019-05-13 NOTE — Telephone Encounter (Signed)
Noted. Thanks.

## 2019-05-13 NOTE — Telephone Encounter (Signed)
I spoke with pt; temp today 97.6;the tooth pain has not changed; no swelling, redness on outside of face but still red at gum where tooth is; no drainage from gum. No swelling or pain on sides of neck. [facility] [place] [facility] on [facility]/22/19 and take care of tooth at that time. Pt taking tylenol 650mg  one tab which did reduce the pain significantly. Pt said he feels like a different person this morning. Pt will cb if condition changes or worsens. FYI to Dr Damita Dunnings.

## 2019-05-13 NOTE — Telephone Encounter (Signed)
Pt had a virtual with Dr Einar Pheasant on 05/12/19 which appears was after pts last correspondence on pt message; do you still want me to triage pt? Thank you.

## 2019-05-13 NOTE — Telephone Encounter (Signed)
See telephone note.

## 2019-05-13 NOTE — Telephone Encounter (Signed)
I didn't see that visit prior to routing to you.   Please get update on patient since he is starting abx and let me know.  Thanks.

## 2019-05-13 NOTE — ED Triage Notes (Signed)
Pt c/o cough and SOB and fever last night "that broke this morning." covid positive on 11th per pt. Pt also reports tooth access.  Pt aox4, nad noted.

## 2019-05-13 NOTE — Telephone Encounter (Signed)
Please triage patient about his tooth/fever.  See my chart message.  Thanks.

## 2019-05-13 NOTE — ED Provider Notes (Signed)
[facility] Emergency Department Provider Note    First MD Initiated Contact with Patient 05/13/19 1825     (approximate)  I have reviewed the triage vital signs and the nursing notes.   HISTORY  Chief Complaint Cough    HPI David Mcgee is a 73 y.o. male presents the ER for evaluation of persistent cough as well as recent fevers after recently being diagnosed with COVID-19.  States he otherwise has been feeling quite well.  Was brought to the ER because wife due to his persistent cough.  States he is otherwise been feeling improved.  Was having nausea several days ago but that is since subsided.  Denies any exertional dyspnea.  Is currently on amoxicillin for dental abscess which is causing some pain and he thinks is also contributing his fevers but he feels like that is also improving.  Has outpatient follow-up with dentistry at the [place].  Denies any chest pain or pressure at this time.    Past Medical History:  Diagnosis Date  . Arthritis    back, knees, hands - OA  . Cancer (HCC)    Skin, not melanoma  . Chronic kidney disease    loss of kidney function - L - since 1990's, h/o of renal calculi. 45% total function on right  . Depression    ? panic attacks, takes sertraline & also sees psych q 3 months   . GERD (gastroesophageal reflux disease)   . H/O hiatal hernia   . History of stress test    long time ago- wnl  . Hypertension   . Insomnia   . Palpitations   . PTSD (post-traumatic stress disorder)    post [place]  . Spinal cord cysts    Family History  Problem Relation Age of Onset  . Hypertension Mother   . Diabetes Mother   . Kidney failure Father   . Hypertension Father   . Obesity Sister   . Diabetes Sister   . Diabetes Brother   . Hypertension Brother   . Hyperlipidemia Brother   . Stroke Brother   . Depression Brother        ?  . Colon cancer Neg Hx   . Prostate cancer Neg Hx    Past Surgical History:  Procedure Laterality  Date  . BACK SURGERY    . ESOPHAGOGASTRODUODENOSCOPY  06/1995   Dilitation bx negative (Dr. Epifanio Lesches)  . Hernoiorraphy     Right; years ago  . JOINT REPLACEMENT Bilateral   . KNEE ARTHROPLASTY Right 08/18/2015   Procedure: COMPUTER ASSISTED TOTAL KNEE ARTHROPLASTY;  Surgeon: Dereck Leep, MD;  Location: [facility] ORS;  Service: Orthopedics;  Laterality: Right;  . KNEE ARTHROSCOPY  10/2000   Right Knee (Dr. Sabra Heck)  . LUMBAR LAMINECTOMY/DECOMPRESSION MICRODISCECTOMY Right 12/09/2012   Procedure: LUMBAR LAMINECTOMY/DECOMPRESSION MICRODISCECTOMY 1 LEVEL;  Surgeon: Elaina Hoops, MD;  Location: [place] [facility];  Service: Neurosurgery;  Laterality: Right;  LUMBAR LAMINECTOMY/DECOMPRESSION MICRODISCECTOMY 1 LEVEL  . MOHS SURGERY  01/26/2017   nose  . NEUROMA SURGERY Right 2000   Right foot Morton's neuroma excision  . SHOULDER ARTHROSCOPY WITH SUBACROMIAL DECOMPRESSION AND BICEP TENDON REPAIR Left 07/04/2018   Procedure: SHOULDER ARTHROSCOPY WITH DEBRIDEMENT, DECOMPRESSION, REPAIR OF PARTIAL THICKNESS ROTATOR CUFF TEAR, BICEP TENODESIS WITH POSSIBLE DISTAL CLAVICLE EXCISION;  Surgeon: Corky Mull, MD;  Location: [facility] ORS;  Service: Orthopedics;  Laterality: Left;  . TONSILLECTOMY    . TOTAL KNEE ARTHROPLASTY Bilateral 2013, 2016   Patient  Active Problem List   Diagnosis Date Noted  . Nausea 10/20/2018  . Dysuria 10/14/2018  . Shoulder pain 07/02/2018  . Health care maintenance 03/29/2017  . Insomnia 03/29/2017  . BPH (benign prostatic hyperplasia) 03/29/2017  . 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  . Advance care planning 12/09/2014  . Elevated serum creatinine 12/09/2014  . Hyperglycemia 12/09/2014  . Atypical chest pain 02/23/2014  . PTSD (post-traumatic stress disorder) 01/24/2013  . Hyperlipidemia 11/16/2011  . Medicare annual wellness visit, subsequent 10/24/2011  . PALPITATIONS 06/10/2009  . LOW BACK PAIN, ACUTE 01/01/2009  . CARCINOMA, BASAL CELL 10/01/2008  . 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 HYPERTRIGLYCERIDEMIA 10/03/2007  . Essential hypertension 10/03/2007  . GERD 10/03/2007      Prior to Admission medications   Medication Sig Start Date End Date Taking? Authorizing Provider  albuterol (VENTOLIN HFA) 108 (90 Base) MCG/ACT inhaler Inhale 2 puffs into the lungs every 6 (six) hours as needed for wheezing or shortness of breath. 05/13/19   Merlyn Lot, MD  amLODipine (NORVASC) 10 MG tablet TAKE 1 TABLET BY MOUTH EVERYDAY AT BEDTIME 04/03/19   Tonia Ghent, MD  ammonium lactate (AMLACTIN) 12 % cream Apply 1 g topically as needed (itchy skin).    [provider]  amoxicillin-clavulanate (AUGMENTIN) 875-125 MG tablet Take 1 tablet by mouth 2 (two) times daily. 05/12/19   Lesleigh Noe, MD  aspirin EC 81 MG tablet Take 81 mg by mouth daily.    [provider]  atorvastatin (LIPITOR) 10 MG tablet Take 10 mg by mouth daily.    [provider]  Carboxymethylcellulose Sodium (THERATEARS OP) Apply 1 drop to eye daily as needed (dry eyes).    [provider]  cholecalciferol (VITAMIN D3) 25 MCG (1000 UT) tablet Take 1,000 Units by mouth daily.    [provider]  docusate sodium (COLACE) 100 MG capsule Take 200 mg by mouth 2 (two) times daily as needed for mild constipation.    [provider]  hydrocortisone (ANUSOL-HC) 2.5 % rectal cream Place 1 application rectally daily as needed for hemorrhoids or anal itching.    [provider]  lisinopril (PRINIVIL,ZESTRIL) 5 MG tablet Take 2.5 mg by mouth daily.    [provider]  Melatonin 5 MG TABS Take 5 mg by mouth at bedtime as needed.    [provider]  Multiple Vitamin (MULTIVITAMIN) tablet Take 1 tablet by mouth daily.    [provider]  Omega-3 Fatty Acids (FISH OIL) 1000 MG CAPS Take 1,000 mg by mouth 2 (two) times daily.     [provider]  sertraline (ZOLOFT) 25 MG tablet Take 0.5 tablets (12.5 mg total) by  mouth daily as needed (mood stability). 07/01/18   Tonia Ghent, MD  tamsulosin (FLOMAX) 0.4 MG CAPS capsule Take 0.4 mg by mouth at bedtime.     [provider]    Allergies Nsaids, Azithromycin, and Trazodone and nefazodone    Social History Social History   Tobacco Use  . Smoking status: Former Smoker    Packs/day: 1.50    Years: 9.00    Pack years: 13.50    Types: Cigarettes    Quit date: 05/22/1969    Years since quitting: 50.0  . Smokeless tobacco: Never Used  . Tobacco comment: quit in 1972  Substance Use Topics  . Alcohol use: No  . Drug use: No    Review of Systems Patient denies headaches, rhinorrhea, blurry vision, numbness, shortness of  breath, chest pain, edema, cough, abdominal pain, nausea, vomiting, diarrhea, dysuria, fevers, rashes or hallucinations unless otherwise stated above in HPI. ____________________________________________   PHYSICAL EXAM:  VITAL SIGNS: Vitals:   05/13/19 1921 05/13/19 1925  BP: (!) 152/73   Pulse: 70   Resp: 16   Temp: 98.4 F (36.9 C)   SpO2: 96% 96%    Constitutional: Alert and oriented.  Eyes: Conjunctivae are normal.  Head: Atraumatic. Nose: No congestion/rhinnorhea. Mouth/Throat: Mucous membranes are moist.  Carious dentition without evidence of periapical abscess fluctuance or mass.  No drainage. Neck: No stridor. Painless ROM.  Cardiovascular: Normal rate, regular rhythm. Grossly normal heart sounds.  Good peripheral circulation. Respiratory: Normal respiratory effort.  No retractions. Lungs CTAB. Gastrointestinal: Soft and nontender. No distention. No abdominal bruits. No CVA tenderness. Genitourinary:  Musculoskeletal: No lower extremity tenderness nor edema.  No joint effusions. Neurologic:  Normal speech and language. No gross focal neurologic deficits are appreciated. No facial droop Skin:  Skin is warm, dry and intact. No rash noted. Psychiatric: Mood and affect are normal. Speech and behavior  are normal.  ____________________________________________   LABS (all labs ordered are listed, but only abnormal results are displayed)  Results for orders placed or performed during the hospital encounter of 05/13/19 (from the past 24 hour(s))  Basic metabolic panel     Status: Abnormal   Collection Time: 05/13/19  2:24 PM  Result Value Ref Range   Sodium 134 (L) 135 - 145 mmol/L   Potassium 3.9 3.5 - 5.1 mmol/L   Chloride 101 98 - 111 mmol/L   CO2 20 (L) 22 - 32 mmol/L   Glucose, Bld 168 (H) 70 - 99 mg/dL   BUN 15 8 - 23 mg/dL   Creatinine, Ser 1.48 (H) 0.61 - 1.24 mg/dL   Calcium 8.5 (L) 8.9 - 10.3 mg/dL   GFR calc non Af Amer 46 (L) >60 mL/min   GFR calc Af Amer 53 (L) >60 mL/min   Anion gap 13 5 - 15  CBC     Status: Abnormal   Collection Time: 05/13/19  2:24 PM  Result Value Ref Range   WBC 7.8 4.0 - 10.5 K/uL   RBC 5.07 4.22 - 5.81 MIL/uL   Hemoglobin 15.1 13.0 - 17.0 g/dL   HCT 44.1 39.0 - 52.0 %   MCV 87.0 80.0 - 100.0 fL   MCH 29.8 26.0 - 34.0 pg   MCHC 34.2 30.0 - 36.0 g/dL   RDW 12.3 11.5 - 15.5 %   Platelets 117 (L) 150 - 400 K/uL   nRBC 0.0 0.0 - 0.2 %  Troponin I (High Sensitivity)     Status: None   Collection Time: 05/13/19  2:24 PM  Result Value Ref Range   Troponin I (High Sensitivity) 14 <18 ng/L  Troponin I (High Sensitivity)     Status: None   Collection Time: 05/13/19  4:24 PM  Result Value Ref Range   Troponin I (High Sensitivity) 15 <18 ng/L   ____________________________________________  EKG My review and personal interpretation at Time: 19:17   Indication: cough  Rate: 70  Rhythm: sinus Axis: normal Other: normal intervals, nonspecific t wave abn, no stemi ____________________________________________  RADIOLOGY  I personally reviewed all radiographic images ordered to evaluate for the above acute complaints and reviewed radiology reports and findings.  These findings were personally discussed with the patient.  Please see medical record  for radiology report.  ____________________________________________   PROCEDURES  Procedure(s) performed:  Procedures  Critical Care performed: no ____________________________________________   INITIAL IMPRESSION / ASSESSMENT AND PLAN / ED COURSE  Pertinent labs & imaging results that were available during my care of the patient were reviewed by me and considered in my medical decision making (see chart for details).   DDX: covid 19, pna, mass, bronchitis, chf  KAIEN VADEN is a 73 y.o. who presents to the ED with symptoms as described above.  Patient well-appearing in no acute distress.  Blood work is reassuring.  Chest x-ray without infiltrate or edema.  No mass.  Suspect symptoms secondary to lingering COVID-19 infection.  He otherwise feels well.  Will give albuterol to see if that helps with his cough.  Do not feel patient requires any further diagnostic testing or admission the hospital at this time given his well appearance.  Discussed signs and symptoms for which she should return to the ER.     The patient was evaluated in Emergency Department today for the symptoms described in the history of present illness. He/she was evaluated in the context of the global COVID-19 pandemic, which necessitated consideration that the patient might be at risk for infection with the SARS-CoV-2 virus that causes COVID-19. Institutional protocols and algorithms that pertain to the evaluation of patients at risk for COVID-19 are in a state of rapid change based on information released by regulatory bodies including the CDC and federal and state organizations. These policies and algorithms were followed during the patient's care in the ED.  As part of my medical decision making, I reviewed the following data within the [place] notes reviewed and incorporated, Labs reviewed, notes from prior ED visits and [place] Controlled Substance  Database   ____________________________________________   FINAL CLINICAL IMPRESSION(S) / ED DIAGNOSES  Final diagnoses:  Cough  COVID-19 virus infection      NEW MEDICATIONS STARTED DURING THIS VISIT:  Discharge Medication List as of 05/13/2019  7:02 PM    START taking these medications   Details  albuterol (VENTOLIN HFA) 108 (90 Base) MCG/ACT inhaler Inhale 2 puffs into the lungs every 6 (six) hours as needed for wheezing or shortness of breath., Starting Tue 05/13/2019, Normal         Note:  This document was prepared using Dragon voice recognition software and may include unintentional dictation errors.    Merlyn Lot, MD 05/13/19 [id number]

## 2019-08-21 DIAGNOSIS — M170 Bilateral primary osteoarthritis of knee: Secondary | ICD-10-CM | POA: Diagnosis not present

## 2019-08-21 DIAGNOSIS — Z96653 Presence of artificial knee joint, bilateral: Secondary | ICD-10-CM | POA: Diagnosis not present

## 2019-09-15 ENCOUNTER — Telehealth (INDEPENDENT_AMBULATORY_CARE_PROVIDER_SITE_OTHER): Payer: PPO | Admitting: Family Medicine

## 2019-09-15 ENCOUNTER — Encounter: Payer: Self-pay | Admitting: Family Medicine

## 2019-09-15 ENCOUNTER — Other Ambulatory Visit: Payer: Self-pay

## 2019-09-15 DIAGNOSIS — J22 Unspecified acute lower respiratory infection: Secondary | ICD-10-CM | POA: Diagnosis not present

## 2019-09-15 MED ORDER — DOXYCYCLINE HYCLATE 100 MG PO TABS: 100.0000 mg | ORAL_TABLET | Freq: Two times a day (BID) | ORAL | 0 refills | Status: DC

## 2019-09-15 MED ORDER — CHERATUSSIN AC 100-10 MG/5ML PO SOLN: 5.0000 mL | Freq: Two times a day (BID) | ORAL | 0 refills | Status: DC | PRN

## 2019-09-15 NOTE — Assessment & Plan Note (Addendum)
Anticipate viral URI leading to viral bronchitis. Supportive care reviewed - continue tylenol, add cheratussin codeine cough syrup, may use left over albuterol PRN.  Provided with [facility] for doxycycline with indications when to fill (signs of bacterial super-infection). Reviewed red flags to seek urgent in-person care including dyspnea, signs of cyanosis or worsening cough, new spiking fever.  Doubt covid given recent COVID infection 04/2019 and he completed COVID vaccination series 05/2019 - should still be well immunized at this time (3 mo later).

## 2019-09-15 NOTE — Progress Notes (Signed)
Virtual visit completed through [facility]. Due to national recommendations of social distancing due to COVID-19, a virtual visit is felt to be most appropriate for this patient at this time. Reviewed limitations of a virtual visit.   Patient location: home Provider location: [place] at [facility], office If any vitals were documented, they were collected by patient at home unless specified below.    BP (!) 159/83   Pulse 70   Temp 97.8 F (36.6 C)   Ht 6\' 3"  (1.905 m)   Wt 224 lb (101.6 kg)   SpO2 96%   BMI 28.00 kg/m    CC: cough, congestion Subjective:    Patient ID: David Mcgee, male    DOB: 1946-01-28, 74 y.o.   MRN: [id number]  HPI: David Mcgee is a 74 y.o. male presenting on 09/15/2019 for Cough (C/o productive cough with mucous, chest congestion, nasal congestion and hoarseness.   Worse during day.  Sxs started about 1 wk ago.  Tried Mucinex and guaifenesin, helpful.  Concerned about taking anything else due to HTN. )   1+ wk h/o cough, congestion, hoarse voice. Coughing up colored green mucous. Now with hoarse voice. Symptoms started with sore throat.  Feels better at night but worse in the afternoons. Hacking cough sounds like rattling in chest. Head > chest congestion. Coughing fits.   No fevers/chills, dyspnea, ear or tooth pain. No headache, sinus pressure pain.   Treating with guaifenesin, tylenol with benefit.  No sick contacts at home.  No smokers at home. Ex smoker - remotely.  No h/o asthma.   He did have covid-19 infection 04/2019.  He did complete COVID Pfizer vaccines 05/2019.      Relevant past medical, surgical, family and social history reviewed and updated as indicated. Interim medical history since our last visit reviewed. Allergies and medications reviewed and updated. Outpatient Medications Prior to Visit  Medication Sig Dispense Refill  . albuterol (VENTOLIN HFA) 108 (90 Base) MCG/ACT inhaler Inhale 2 puffs into the lungs every 6 (six) hours as  needed for wheezing or shortness of breath. 8 g 0  . amLODipine (NORVASC) 10 MG tablet TAKE 1 TABLET BY MOUTH EVERYDAY AT BEDTIME 90 tablet 0  . ammonium lactate (AMLACTIN) 12 % cream Apply 1 g topically as needed (itchy skin).    Marland Kitchen aspirin EC 81 MG tablet Take 81 mg by mouth daily.    Marland Kitchen atorvastatin (LIPITOR) 10 MG tablet Take 10 mg by mouth daily.    . Carboxymethylcellulose Sodium (THERATEARS OP) Apply 1 drop to eye daily as needed (dry eyes).    . cholecalciferol (VITAMIN D3) 25 MCG (1000 UT) tablet Take 1,000 Units by mouth daily.    Marland Kitchen docusate sodium (COLACE) 100 MG capsule Take 200 mg by mouth 2 (two) times daily as needed for mild constipation.    . hydrocortisone (ANUSOL-HC) 2.5 % rectal cream Place 1 application rectally daily as needed for hemorrhoids or anal itching.    Marland Kitchen lisinopril (PRINIVIL,ZESTRIL) 5 MG tablet Take 2.5 mg by mouth daily.    . Melatonin 5 MG TABS Take 5 mg by mouth at bedtime as needed.    . Multiple Vitamin (MULTIVITAMIN) tablet Take 1 tablet by mouth daily.    . Omega-3 Fatty Acids (FISH OIL) 1000 MG CAPS Take 1,000 mg by mouth 2 (two) times daily.     . sertraline (ZOLOFT) 25 MG tablet Take 0.5 tablets (12.5 mg total) by mouth daily as needed (mood stability).    Marland Kitchen  tamsulosin (FLOMAX) 0.4 MG CAPS capsule Take 0.4 mg by mouth at bedtime.     Marland Kitchen amoxicillin-clavulanate (AUGMENTIN) 875-125 MG tablet Take 1 tablet by mouth 2 (two) times daily. 20 tablet 0   No facility-administered medications prior to visit.     Per HPI unless specifically indicated in ROS section below Review of Systems Objective:    BP (!) 159/83   Pulse 70   Temp 97.8 F (36.6 C)   Ht 6\' 3"  (1.905 m)   Wt 224 lb (101.6 kg)   SpO2 96%   BMI 28.00 kg/m   Wt Readings from Last 3 Encounters:  09/15/19 224 lb (101.6 kg)  05/13/19 220 lb (99.8 kg)  04/08/19 225 lb 9 oz (102.3 kg)     Physical exam: Gen: alert, NAD, not ill appearing HEENT: sounds congested Pulm: speaks in complete  sentences without increased work of breathing Psych: normal mood, normal thought content      Assessment & Plan:   Problem List Items Addressed This Visit    Acute respiratory infection    Anticipate viral URI leading to viral bronchitis. Supportive care reviewed - continue tylenol, add cheratussin codeine cough syrup, may use left over albuterol PRN.  Provided with [facility] for doxycycline with indications when to fill (signs of bacterial super-infection). Reviewed red flags to seek urgent in-person care including dyspnea, signs of cyanosis or worsening cough, new spiking fever.  Doubt covid given recent COVID infection 04/2019 and he completed COVID vaccination series 05/2019 - should still be well immunized at this time (3 mo later).          Meds ordered this encounter  Medications  . guaiFENesin-codeine (CHERATUSSIN AC) 100-10 MG/5ML syrup    Sig: Take 5 mLs by mouth 2 (two) times daily as needed for cough.    Dispense:  120 mL    Refill:  0  . doxycycline (VIBRA-TABS) 100 MG tablet    Sig: Take 1 tablet (100 mg total) by mouth 2 (two) times daily.    Dispense:  14 tablet    Refill:  0   No orders of the defined types were placed in this encounter.   I discussed the assessment and treatment plan with the patient. The patient was provided an opportunity to ask questions and all were answered. The patient agreed with the plan and demonstrated an understanding of the instructions. The patient was advised to call back or seek an in-person evaluation if the symptoms worsen or if the condition fails to improve as anticipated.  Follow up plan: Return if symptoms worsen or fail to improve.  Ria Bush, MD

## 2020-01-02 DIAGNOSIS — M25552 Pain in left hip: Secondary | ICD-10-CM | POA: Diagnosis not present

## 2020-01-02 DIAGNOSIS — M5416 Radiculopathy, lumbar region: Secondary | ICD-10-CM | POA: Diagnosis not present

## 2020-01-20 DIAGNOSIS — D485 Neoplasm of uncertain behavior of skin: Secondary | ICD-10-CM | POA: Diagnosis not present

## 2020-01-20 DIAGNOSIS — Z85828 Personal history of other malignant neoplasm of skin: Secondary | ICD-10-CM | POA: Diagnosis not present

## 2020-01-20 DIAGNOSIS — L570 Actinic keratosis: Secondary | ICD-10-CM | POA: Diagnosis not present

## 2020-01-20 DIAGNOSIS — L578 Other skin changes due to chronic exposure to nonionizing radiation: Secondary | ICD-10-CM | POA: Diagnosis not present

## 2020-01-20 DIAGNOSIS — C44319 Basal cell carcinoma of skin of other parts of face: Secondary | ICD-10-CM | POA: Diagnosis not present

## 2020-01-20 DIAGNOSIS — Z86018 Personal history of other benign neoplasm: Secondary | ICD-10-CM | POA: Diagnosis not present

## 2020-01-20 DIAGNOSIS — Z872 Personal history of diseases of the skin and subcutaneous tissue: Secondary | ICD-10-CM | POA: Diagnosis not present

## 2020-01-20 DIAGNOSIS — C44619 Basal cell carcinoma of skin of left upper limb, including shoulder: Secondary | ICD-10-CM | POA: Diagnosis not present

## 2020-02-17 ENCOUNTER — Telehealth: Payer: Self-pay | Admitting: *Deleted

## 2020-02-17 NOTE — Telephone Encounter (Signed)
Needs eval either here or if worsening at [facility] tonight.  I get his concern and he needs in person eval.  I would like to see patient but I am not in clinic tomorrow.

## 2020-02-17 NOTE — Telephone Encounter (Signed)
Patient called stating that he is having some issues with his kidneys. Patient stated that he started about 2 weeks ago having some nausea off and on. Patient stated that he is having some pressure before and after urinating and a discharge.. Patient stated that he only has one kidney and he is concerned about the symptoms that he has started having. Patient stated at one time he had some pain that felt like he may have a kidney stone. Patient stated that he has a history of kidney stones. Patient stated that he has had some chills..Patient stated that he has surgery scheduled in the morning to have skin cancer removed from his shoulder. Patient denies any covid symptoms other than chills and nausea. Patient stated that he had covid last year and has had both of his covid vaccine.

## 2020-02-17 NOTE — Telephone Encounter (Signed)
Patient advised.

## 2020-02-18 ENCOUNTER — Other Ambulatory Visit: Payer: Self-pay

## 2020-02-18 ENCOUNTER — Ambulatory Visit (INDEPENDENT_AMBULATORY_CARE_PROVIDER_SITE_OTHER): Payer: PPO | Admitting: Family Medicine

## 2020-02-18 ENCOUNTER — Encounter: Payer: Self-pay | Admitting: Family Medicine

## 2020-02-18 VITALS — BP 128/62 | HR 62 | Temp 98.1°F | Ht 74.0 in | Wt 225.0 lb

## 2020-02-18 DIAGNOSIS — R1032 Left lower quadrant pain: Secondary | ICD-10-CM | POA: Diagnosis not present

## 2020-02-18 DIAGNOSIS — R300 Dysuria: Secondary | ICD-10-CM | POA: Diagnosis not present

## 2020-02-18 DIAGNOSIS — R739 Hyperglycemia, unspecified: Secondary | ICD-10-CM | POA: Diagnosis not present

## 2020-02-18 DIAGNOSIS — C4491 Basal cell carcinoma of skin, unspecified: Secondary | ICD-10-CM | POA: Diagnosis not present

## 2020-02-18 DIAGNOSIS — N481 Balanitis: Secondary | ICD-10-CM | POA: Diagnosis not present

## 2020-02-18 DIAGNOSIS — C44619 Basal cell carcinoma of skin of left upper limb, including shoulder: Secondary | ICD-10-CM | POA: Diagnosis not present

## 2020-02-18 LAB — COMPREHENSIVE METABOLIC PANEL
ALT: 24 U/L (ref 0–53)
AST: 27 U/L (ref 0–37)
Albumin: 4.4 g/dL (ref 3.5–5.2)
Alkaline Phosphatase: 49 U/L (ref 39–117)
BUN: 16 mg/dL (ref 6–23)
CO2: 29 mEq/L (ref 19–32)
Calcium: 9.5 mg/dL (ref 8.4–10.5)
Chloride: 102 mEq/L (ref 96–112)
Creatinine, Ser: 1.5 mg/dL (ref 0.40–1.50)
GFR: 45.57 mL/min — ABNORMAL LOW (ref >60.00)
Glucose, Bld: 108 mg/dL — ABNORMAL HIGH (ref 70–99)
Potassium: 4.2 mEq/L (ref 3.5–5.1)
Sodium: 136 mEq/L (ref 135–145)
Total Bilirubin: 0.9 mg/dL (ref 0.2–1.2)
Total Protein: 7.5 g/dL (ref 6.0–8.3)

## 2020-02-18 LAB — CBC WITH DIFFERENTIAL/PLATELET
Basophils Absolute: 0 10*3/uL (ref 0.0–0.1)
Basophils Relative: 0.6 % (ref 0.0–3.0)
Eosinophils Absolute: 0.1 10*3/uL (ref 0.0–0.7)
Eosinophils Relative: 1.4 % (ref 0.0–5.0)
HCT: 46.4 % (ref 39.0–52.0)
Hemoglobin: 15.9 g/dL (ref 13.0–17.0)
Lymphocytes Relative: 31.1 % (ref 12.0–46.0)
Lymphs Abs: 2.1 10*3/uL (ref 0.7–4.0)
MCHC: 34.3 g/dL (ref 30.0–36.0)
MCV: 90 fl (ref 78.0–100.0)
Monocytes Absolute: 0.6 10*3/uL (ref 0.1–1.0)
Monocytes Relative: 9.7 % (ref 3.0–12.0)
Neutro Abs: 3.8 10*3/uL (ref 1.4–7.7)
Neutrophils Relative %: 57.2 % (ref 43.0–77.0)
Platelets: 159 10*3/uL (ref 150.0–400.0)
RBC: 5.16 Mil/uL (ref 4.22–5.81)
RDW: 13.1 % (ref 11.5–15.5)
WBC: 6.6 10*3/uL (ref 4.0–10.5)

## 2020-02-18 LAB — POC URINALSYSI DIPSTICK (AUTOMATED)
Bilirubin, UA: POSITIVE
Blood, UA: NEGATIVE
Glucose, UA: NEGATIVE
Ketones, UA: NEGATIVE
Leukocytes, UA: NEGATIVE
Nitrite, UA: NEGATIVE
Protein, UA: POSITIVE — AB
Spec Grav, UA: >=1.03 — AB (ref 1.010–1.025)
Urobilinogen, UA: NEGATIVE E.U./dL — AB
pH, UA: 6 (ref 5.0–8.0)

## 2020-02-18 LAB — HEMOGLOBIN A1C: Hgb A1c MFr Bld: 6 % (ref 4.6–6.5)

## 2020-02-18 NOTE — Patient Instructions (Signed)
Good to see you today  I will notify you of lab results through [facility]  I think you have an inflammation of the head of your penis, please get some over the counter clotrimazole and some normal saline.   To treat, please retract your foreskin twice a day and rinse with normal saline. Pat dry and apply small amount of clotrimazole cream. Please do this for 7 days. If any worsening symptoms, new symptoms, please follow up.  Balanitis  Balanitis is swelling and irritation (inflammation) of the head of the penis (glans penis). The condition may also cause inflammation of the skin around the glans penis (foreskin) in men who have not been circumcised. It may develop because of an infection or another medical condition. Balanitis occurs most often among men who have not had their foreskin removed (uncircumcised men). Balanitis sometimes causes scarring of the penis or foreskin, which can require surgery. Untreated balanitis can increase the risk of penile cancer. What are the causes? Common causes of this condition include:  Poor personal hygiene, especially in uncircumcised men. Not cleaning the glans penis and foreskin well can result in buildup of bacteria, viruses, and yeast, which can lead to infection and inflammation.  Irritation and lack of air flow due to fluid (smegma) that can build up on the glans penis. Other causes include:  Chemical irritation from products such as soaps or shower gels (especially those that have fragrance), condoms, personal lubricants, petroleum jelly, spermicides, or fabric softeners.  Skin conditions, such as eczema, dermatitis, and psoriasis.  Allergies to medicines, such as tetracycline and sulfa drugs.  Certain medical conditions, including liver cirrhosis, congestive heart failure, diabetes, and kidney disease.  Infections, such as candidiasis, HPV (human papillomavirus), herpes simplex, gonorrhea, and syphilis.  Severe obesity. What increases the  risk? The following factors may make you more likely to develop this condition:  Having diabetes. This is the most common risk factor.  Having a tight foreskin that is difficult to pull back (retract) past the glans.  Having sexual intercourse without using a condom. What are the signs or symptoms? Symptoms of this condition include:  Discharge from under the foreskin.  A bad smell.  Pain or difficulty retracting the foreskin.  Tenderness, redness, and swelling of the glans.  A rash or sores on the glans or foreskin.  Itchiness.  Inability to get an erection due to pain.  Difficulty urinating.  Scarring of the penis or foreskin, in some cases. How is this diagnosed? This condition may be diagnosed based on:  A physical exam.  Testing a swab of discharge to check for bacterial or fungal infection.  Blood tests: ? To check for viruses that can cause balanitis. ? To check your blood sugar (glucose) level. High blood glucose could be a sign of diabetes, which can cause balanitis. How is this treated? Treatment for balanitis depends on the cause. Treatment may include:  Improving personal hygiene. Your health care provider may recommend sitting in a bath of warm water that is deep enough to cover your hips and buttocks (sitz bath).  Medicines such as: ? Creams or ointments to reduce swelling (steroids) or to treat an infection. ? Antibiotic medicine. ? Antifungal medicine.  Surgery to remove or cut the foreskin (circumcision). This may be done if you have scarring on the foreskin that makes it difficult to retract.  Controlling other medical problems that may be causing your condition or making it worse. Follow these instructions at home:  Do not have  sex until the condition clears up, or until your health care provider approves.  Keep your penis clean and dry. Take sitz baths as recommended by your health care provider.  Avoid products that irritate your skin or  make symptoms worse, such as soaps and shower gels that have fragrance.  Take over-the-counter and prescription medicines only as told by your health care provider. ? If you were prescribed an antibiotic medicine or a cream or ointment, use it as told by your health care provider. Do not stop using your medicine, cream, or ointment even if you start to feel better. ? Do not drive or use heavy machinery while taking prescription pain medicine. Contact a health care provider if:  Your symptoms get worse or do not improve with home care.  You develop chills or a fever.  You have trouble urinating.  You cannot retract your foreskin. Get help right away if:  You develop severe pain.  You are unable to urinate. Summary  Balanitis is inflammation of the head of the penis (glans penis) caused by irritation or infection.  Balanitis causes pain, redness, and swelling of the glans penis.  This condition is most common among uncircumcised men who do not keep their glans penis clean and in men who have diabetes.  Treatment may include creams or ointments.  Good hygiene is important for prevention. This includes pulling back the foreskin when washing your penis. This information is not intended to replace advice given to you by your health care provider. Make sure you discuss any questions you have with your health care provider. Document Revised: 04/20/2017 Document Reviewed: 03/27/2016 Elsevier Patient Education  2020 [employer].

## 2020-02-18 NOTE — Progress Notes (Signed)
Subjective:    Patient ID: David Mcgee, male    DOB: Jul 10, 1945, 73 y.o.   MRN: [id number]  HPI Chief Complaint  Patient presents with  . Dysuria    x1 wk itching  . Abdominal Pain    x1wk left side  . Nausea    no vomiting   This is a 74 yo male who presents today with burning with urination and penile itching x 1 week. Nocturia x 2, no urinary frequency. Some discharge of penis x months. White/ yellow. Notices in his underwear. Not sexually active. Drinks 2-3 glasses water, 2-3 glasses tea, 2-3 decaf, diet Pepsi.    Had a sharp pain in groin 2 weeks ago, no pain since.   Nausea off and on, worse at night. No vomiting, feels uncomfortable, like he has been hit in his testicular area. Takes flomax for BPH, occasionally has weak stream. No fever/ chills. No recent diarrhea or constipation, no heartburn.    Review of Systems Per HPI    Objective:   Physical Exam Vitals reviewed. Exam conducted with a chaperone present.  Constitutional:      General: He is not in acute distress.    Appearance: He is well-developed and normal weight. He is not ill-appearing, toxic-appearing or diaphoretic.  HENT:     Head: Normocephalic and atraumatic.  Eyes:     Conjunctiva/sclera: Conjunctivae normal.  Cardiovascular:     Rate and Rhythm: Normal rate.  Pulmonary:     Effort: Pulmonary effort is normal.  Abdominal:     General: Abdomen is flat. There is no distension.     Palpations: There is no mass.     Tenderness: There is no abdominal tenderness. There is no guarding or rebound.     Hernia: No hernia is present.  Genitourinary:    Pubic Area: No rash or pubic lice.      Penis: Uncircumcised. Erythema and tenderness present. No discharge.      Testes: Normal.     Prostate: Normal.     Rectum: No mass, tenderness or external hemorrhoid.     Comments: Foreskin retracted, tender (per patient- always tender for as long as he can remember), mild erythema, no discharge, urine leakage  with foreskin retraction.  Skin:    General: Skin is warm and dry.     Findings: Lesion (singular erythematous papule left, medial thigh.) present.  Neurological:     Mental Status: He is alert and oriented to person, place, and time.  Psychiatric:        Mood and Affect: Mood normal.        Behavior: Behavior normal.        Thought Content: Thought content normal.        Judgment: Judgment normal.       BP 128/62   Pulse 62   Temp 98.1 F (36.7 C) (Temporal)   Ht 6\' 2"  (1.88 m)   Wt 225 lb (102.1 kg)   SpO2 97%   BMI 28.89 kg/m  Wt Readings from Last 3 Encounters:  02/18/20 225 lb (102.1 kg)  09/15/19 224 lb (101.6 kg)  05/13/19 220 lb (99.8 kg)   Results for orders placed or performed in visit on 02/18/20  POCT Urinalysis Dipstick (Automated)  Result Value Ref Range   Color, UA yellow    Clarity, UA clear    Glucose, UA Negative Negative   Bilirubin, UA positive    Ketones, UA negative    Spec Grav, UA >=  1.030 (A) 1.010 - 1.025   Blood, UA negative    pH, UA 6.0 5.0 - 8.0   Protein, UA Positive (A) Negative   Urobilinogen, UA negative (A) 0.2 or 1.0 E.U./dL   Nitrite, UA negative    Leukocytes, UA Negative Negative       Assessment & Plan:  1. Dysuria - suspect related to balanitis, but will check urine culture to rule out infection - POCT Urinalysis Dipstick (Automated) - Urine Culture - CBC with Differential - Comprehensive metabolic panel  2. Left inguinal pain - resolved - CBC with Differential - Comprehensive metabolic panel  3. Elevated blood sugar - noted on previous labs, will check for diabetes - Hemoglobin A1c  4. Balanitis - Provided written and verbal information regarding diagnosis and treatment. - he was instructed to clean area with normal saline and apply clotrimazole bid x 7 days - follow up precautions reviewed This visit occurred during the SARS-CoV-2 public health emergency.  Safety protocols were in place, including screening  questions prior to the visit, additional usage of staff PPE, and extensive cleaning of exam room while observing appropriate contact time as indicated for disinfecting solutions.      Clarene Reamer, FNP-BC  [facility]  02/18/2020 10:15 AM

## 2020-02-19 LAB — URINE CULTURE
MICRO NUMBER:: 11010346
Result:: NO GROWTH
SPECIMEN QUALITY:: ADEQUATE

## 2020-04-08 ENCOUNTER — Ambulatory Visit: Payer: PPO

## 2020-04-30 ENCOUNTER — Ambulatory Visit (INDEPENDENT_AMBULATORY_CARE_PROVIDER_SITE_OTHER): Payer: PPO

## 2020-04-30 ENCOUNTER — Telehealth: Payer: Self-pay

## 2020-04-30 ENCOUNTER — Other Ambulatory Visit: Payer: Self-pay

## 2020-04-30 DIAGNOSIS — Z0000 Encounter for general adult medical examination without abnormal findings: Secondary | ICD-10-CM | POA: Diagnosis not present

## 2020-04-30 NOTE — Progress Notes (Signed)
Subjective:   David Mcgee is a 74 y.o. male who presents for Medicare Annual/Subsequent preventive examination.  Review of Systems: N/A      I connected with the patient today by telephone and verified that I am speaking with the correct person using two identifiers. Location patient: home Location nurse: work Persons participating in the telephone visit: patient, nurse.   I discussed the limitations, risks, security and privacy concerns of performing an evaluation and management service by telephone and the availability of in person appointments. I also discussed with the patient that there may be a patient responsible charge related to this service. The patient expressed understanding and verbally consented to this telephonic visit.        Cardiac Risk Factors include: advanced age (>72men, >43 women);male gender;hypertension;Other (see comment), Risk factor comments: hyperlipidemia     Objective:    Today's Vitals   04/30/20 1353  PainSc: 10-Worst pain ever   There is no height or weight on file to calculate BMI.  Advanced Directives 04/30/2020 05/13/2019 05/13/2019 07/04/2018 06/27/2018 03/29/2018 03/21/2017  Does Patient Have a Medical Advance Directive? Yes No No Yes Yes Yes Yes  Type of Paramedic of [place];Living will - - [place];Living will Living will;Healthcare Power of [place];Living will [place];Living will  Does patient want to make changes to medical advance directive? - - - No - Patient declined No - Patient declined - -  Copy of [place] in Chart? No - copy requested - - No - copy requested No - copy requested No - copy requested No - copy requested  Would patient like information on creating a medical advance directive? - No - Patient declined No - Patient declined - - - -  Pre-existing out of facility DNR order (yellow form or pink MOST form) - - -  - - - -    Current Medications (verified) Outpatient Encounter Medications as of 04/30/2020  Medication Sig  . amLODipine (NORVASC) 10 MG tablet TAKE 1 TABLET BY MOUTH EVERYDAY AT BEDTIME  . ammonium lactate (AMLACTIN) 12 % cream Apply 1 g topically as needed (itchy skin).  Marland Kitchen aspirin EC 81 MG tablet Take 81 mg by mouth daily.  Marland Kitchen atorvastatin (LIPITOR) 10 MG tablet Take 10 mg by mouth daily.  . Carboxymethylcellulose Sodium (THERATEARS OP) Apply 1 drop to eye daily as needed (dry eyes).  . cholecalciferol (VITAMIN D3) 25 MCG (1000 UT) tablet Take 1,000 Units by mouth daily.  Marland Kitchen docusate sodium (COLACE) 100 MG capsule Take 200 mg by mouth 2 (two) times daily as needed for mild constipation.  Marland Kitchen doxycycline (VIBRA-TABS) 100 MG tablet Take 1 tablet (100 mg total) by mouth 2 (two) times daily.  Marland Kitchen guaiFENesin-codeine (CHERATUSSIN AC) 100-10 MG/5ML syrup Take 5 mLs by mouth 2 (two) times daily as needed for cough.  . hydrocortisone (ANUSOL-HC) 2.5 % rectal cream Place 1 application rectally daily as needed for hemorrhoids or anal itching.  Marland Kitchen lisinopril (PRINIVIL,ZESTRIL) 5 MG tablet Take 2.5 mg by mouth daily.  . Melatonin 5 MG TABS Take 5 mg by mouth at bedtime as needed.  . Multiple Vitamin (MULTIVITAMIN) tablet Take 1 tablet by mouth daily.  . Omega-3 Fatty Acids (FISH OIL) 1000 MG CAPS Take 1,000 mg by mouth 2 (two) times daily.   . sertraline (ZOLOFT) 25 MG tablet Take 0.5 tablets (12.5 mg total) by mouth daily as needed (mood stability).  Marland Kitchen  tamsulosin (FLOMAX) 0.4 MG CAPS capsule Take 0.4 mg by mouth at bedtime.   Marland Kitchen albuterol (VENTOLIN HFA) 108 (90 Base) MCG/ACT inhaler Inhale 2 puffs into the lungs every 6 (six) hours as needed for wheezing or shortness of breath. (Patient not taking: Reported on 04/30/2020)   No facility-administered encounter medications on file as of 04/30/2020.    Allergies (verified) Nsaids, Augmentin [amoxicillin-pot clavulanate], Azithromycin, and Trazodone and  nefazodone   History: Past Medical History:  Diagnosis Date  . Arthritis    back, knees, hands - OA  . Cancer (HCC)    Skin, not melanoma  . Chronic kidney disease    loss of kidney function - L - since 1990's, h/o of renal calculi. 45% total function on right  . Depression    ? panic attacks, takes sertraline & also sees psych q 3 months   . GERD (gastroesophageal reflux disease)   . H/O hiatal hernia   . History of stress test    long time ago- wnl  . Hypertension   . Insomnia   . Palpitations   . PTSD (post-traumatic stress disorder)    post [place]  . Spinal cord cysts    Past Surgical History:  Procedure Laterality Date  . BACK SURGERY    . ESOPHAGOGASTRODUODENOSCOPY  06/1995   Dilitation bx negative (Dr. Epifanio Lesches)  . Hernoiorraphy     Right; years ago  . JOINT REPLACEMENT Bilateral   . KNEE ARTHROPLASTY Right 08/18/2015   Procedure: COMPUTER ASSISTED TOTAL KNEE ARTHROPLASTY;  Surgeon: Dereck Leep, MD;  Location: [facility] ORS;  Service: Orthopedics;  Laterality: Right;  . KNEE ARTHROSCOPY  10/2000   Right Knee (Dr. Sabra Heck)  . LUMBAR LAMINECTOMY/DECOMPRESSION MICRODISCECTOMY Right 12/09/2012   Procedure: LUMBAR LAMINECTOMY/DECOMPRESSION MICRODISCECTOMY 1 LEVEL;  Surgeon: Elaina Hoops, MD;  Location: [place] [facility];  Service: Neurosurgery;  Laterality: Right;  LUMBAR LAMINECTOMY/DECOMPRESSION MICRODISCECTOMY 1 LEVEL  . MOHS SURGERY  01/26/2017   nose  . NEUROMA SURGERY Right 2000   Right foot Morton's neuroma excision  . SHOULDER ARTHROSCOPY WITH SUBACROMIAL DECOMPRESSION AND BICEP TENDON REPAIR Left 07/04/2018   Procedure: SHOULDER ARTHROSCOPY WITH DEBRIDEMENT, DECOMPRESSION, REPAIR OF PARTIAL THICKNESS ROTATOR CUFF TEAR, BICEP TENODESIS WITH POSSIBLE DISTAL CLAVICLE EXCISION;  Surgeon: Corky Mull, MD;  Location: [facility];  Service: Orthopedics;  Laterality: Left;  . TONSILLECTOMY    . TOTAL KNEE ARTHROPLASTY Bilateral 2013, 2016   Family History  Problem Relation Age of  Onset  . Hypertension Mother   . Diabetes Mother   . Kidney failure Father   . Hypertension Father   . Obesity Sister   . Diabetes Sister   . Diabetes Brother   . Hypertension Brother   . Hyperlipidemia Brother   . Stroke Brother   . Depression Brother        ?  . Colon cancer Neg Hx   . Prostate cancer Neg Hx    Social History   Socioeconomic History  . Marital status: Married    Spouse name: joyce  . Number of children: Not on file  . Years of education: Not on file  . Highest education level: Not on file  Occupational History  . Occupation: Retired     Fish farm manager: [employer]  . Occupation: deacon at [employer]  . Smoking status: Former Smoker    Packs/day: 1.50    Years: 9.00    Pack years: 13.50    Types: Cigarettes    Quit date: 05/22/1969  Years since quitting: 50.9  . Smokeless tobacco: Never Used  . Tobacco comment: quit in 1972  Vaping Use  . Vaping Use: Never used  Substance and Sexual Activity  . Alcohol use: No  . Drug use: No  . Sexual activity: Yes  Other Topics Concern  . Not on file  Social History Narrative   Married (second marriage 1989) and lives with wife   One child, 3 grandkids (2 step and 1 biological).   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  Agent orange exposure.   Retired from [employer], Engineer, production   Social Determinants of Radio broadcast assistant Strain: [place]   . Difficulty of Paying Living Expenses: Not hard at all  Food Insecurity: No Food Insecurity  . Worried About Charity fundraiser in the Last Year: Never true  . Ran Out of Food in the Last Year: Never true  Transportation Needs: No Transportation Needs  . Lack of Transportation (Medical): No  . Lack of Transportation (Non-Medical): No  Physical Activity: Sufficiently Active  . Days of Exercise per Week: 3 days  . Minutes of Exercise per Session: 60 min  Stress: No Stress Concern Present  . Feeling of Stress : Not at all  Social Connections: Not on file     Tobacco Counseling Counseling given: Not Answered Comment: quit in 1972   Clinical Intake:  Pre-visit preparation completed: Yes  Pain : 0-10 Pain Score: 10-Worst pain ever Pain Type: Chronic pain Pain Location: Back Pain Descriptors / Indicators: Aching Pain Onset: More than a month ago Pain Frequency: Intermittent     Nutritional Risks: None Diabetes: No  How often do you need to have someone help you when you read instructions, pamphlets, or other written materials from your doctor or pharmacy?: 1 - Never What is the last grade level you completed in school?: 12th, some college courses in [place]  Diabetic: No Nutrition Risk Assessment:  Has the patient had any N/V/D within the last 2 months?  No  Does the patient have any non-healing wounds?  No  Has the patient had any unintentional weight loss or weight gain?  No   Diabetes:  Is the patient diabetic?  No  If diabetic, was a CBG obtained today?  N/A Did the patient bring in their glucometer from home?  N/A How often do you monitor your CBG's? N/A.   Financial Strains and Diabetes Management:  Are you having any financial strains with the device, your supplies or your medication? N/A.  Does the patient want to be seen by Chronic Care Management for management of their diabetes?  N/A Would the patient like to be referred to a Nutritionist or for Diabetic Management?  N/A   Interpreter Needed?: No  Information entered by :: CJohnson, LPN   Activities of Daily Living In your present state of health, do you have any difficulty performing the following activities: 04/30/2020  Hearing? Y  Comment wears hearing aids  Vision? N  Difficulty concentrating or making decisions? N  Walking or climbing stairs? N  Dressing or bathing? N  Doing errands, shopping? N  Preparing Food and eating ? N  Using the Toilet? N  In the past six months, have you accidently leaked urine? N  Do you have problems with loss  of bowel control? N  Managing your Medications? N  Managing your Finances? N  Housekeeping or managing your Housekeeping? N  Some recent data might be hidden    Patient Care  Team: Tonia Ghent, MD as PCP - General (Family Medicine) Tamsen Meek, MD as Referring Physician (Dermatology) Jannet Mantis, MD as Referring Physician (Dermatology) Jennette Kettle, MD as Referring Physician (Internal Medicine)  Indicate any recent Medical Services you may have received from other than [facility] providers in the past year (date may be approximate).     Assessment:   This is a routine wellness examination for Perkins.  Hearing/Vision screen  Hearing Screening   125Hz  250Hz  500Hz  1000Hz  2000Hz  3000Hz  4000Hz  6000Hz  8000Hz   Right ear:           Left ear:           Vision Screening Comments: Patient gets annual eye exams   Dietary issues and exercise activities discussed: Current Exercise Habits: Structured exercise class, Type of exercise: strength training/weights, Time (Minutes): 60, Frequency (Times/Week): 3, Weekly Exercise (Minutes/Week): 180, Intensity: Moderate, Exercise limited by: None identified  Goals    . Increase physical activity     Starting 03/29/2018, I will continue to exercise for at least 60 min 3 days per week.     . Patient Stated     04/30/2020, I will continue to go to the gym 3 days a week for 1 hour.       Depression Screen PHQ 2/9 Scores 04/30/2020 04/08/2019 03/29/2018 03/21/2017 12/01/2015 12/07/2014 01/23/2013  PHQ - 2 Score 4 2 0 0 0 0 2  PHQ- 9 Score 6 - 0 0 - - -    Fall Risk Fall Risk  04/30/2020 04/15/2019 04/08/2019 03/29/2018 03/21/2017  Falls in the past year? 1 0 0 0 No  Comment fell on incline [place] Telephone Survey: data to providers prior to load - - -  Number falls in past yr: 0 - - - -  Injury with Fall? 0 - - - -  Risk for fall due to : Medication side effect - - - -  Follow up Falls evaluation completed;Falls prevention discussed  - - - -    FALL RISK PREVENTION PERTAINING TO THE HOME:  Any stairs in or around the home? Yes  If so, are there any without handrails? No  Home free of loose throw rugs in walkways, pet beds, electrical cords, etc? Yes  Adequate lighting in your home to reduce risk of falls? Yes   ASSISTIVE DEVICES UTILIZED TO PREVENT FALLS:  Life alert? No  Use of a cane, walker or w/c? No  Grab bars in the bathroom? No  Shower chair or bench in shower? No  Elevated toilet seat or a handicapped toilet? No   TIMED UP AND GO:  Was the test performed? N/A, telephone visit .   Cognitive Function: MMSE - Mini Mental State Exam 04/30/2020 03/29/2018 03/21/2017 12/01/2015  Orientation to time 5 5 5 5   Orientation to Place 5 5 5 5   Registration 3 3 3 3   Attention/ Calculation 5 0 0 0  Recall 3 3 3 3   Language- name 2 objects - 0 0 0  Language- repeat 1 1 1 1   Language- follow 3 step command - 3 3 3   Language- read & follow direction - 0 0 0  Write a sentence - 0 0 0  Copy design - 0 0 0  Total score - 20 20 20   Mini Cog  Mini-Cog screen was completed. Maximum score is 22. A value of 0 denotes this part of the MMSE was not completed or the patient failed this part of the Mini-Cog  screening.       Immunizations Immunization History  Administered Date(s) Administered  . Influenza, High Dose Seasonal PF 02/13/2014, 01/10/2019  . Influenza,inj,quad, With Preservative 02/19/2018  . Influenza-Unspecified 02/28/2017, 02/14/2018, 01/26/2020  . PFIZER SARS-COV-2 Vaccination 05/28/2019, 06/18/2019, 01/29/2020  . Pneumococcal Polysaccharide-23 09/22/2010  . Td 10/01/2008  . Tdap 03/05/2017  . Zoster 05/22/2013    TDAP status: Up to date  Flu Vaccine status: Up to date  Pneumococcal vaccine status: Up to date  Covid-19 vaccine status: Completed vaccines  Qualifies for Shingles Vaccine? Yes   Zostavax completed Yes   Shingrix Completed?: No.    Education has been provided regarding the  importance of this vaccine. Patient has been advised to call insurance company to determine out of pocket expense if they have not yet received this vaccine. Advised may also receive vaccine at local pharmacy or Health Dept. Verbalized acceptance and understanding.  Screening Tests Health Maintenance  Topic Date Due  . COLON CANCER SCREENING ANNUAL FOBT  11/30/2028 (Originally 11/07/2012)  . COVID-19 Vaccine (4 - Booster for Pfizer series) 07/28/2020  . COLONOSCOPY  04/20/2025  . TETANUS/TDAP  03/06/2027  . INFLUENZA VACCINE  Completed  . Hepatitis C Screening  Completed  . PNA vac Low Risk Adult  Completed    Health Maintenance  There are no preventive care reminders to display for this patient.  Colorectal cancer screening: Type of screening: Colonoscopy. Completed 04/21/2015. Repeat every 10 years  Lung Cancer Screening: (Low Dose CT Chest recommended if Age 53-80 years, 30 pack-year currently smoking OR have quit w/in 15 years.) does not qualify.    Additional Screening:  Hepatitis C Screening: does qualify; Completed 12/01/2015  Vision Screening: Recommended annual ophthalmology exams for early detection of glaucoma and other disorders of the eye. Is the patient up to date with their annual eye exam?  Yes  Who is the provider or what is the name of the office in which the patient attends annual eye exams? [facility] If pt is not established with a provider, would they like to be referred to a provider to establish care? No .   Dental Screening: Recommended annual dental exams for proper oral hygiene  Community Resource Referral / Chronic Care Management: CRR required this visit?  No   CCM required this visit?  No      Plan:     I have personally reviewed and noted the following in the patient's chart:   . Medical and social history . Use of alcohol, tobacco or illicit drugs  . Current medications and supplements . Functional ability and status . Nutritional  status . Physical activity . Advanced directives . List of other physicians . Hospitalizations, surgeries, and ER visits in previous 12 months . Vitals . Screenings to include cognitive, depression, and falls . Referrals and appointments  In addition, I have reviewed and discussed with patient certain preventive protocols, quality metrics, and best practice recommendations. A written personalized care plan for preventive services as well as general preventive health recommendations were provided to patient.   Due to this being a telephonic visit, the after visit summary with patients personalized plan was offered to patient via office or my-chart. Patient preferred to pick up at office at next visit or via mychart.   Andrez Grime, LPN   [id number]

## 2020-04-30 NOTE — Telephone Encounter (Signed)
Completed AWV for patient. Patient needs his labs and physical scheduled with provider. I told the patient someone will call him to set this up.

## 2020-04-30 NOTE — Progress Notes (Signed)
PCP notes:  Health Maintenance: No gaps noted   Abnormal Screenings: none   Patient concerns: Ongoing back pain    Nurse concerns: none   Next PCP appt.: none

## 2020-04-30 NOTE — Patient Instructions (Signed)
David Mcgee , Thank you for taking time to come for your Medicare Wellness Visit. I appreciate your ongoing commitment to your health goals. Please review the following plan we discussed and let me know if I can assist you in the future.   Screening recommendations/referrals: Colonoscopy: Up to date, completed 04/21/2015, due 03/2025 Recommended yearly ophthalmology/optometry visit for glaucoma screening and checkup Recommended yearly dental visit for hygiene and checkup  Vaccinations: Influenza vaccine: Up to date, completed 01/26/2020, due 12/2020 Pneumococcal vaccine: Completed series Tdap vaccine: Up to date, completed 03/05/2017, due 02/2027 Shingles vaccine: due, check with your insurance regarding coverage if interested   Covid-19: Completed series  Advanced directives: Please bring a copy of your POA (Power of [place]) and/or Living Will to your next appointment.   Conditions/risks identified: hypertension, hyperlipidemia  Next appointment: Follow up in one year for your annual wellness visit.   Preventive Care 75 Years and Older, Male Preventive care refers to lifestyle choices and visits with your health care provider that can promote health and wellness. What does preventive care include?  A yearly physical exam. This is also called an annual well check.  Dental exams once or twice a year.  Routine eye exams. Ask your health care provider how often you should have your eyes checked.  Personal lifestyle choices, including:  Daily care of your teeth and gums.  Regular physical activity.  Eating a healthy diet.  Avoiding tobacco and drug use.  Limiting alcohol use.  Practicing safe sex.  Taking low doses of aspirin every day.  Taking vitamin and mineral supplements as recommended by your health care provider. What happens during an annual well check? The services and screenings done by your health care provider during your annual well check will depend on your  age, overall health, lifestyle risk factors, and family history of disease. Counseling  Your health care provider may ask you questions about your:  Alcohol use.  Tobacco use.  Drug use.  Emotional well-being.  Home and relationship well-being.  Sexual activity.  Eating habits.  History of falls.  Memory and ability to understand (cognition).  Work and work Statistician. Screening  You may have the following tests or measurements:  Height, weight, and BMI.  Blood pressure.  Lipid and cholesterol levels. These may be checked every 5 years, or more frequently if you are over 26 years old.  Skin check.  Lung cancer screening. You may have this screening every year starting at age 85 if you have a 30-pack-year history of smoking and currently smoke or have quit within the past 15 years.  Fecal occult blood test (FOBT) of the stool. You may have this test every year starting at age 47.  Flexible sigmoidoscopy or colonoscopy. You may have a sigmoidoscopy every 5 years or a colonoscopy every 10 years starting at age 54.  Prostate cancer screening. Recommendations will vary depending on your family history and other risks.  Hepatitis C blood test.  Hepatitis B blood test.  Sexually transmitted disease (STD) testing.  Diabetes screening. This is done by checking your blood sugar (glucose) after you have not eaten for a while (fasting). You may have this done every 1-3 years.  Abdominal aortic aneurysm (AAA) screening. You may need this if you are a current or former smoker.  Osteoporosis. You may be screened starting at age 68 if you are at high risk. Talk with your health care provider about your test results, treatment options, and if necessary, the need for  more tests. Vaccines  Your health care provider may recommend certain vaccines, such as:  Influenza vaccine. This is recommended every year.  Tetanus, diphtheria, and acellular pertussis (Tdap, Td) vaccine. You  may need a Td booster every 10 years.  Zoster vaccine. You may need this after age 33.  Pneumococcal 13-valent conjugate (PCV13) vaccine. One dose is recommended after age 45.  Pneumococcal polysaccharide (PPSV23) vaccine. One dose is recommended after age 20. Talk to your health care provider about which screenings and vaccines you need and how often you need them. This information is not intended to replace advice given to you by your health care provider. Make sure you discuss any questions you have with your health care provider. Document Released: 06/04/2015 Document Revised: 01/26/2016 Document Reviewed: 03/09/2015 Elsevier Interactive Patient Education  2017 [employer] Prevention in the Home Falls can cause injuries. They can happen to people of all ages. There are many things you can do to make your home safe and to help prevent falls. What can I do on the outside of my home?  Regularly fix the edges of walkways and driveways and fix any cracks.  Remove anything that might make you trip as you walk through a door, such as a raised step or threshold.  Trim any bushes or trees on the path to your home.  Use bright outdoor lighting.  Clear any walking paths of anything that might make someone trip, such as rocks or tools.  Regularly check to see if handrails are loose or broken. Make sure that both sides of any steps have handrails.  Any raised decks and porches should have guardrails on the edges.  Have any leaves, snow, or ice cleared regularly.  Use sand or salt on walking paths during winter.  Clean up any spills in your garage right away. This includes oil or grease spills. What can I do in the bathroom?  Use night lights.  Install grab bars by the toilet and in the tub and shower. Do not use towel bars as grab bars.  Use non-skid mats or decals in the tub or shower.  If you need to sit down in the shower, use a plastic, non-slip stool.  Keep the floor  dry. Clean up any water that spills on the floor as soon as it happens.  Remove soap buildup in the tub or shower regularly.  Attach bath mats securely with double-sided non-slip rug tape.  Do not have throw rugs and other things on the floor that can make you trip. What can I do in the bedroom?  Use night lights.  Make sure that you have a light by your bed that is easy to reach.  Do not use any sheets or blankets that are too big for your bed. They should not hang down onto the floor.  Have a firm chair that has side arms. You can use this for support while you get dressed.  Do not have throw rugs and other things on the floor that can make you trip. What can I do in the kitchen?  Clean up any spills right away.  Avoid walking on wet floors.  Keep items that you use a lot in easy-to-reach places.  If you need to reach something above you, use a strong step stool that has a grab bar.  Keep electrical cords out of the way.  Do not use floor polish or wax that makes floors slippery. If you must use wax, use non-skid  floor wax.  Do not have throw rugs and other things on the floor that can make you trip. What can I do with my stairs?  Do not leave any items on the stairs.  Make sure that there are handrails on both sides of the stairs and use them. Fix handrails that are broken or loose. Make sure that handrails are as long as the stairways.  Check any carpeting to make sure that it is firmly attached to the stairs. Fix any carpet that is loose or worn.  Avoid having throw rugs at the top or bottom of the stairs. If you do have throw rugs, attach them to the floor with carpet tape.  Make sure that you have a light switch at the top of the stairs and the bottom of the stairs. If you do not have them, ask someone to add them for you. What else can I do to help prevent falls?  Wear shoes that:  Do not have high heels.  Have rubber bottoms.  Are comfortable and fit you  well.  Are closed at the toe. Do not wear sandals.  If you use a stepladder:  Make sure that it is fully opened. Do not climb a closed stepladder.  Make sure that both sides of the stepladder are locked into place.  Ask someone to hold it for you, if possible.  Clearly mark and make sure that you can see:  Any grab bars or handrails.  First and last steps.  Where the edge of each step is.  Use tools that help you move around (mobility aids) if they are needed. These include:  Canes.  Walkers.  Scooters.  Crutches.  Turn on the lights when you go into a dark area. Replace any light bulbs as soon as they burn out.  Set up your furniture so you have a clear path. Avoid moving your furniture around.  If any of your floors are uneven, fix them.  If there are any pets around you, be aware of where they are.  Review your medicines with your doctor. Some medicines can make you feel dizzy. This can increase your chance of falling. Ask your doctor what other things that you can do to help prevent falls. This information is not intended to replace advice given to you by your health care provider. Make sure you discuss any questions you have with your health care provider. Document Released: 03/04/2009 Document Revised: 10/14/2015 Document Reviewed: 06/12/2014 Elsevier Interactive Patient Education  2017 [employer].

## 2020-05-02 NOTE — Telephone Encounter (Signed)
Please get patient scheduled when possible for yearly visit.  Thanks.  Please tell the patient that I look forward to seeing him.

## 2020-05-03 ENCOUNTER — Other Ambulatory Visit: Payer: Self-pay | Admitting: Family Medicine

## 2020-05-03 DIAGNOSIS — M545 Low back pain, unspecified: Secondary | ICD-10-CM

## 2020-05-06 DIAGNOSIS — C44319 Basal cell carcinoma of skin of other parts of face: Secondary | ICD-10-CM | POA: Diagnosis not present

## 2020-05-07 ENCOUNTER — Ambulatory Visit: Payer: PPO

## 2020-05-09 ENCOUNTER — Other Ambulatory Visit: Payer: Self-pay | Admitting: Family Medicine

## 2020-05-09 DIAGNOSIS — E785 Hyperlipidemia, unspecified: Secondary | ICD-10-CM

## 2020-05-10 ENCOUNTER — Other Ambulatory Visit (INDEPENDENT_AMBULATORY_CARE_PROVIDER_SITE_OTHER): Payer: PPO

## 2020-05-10 ENCOUNTER — Other Ambulatory Visit: Payer: Self-pay

## 2020-05-10 DIAGNOSIS — E785 Hyperlipidemia, unspecified: Secondary | ICD-10-CM | POA: Diagnosis not present

## 2020-05-10 LAB — BASIC METABOLIC PANEL
BUN: 14 mg/dL (ref 6–23)
CO2: 28 mEq/L (ref 19–32)
Calcium: 9.4 mg/dL (ref 8.4–10.5)
Chloride: 102 mEq/L (ref 96–112)
Creatinine, Ser: 1.53 mg/dL — ABNORMAL HIGH (ref 0.40–1.50)
GFR: 44.14 mL/min — ABNORMAL LOW (ref >60.00)
Glucose, Bld: 110 mg/dL — ABNORMAL HIGH (ref 70–99)
Potassium: 4.2 mEq/L (ref 3.5–5.1)
Sodium: 137 mEq/L (ref 135–145)

## 2020-05-10 LAB — LIPID PANEL
Cholesterol: 133 mg/dL (ref 0–200)
HDL: 39.9 mg/dL (ref >39.00)
LDL Cholesterol: 66 mg/dL (ref 0–99)
NonHDL: 92.98
Total CHOL/HDL Ratio: 3
Triglycerides: 137 mg/dL (ref 0.0–149.0)
VLDL: 27.4 mg/dL (ref 0.0–40.0)

## 2020-05-19 ENCOUNTER — Ambulatory Visit
Admission: RE | Admit: 2020-05-19 | Discharge: 2020-05-19 | Disposition: A | Discharge status: Home / Self Care | Payer: No Typology Code available for payment source | Source: Ambulatory Visit | Attending: Family Medicine | Admitting: Family Medicine

## 2020-05-19 ENCOUNTER — Other Ambulatory Visit: Payer: Self-pay

## 2020-05-19 DIAGNOSIS — M545 Low back pain, unspecified: Secondary | ICD-10-CM | POA: Diagnosis not present

## 2020-05-20 ENCOUNTER — Encounter: Payer: PPO | Admitting: Family Medicine

## 2020-05-28 ENCOUNTER — Ambulatory Visit: Payer: PPO | Admitting: Family Medicine

## 2020-06-08 ENCOUNTER — Ambulatory Visit (INDEPENDENT_AMBULATORY_CARE_PROVIDER_SITE_OTHER): Payer: PPO | Admitting: Family Medicine

## 2020-06-08 ENCOUNTER — Encounter: Payer: Self-pay | Admitting: Family Medicine

## 2020-06-08 ENCOUNTER — Other Ambulatory Visit: Payer: Self-pay

## 2020-06-08 VITALS — BP 138/76 | HR 67 | Temp 98.0°F | Ht 74.0 in | Wt 230.0 lb

## 2020-06-08 DIAGNOSIS — R7989 Other specified abnormal findings of blood chemistry: Secondary | ICD-10-CM | POA: Diagnosis not present

## 2020-06-08 DIAGNOSIS — M545 Low back pain, unspecified: Secondary | ICD-10-CM

## 2020-06-08 DIAGNOSIS — F4310 Post-traumatic stress disorder, unspecified: Secondary | ICD-10-CM

## 2020-06-08 DIAGNOSIS — I10 Essential (primary) hypertension: Secondary | ICD-10-CM | POA: Diagnosis not present

## 2020-06-08 DIAGNOSIS — Z7189 Other specified counseling: Secondary | ICD-10-CM

## 2020-06-08 DIAGNOSIS — R059 Cough, unspecified: Secondary | ICD-10-CM | POA: Diagnosis not present

## 2020-06-08 DIAGNOSIS — E785 Hyperlipidemia, unspecified: Secondary | ICD-10-CM

## 2020-06-08 DIAGNOSIS — Z0000 Encounter for general adult medical examination without abnormal findings: Secondary | ICD-10-CM

## 2020-06-08 MED ORDER — ALBUTEROL SULFATE HFA 108 (90 BASE) MCG/ACT IN AERS: 2.0000 | INHALATION_SPRAY | Freq: Four times a day (QID) | RESPIRATORY_TRACT | 1 refills | Status: DC | PRN

## 2020-06-08 NOTE — Patient Instructions (Signed)
I expect the cough to gradually get better.  Use the inhaler if needed.  Update me as needed.  Take care.  Glad to see you.

## 2020-06-08 NOTE — Progress Notes (Signed)
This visit occurred during the SARS-CoV-2 public health emergency.  Safety protocols were in place, including screening questions prior to the visit, additional usage of staff PPE, and extensive cleaning of exam room while observing appropriate contact time as indicated for disinfecting solutions.  Hypertension:    Using medication without problems or lightheadedness: yes Chest pain with exertion:no Edema:no Short of breath:no  Elevated Cholesterol: Using medications without problems: yes Muscle aches: no Diet compliance: encouraged.   Exercise: yes  He could have had covid again vs another infection.  He had body aches, fever.  Sx resolved except cough/wheeze.  Sx started about 3 weeks ago.  Even if he had COVID he is clearly out of the quarantine window.  Discussed.  Mood d/w pt.  He is off sertraline, per patient it didn't help much and he didn't tolerate the higher dose.  Others thought he was better on med, discussed options.  "I can get upset."  We talked about survivor guilt.  He had f/u with the [place] clinic and I asked him to f/u with them about this situation.  No SI/HI.  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    He had skin cancer resection per derm.  R temple.    Flu 2021 Shingrix done.  PNA d/w pt.  Prev done at [facility].   Tetanus 2018 covid vaccine 2021 Colonoscopy 2016 per [place] [facility].  Prostate cancer screening and PSA options(with potential risks and benefits of testing vs not testing) were discussed along with recent recs/guidelines. He declined testing PSAat this point. Advance directive- wife designated if patient were incapacitated.  AAA screen 2014.    Episodic back pain.  Some days with no sx. No sx currently.  He was able to work out today.  He is putting up with sx as is.    IMPRESSION: 1. 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 2. Milder multilevel degenerative change, as detailed above. 3. Right laminectomy at L4 for cyst removal without evidence of recurrence cyst.  Meds, vitals, and allergies reviewed.  ROS: Per HPI unless specifically indicated in ROS section   GEN: nad, alert and oriented HEENT: ncat, healed skin cancer excision site at the right temple. NECK: supple w/o LA CV: rrr PULM: ctab, no inc wob ABD: soft, +bs EXT: no edema SKIN: no acute rash

## 2020-06-10 DIAGNOSIS — R059 Cough, unspecified: Secondary | ICD-10-CM | POA: Insufficient documentation

## 2020-06-10 NOTE — Assessment & Plan Note (Signed)
He is off sertraline, per patient it didn't help much and he didn't tolerate the higher dose.  Others thought he was better on med, discussed options.  "I can get upset."  We talked about survivor guilt.  He had f/u with the [place] clinic and I asked him to f/u with them about this situation.  No SI/HI.  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  He said he would check with the [place].  Okay for outpatient follow-up.

## 2020-06-10 NOTE — Assessment & Plan Note (Signed)
Per dermatology.  I will defer.  He agrees. 

## 2020-06-10 NOTE — Assessment & Plan Note (Signed)
Lungs are clear.  Likely postinfectious cough and can try using albuterol in the meantime.  This should gradually improve.  He will update me as needed.

## 2020-06-10 NOTE — Assessment & Plan Note (Signed)
With episodic flares.  He is putting up with it as is.  He will update me as needed.  He wants to avoid surgery.

## 2020-06-10 NOTE — Assessment & Plan Note (Signed)
Creatinine stable.  Discussed with patient.  No change in meds.

## 2020-06-10 NOTE — Assessment & Plan Note (Signed)
Flu 2021 Shingrix done.  PNA d/w pt.  Prev done at [facility].   Tetanus 2018 covid vaccine 2021 Colonoscopy 2016 per [place] [facility].  Prostate cancer screening and PSA options(with potential risks and benefits of testing vs not testing) were discussed along with recent recs/guidelines. He declined testing PSAat this point. Advance directive- wife designated if patient were incapacitated.  AAA screen 2014.

## 2020-06-10 NOTE — Assessment & Plan Note (Signed)
Controlled.  Continue amlodipine.  Continue lisinopril.

## 2020-06-10 NOTE — Assessment & Plan Note (Signed)
Advance directive- wife designated if patient were incapacitated.  

## 2020-06-10 NOTE — Assessment & Plan Note (Signed)
Controlled.  Continue atorvastatin.  Continue work on diet and exercise.

## 2020-08-17 DIAGNOSIS — Z96653 Presence of artificial knee joint, bilateral: Secondary | ICD-10-CM | POA: Diagnosis not present

## 2020-08-22 DIAGNOSIS — L570 Actinic keratosis: Secondary | ICD-10-CM | POA: Insufficient documentation

## 2020-11-25 DIAGNOSIS — C44319 Basal cell carcinoma of skin of other parts of face: Secondary | ICD-10-CM | POA: Diagnosis not present

## 2020-12-08 DIAGNOSIS — H5319 Other subjective visual disturbances: Secondary | ICD-10-CM | POA: Diagnosis not present

## 2020-12-20 DEATH — deceased

## 2021-05-02 NOTE — Progress Notes (Signed)
Subjective:   David Mcgee is a 75 y.o. male who presents for Medicare Annual/Subsequent preventive examination.  I connected with Willeen Niece today by telephone and verified that I am speaking with the correct person using two identifiers. Location patient: home Location provider: work Persons participating in the virtual visit: patient, Marine scientist.    I discussed the limitations, risks, security and privacy concerns of performing an evaluation and management service by telephone and the availability of in person appointments. I also discussed with the patient that there may be a patient responsible charge related to this service. The patient expressed understanding and verbally consented to this telephonic visit.    Interactive audio and video telecommunications were attempted between this provider and patient, however failed, due to patient having technical difficulties OR patient did not have access to video capability.  We continued and completed visit with audio only.  Some vital signs may be absent or patient reported.   Time Spent with patient on telephone encounter: 30 minutes  Review of Systems     Cardiac Risk Factors include: advanced age (>57men, >65 women);hypertension;dyslipidemia     Objective:    Today's Vitals   05/04/21 1402  Weight: 230 lb (104.3 kg)  Height: 6\' 2"  (1.88 m)   Body mass index is 29.53 kg/m.  Advanced Directives 05/04/2021 04/30/2020 05/13/2019 05/13/2019 07/04/2018 06/27/2018 03/29/2018  Does Patient Have a Medical Advance Directive? Yes Yes No No Yes Yes Yes  Type of Paramedic of [place];Living will [place];Living will - - [place];Living will Living will;Healthcare Power of [place];Living will  Does patient want to make changes to medical advance directive? Yes (MAU/Ambulatory/Procedural Areas - Information given) - - - No - Patient declined No - Patient  declined -  Copy of [place] in Chart? - No - copy requested - - No - copy requested No - copy requested No - copy requested  Would patient like information on creating a medical advance directive? - - No - Patient declined No - Patient declined - - -  Pre-existing out of facility DNR order (yellow form or pink MOST form) - - - - - - -    Current Medications (verified) Outpatient Encounter Medications as of 05/04/2021  Medication Sig   amLODipine (NORVASC) 10 MG tablet TAKE 1 TABLET BY MOUTH EVERYDAY AT BEDTIME   ammonium lactate (AMLACTIN) 12 % cream Apply 1 g topically as needed (itchy skin).   aspirin EC 81 MG tablet Take 81 mg by mouth daily.   atorvastatin (LIPITOR) 10 MG tablet Take 10 mg by mouth daily.   Carboxymethylcellulose Sodium (THERATEARS OP) Apply 1 drop to eye daily as needed (dry eyes).   cholecalciferol (VITAMIN D3) 25 MCG (1000 UT) tablet Take 1,000 Units by mouth daily.   lisinopril (PRINIVIL,ZESTRIL) 5 MG tablet Take 2.5 mg by mouth daily.   Multiple Vitamin (MULTIVITAMIN) tablet Take 1 tablet by mouth daily.   Omega-3 Fatty Acids (FISH OIL) 1000 MG CAPS Take 1,000 mg by mouth 2 (two) times daily.    sertraline (ZOLOFT) 100 MG tablet Take 100 mg by mouth daily.   tamsulosin (FLOMAX) 0.4 MG CAPS capsule Take 0.4 mg by mouth at bedtime.    albuterol (VENTOLIN HFA) 108 (90 Base) MCG/ACT inhaler Inhale 2 puffs into the lungs every 6 (six) hours as needed for wheezing or shortness of breath. (Patient not taking: Reported on 05/04/2021)   docusate sodium (  COLACE) 100 MG capsule Take 200 mg by mouth 2 (two) times daily as needed for mild constipation. (Patient not taking: Reported on 05/04/2021)   hydrocortisone (ANUSOL-HC) 2.5 % rectal cream Place 1 application rectally daily as needed for hemorrhoids or anal itching. (Patient not taking: Reported on 05/04/2021)   Melatonin 5 MG TABS Take 5 mg by mouth at bedtime as needed. (Patient not taking: Reported on  05/04/2021)   No facility-administered encounter medications on file as of 05/04/2021.    Allergies (verified) Nsaids, Augmentin [amoxicillin-pot clavulanate], Azithromycin, and Trazodone and nefazodone   History: Past Medical History:  Diagnosis Date   Arthritis    back, knees, hands - OA   Cancer (HCC)    Skin, not melanoma   Chronic kidney disease    loss of kidney function - L - since 1990's, h/o of renal calculi. 45% total function on right   Depression    ? panic attacks, takes sertraline & also sees psych q 3 months    GERD (gastroesophageal reflux disease)    H/O hiatal hernia    History of stress test    long time ago- wnl   Hypertension    Insomnia    Palpitations    PTSD (post-traumatic stress disorder)    post [place]   Spinal cord cysts    Past Surgical History:  Procedure Laterality Date   BACK SURGERY     ESOPHAGOGASTRODUODENOSCOPY  06/1995   Dilitation bx negative (Dr. Epifanio Lesches)   Hernoiorraphy     Right; years ago   JOINT REPLACEMENT Bilateral    KNEE ARTHROPLASTY Right 08/18/2015   Procedure: COMPUTER ASSISTED TOTAL KNEE ARTHROPLASTY;  Surgeon: Dereck Leep, MD;  Location: [facility] ORS;  Service: Orthopedics;  Laterality: Right;   KNEE ARTHROSCOPY  10/2000   Right Knee (Dr. Sabra Heck)   LUMBAR LAMINECTOMY/DECOMPRESSION MICRODISCECTOMY Right 12/09/2012   Procedure: LUMBAR LAMINECTOMY/DECOMPRESSION MICRODISCECTOMY 1 LEVEL;  Surgeon: Elaina Hoops, MD;  Location: [facility];  Service: Neurosurgery;  Laterality: Right;  LUMBAR LAMINECTOMY/DECOMPRESSION MICRODISCECTOMY 1 LEVEL   MOHS SURGERY  01/26/2017   nose   NEUROMA SURGERY Right 2000   Right foot Morton's neuroma excision   SHOULDER ARTHROSCOPY WITH SUBACROMIAL DECOMPRESSION AND BICEP TENDON REPAIR Left 07/04/2018   Procedure: SHOULDER ARTHROSCOPY WITH DEBRIDEMENT, DECOMPRESSION, REPAIR OF PARTIAL THICKNESS ROTATOR CUFF TEAR, BICEP TENODESIS WITH POSSIBLE DISTAL CLAVICLE EXCISION;  Surgeon: Corky Mull, MD;   Location: [facility];  Service: Orthopedics;  Laterality: Left;   TONSILLECTOMY     TOTAL KNEE ARTHROPLASTY Bilateral 2013, 2016   Family History  Problem Relation Age of Onset   Hypertension Mother    Diabetes Mother    Kidney failure Father    Hypertension Father    Obesity Sister    Diabetes Sister    Diabetes Brother    Hypertension Brother    Hyperlipidemia Brother    Stroke Brother    Depression Brother        ?   Colon cancer Neg Hx    Prostate cancer Neg Hx    Social History   Socioeconomic History   Marital status: Married    Spouse name: joyce   Number of children: Not on file   Years of education: Not on file   Highest education level: Not on file  Occupational History   Occupation: Retired     Fish farm manager: [employer]   Occupation: deacon at [employer]  Tobacco Use   Smoking status: Former    Packs/day: 1.50  Years: 9.00    Pack years: 13.50    Types: Cigarettes    Quit date: 05/22/1969    Years since quitting: 51.9   Smokeless tobacco: Never   Tobacco comments:    quit in 1972  Vaping Use   Vaping Use: Never used  Substance and Sexual Activity   Alcohol use: No   Drug use: No   Sexual activity: Yes  Other Topics Concern   Not on file  Social History Narrative   Married (second marriage 1989) and lives with wife   One child, 3 grandkids (2 step and 1 biological).   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  Agent orange exposure.   Retired from [employer], Engineer, production   Social Determinants of Radio broadcast assistant Strain: Low Risk    Difficulty of Paying Living Expenses: Not hard at all  Food Insecurity: No Food Insecurity   Worried About Charity fundraiser in the Last Year: Never true   Arboriculturist in the Last Year: Never true  Transportation Needs: No Transportation Needs   Lack of Transportation (Medical): No   Lack of Transportation (Non-Medical): No  Physical Activity: Sufficiently Active   Days of Exercise per Week: 3 days   Minutes of  Exercise per Session: 60 min  Stress: No Stress Concern Present   Feeling of Stress : Not at all  Social Connections: Socially Integrated   Frequency of Communication with Friends and Family: More than three times a week   Frequency of Social Gatherings with Friends and Family: More than three times a week   Attends Religious Services: More than 4 times per year   Active Member of [employer] or Organizations: Yes   Attends Music therapist: More than 4 times per year   Marital Status: Married    Tobacco Counseling Counseling given: Not Answered Tobacco comments: quit in 1972   Clinical Intake:  Pre-visit preparation completed: Yes  Pain : No/denies pain     BMI - recorded: 29.52 Nutritional Status: BMI 25 -29 Overweight Nutritional Risks: None Diabetes: No  How often do you need to have someone help you when you read instructions, pamphlets, or other written materials from your doctor or pharmacy?: 1 - Never  Diabetic? No  Interpreter Needed?: No  Information entered by :: Orrin Brigham LPN   Activities of Daily Living In your present state of health, do you have any difficulty performing the following activities: 05/04/2021  Hearing? Y  Comment wears hearing aids  Vision? N  Difficulty concentrating or making decisions? N  Walking or climbing stairs? N  Dressing or bathing? N  Doing errands, shopping? N  Preparing Food and eating ? N  Using the Toilet? N  In the past six months, have you accidently leaked urine? N  Do you have problems with loss of bowel control? N  Managing your Medications? N  Managing your Finances? N  Housekeeping or managing your Housekeeping? N  Some recent data might be hidden    Patient Care Team: Tonia Ghent, MD as PCP - General (Family Medicine) Tamsen Meek, MD as Referring Physician (Dermatology) Ree Edman, MD as Referring Physician (Dermatology) Jennette Kettle, MD as Referring Physician  (Internal Medicine)  Indicate any recent Medical Services you may have received from other than [facility] providers in the past year (date may be approximate).     Assessment:   This is a routine wellness examination for Salman.  Hearing/Vision screen Hearing Screening -  Comments:: Wears hearing aids Vision Screening - Comments:: Last exam 03/2021, wears glasses , [facility]  Dietary issues and exercise activities discussed: Current Exercise Habits: Home exercise routine, Type of exercise: strength training/weights;Other - see comments (recumbent bike), Time (Minutes): 60, Frequency (Times/Week): 3, Weekly Exercise (Minutes/Week): 180, Intensity: Moderate   Goals Addressed             This Visit's Progress    Patient Stated       Would like to drink more water       Depression Screen PHQ 2/9 Scores 05/04/2021 04/30/2020 04/08/2019 03/29/2018 03/21/2017 12/01/2015 12/07/2014  PHQ - 2 Score 0 4 2 0 0 0 0  PHQ- 9 Score - 6 - 0 0 - -    Fall Risk Fall Risk  05/04/2021 04/30/2020 04/15/2019 04/08/2019 03/29/2018  Falls in the past year? 0 1 0 0 0  Comment - fell on incline [place] Telephone Survey: data to providers prior to load - -  Number falls in past yr: 0 0 - - -  Injury with Fall? 0 0 - - -  Risk for fall due to : No Fall Risks Medication side effect - - -  Follow up Falls prevention discussed Falls evaluation completed;Falls prevention discussed - - -    FALL RISK PREVENTION PERTAINING TO THE HOME:  Any stairs in or around the home? Yes  If so, are there any without handrails? No  Home free of loose throw rugs in walkways, pet beds, electrical cords, etc? Yes  Adequate lighting in your home to reduce risk of falls? Yes   ASSISTIVE DEVICES UTILIZED TO PREVENT FALLS:  Life alert? No  Use of a cane, walker or w/c? No  Grab bars in the bathroom? Yes  Shower chair or bench in shower? Yes  Elevated toilet seat or a handicapped toilet? No   TIMED  UP AND GO:  Was the test performed? No , visit completed over the phone.     Cognitive Function: Normal cognitive status assessed by this Nurse Health Advisor. No abnormalities found.   MMSE - Mini Mental State Exam 04/30/2020 03/29/2018 03/21/2017 12/01/2015  Orientation to time 5 5 5 5   Orientation to Place 5 5 5 5   Registration 3 3 3 3   Attention/ Calculation 5 0 0 0  Recall 3 3 3 3   Language- name 2 objects - 0 0 0  Language- repeat 1 1 1 1   Language- follow 3 step command - 3 3 3   Language- read & follow direction - 0 0 0  Write a sentence - 0 0 0  Copy design - 0 0 0  Total score - 20 20 20         Immunizations Immunization History  Administered Date(s) Administered   Influenza, High Dose Seasonal PF 02/13/2014, 01/10/2019   Influenza,inj,quad, With Preservative 02/19/2018   Influenza-Unspecified 02/28/2017, 02/14/2018, 01/26/2020, 03/20/2021   PFIZER(Purple Top)SARS-COV-2 Vaccination 05/28/2019, 06/18/2019, 01/29/2020   Pneumococcal Polysaccharide-23 09/22/2010   Td 10/01/2008   Tdap 03/05/2017   Zoster Recombinat (Shingrix) 01/19/2020, 03/20/2020   Zoster, Live 05/22/2013    TDAP status: Up to date  Flu Vaccine status: Up to date  Pneumococcal vaccine status: Due, Education has been provided regarding the importance of this vaccine. Advised may receive this vaccine at local pharmacy or Health Dept. Aware to provide a copy of the vaccination record if obtained from local pharmacy or Health Dept. Verbalized acceptance and understanding.  Covid-19 vaccine  status: Information provided on how to obtain vaccines.   Qualifies for Shingles Vaccine? Yes   Zostavax completed Yes   Shingrix Completed?: Yes  Screening Tests Health Maintenance  Topic Date Due   Pneumonia Vaccine 58+ Years old (2 - PCV) 09/22/2011   COVID-19 Vaccine (5 - Booster for Pfizer series) 12/15/2020   TETANUS/TDAP  03/06/2027   INFLUENZA VACCINE  Completed   Hepatitis C Screening  Completed    Zoster Vaccines- Shingrix  Completed   HPV VACCINES  Aged Out   COLONOSCOPY (Pts 45-54yrs Insurance coverage will need to be confirmed)  Discontinued    Health Maintenance  Health Maintenance Due  Topic Date Due   Pneumonia Vaccine 38+ Years old (2 - PCV) 09/22/2011   COVID-19 Vaccine (5 - Booster for [place]ne series) 12/15/2020    Colorectal cancer screening: No longer required.   Lung Cancer Screening: (Low Dose CT Chest recommended if Age 60-80 years, 30 pack-year currently smoking OR have quit w/in 15years.) does not qualify.     Additional Screening:  Hepatitis C Screening: does qualify; Completed 12/01/15  Vision Screening: Recommended annual ophthalmology exams for early detection of glaucoma and other disorders of the eye. Is the patient up to date with their annual eye exam?  Yes  Who is the provider or what is the name of the office in which the patient attends annual eye exams? [place] Screening: Recommended annual dental exams for proper oral hygiene  Community Resource Referral / Chronic Care Management: CRR required this visit?  No   CCM required this visit?  No      Plan:     I have personally reviewed and noted the following in the patient's chart:   Medical and social history Use of alcohol, tobacco or illicit drugs  Current medications and supplements including opioid prescriptions. Patient is not currently taking opioid prescriptions. Functional ability and status Nutritional status Physical activity Advanced directives List of other physicians Hospitalizations, surgeries, and ER visits in previous 12 months Vitals Screenings to include cognitive, depression, and falls Referrals and appointments  In addition, I have reviewed and discussed with patient certain preventive protocols, quality metrics, and best practice recommendations. A written personalized care plan for preventive services as well as general preventive health  recommendations were provided to patient.   Due to this being a telephonic visit, the after visit summary with patients personalized plan was offered to patient via mail or my-chart. Patient would like to access on my-chart.    Loma Messing, LPN   [id number]   Nurse Health Advisor  Nurse Notes: none

## 2021-05-04 ENCOUNTER — Ambulatory Visit (INDEPENDENT_AMBULATORY_CARE_PROVIDER_SITE_OTHER): Payer: PPO

## 2021-05-04 VITALS — Ht 74.0 in | Wt 230.0 lb

## 2021-05-04 DIAGNOSIS — Z0000 Encounter for general adult medical examination without abnormal findings: Secondary | ICD-10-CM | POA: Diagnosis not present

## 2021-05-04 NOTE — Patient Instructions (Signed)
[name] , Thank you for taking time to complete your Medicare Wellness Visit. I appreciate your ongoing commitment to your health goals. Please review the following plan we discussed and let me know if I can assist you in the future.   Screening recommendations/referrals: Colonoscopy: no longer required  Recommended yearly ophthalmology/optometry visit for glaucoma screening and checkup Recommended yearly dental visit for hygiene and checkup  Vaccinations: Influenza vaccine: up to date  Pneumococcal vaccine:up to date for Pneumovax 23, discuss with PCP Prevnar 13 Tdap vaccine: up to date, completed 03/05/17, due 03/06/27 Shingles vaccine: up to date   Covid-19: newest Bi-valent  booster available at your local pharmacy   Advanced directives: Please bring a copy of Living Will and/or [place] for your chart.   Conditions/risks identified: see problem list  Next appointment: Follow up in one year for your annual wellness visit. 05/08/22 @ 8:15am, this will be a telephone visit.   Preventive Care 76 Years and Older, Male Preventive care refers to lifestyle choices and visits with your health care provider that can promote health and wellness. What does preventive care include? A yearly physical exam. This is also called an annual well check. Dental exams once or twice a year. Routine eye exams. Ask your health care provider how often you should have your eyes checked. Personal lifestyle choices, including: Daily care of your teeth and gums. Regular physical activity. Eating a healthy diet. Avoiding tobacco and drug use. Limiting alcohol use. Practicing safe sex. Taking low doses of aspirin every day. Taking vitamin and mineral supplements as recommended by your health care provider. What happens during an annual well check? The services and screenings done by your health care provider during your annual well check will depend on your age, overall health,  lifestyle risk factors, and family history of disease. Counseling  Your health care provider may ask you questions about your: Alcohol use. Tobacco use. Drug use. Emotional well-being. Home and relationship well-being. Sexual activity. Eating habits. History of falls. Memory and ability to understand (cognition). Work and work Statistician. Screening  You may have the following tests or measurements: Height, weight, and BMI. Blood pressure. Lipid and cholesterol levels. These may be checked every 5 years, or more frequently if you are over 46 years old. Skin check. Lung cancer screening. You may have this screening every year starting at age 54 if you have a 30-pack-year history of smoking and currently smoke or have quit within the past 15 years. Fecal occult blood test (FOBT) of the stool. You may have this test every year starting at age 51. Flexible sigmoidoscopy or colonoscopy. You may have a sigmoidoscopy every 5 years or a colonoscopy every 10 years starting at age 18. Prostate cancer screening. Recommendations will vary depending on your family history and other risks. Hepatitis C blood test. Hepatitis B blood test. Sexually transmitted disease (STD) testing. Diabetes screening. This is done by checking your blood sugar (glucose) after you have not eaten for a while (fasting). You may have this done every 1-3 years. Abdominal aortic aneurysm (AAA) screening. You may need this if you are a current or former smoker. Osteoporosis. You may be screened starting at age 76 if you are at high risk. Talk with your health care provider about your test results, treatment options, and if necessary, the need for more tests. Vaccines  Your health care provider may recommend certain vaccines, such as: Influenza vaccine. This is recommended every year. Tetanus, diphtheria, and acellular  pertussis (Tdap, Td) vaccine. You may need a Td booster every 10 years. Zoster vaccine. You may need this  after age 76. Pneumococcal 13-valent conjugate (PCV13) vaccine. One dose is recommended after age 76. Pneumococcal polysaccharide (PPSV23) vaccine. One dose is recommended after age 11. Talk to your health care provider about which screenings and vaccines you need and how often you need them. This information is not intended to replace advice given to you by your health care provider. Make sure you discuss any questions you have with your health care provider. Document Released: 06/04/2015 Document Revised: 01/26/2016 Document Reviewed: 03/09/2015 Elsevier Interactive Patient Education  2017 [place] Prevention in the Home Falls can cause injuries. They can happen to people of all ages. There are many things you can do to make your home safe and to help prevent falls. What can I do on the outside of my home? Regularly fix the edges of walkways and driveways and fix any cracks. Remove anything that might make you trip as you walk through a door, such as a raised step or threshold. Trim any bushes or trees on the path to your home. Use bright outdoor lighting. Clear any walking paths of anything that might make someone trip, such as rocks or tools. Regularly check to see if handrails are loose or broken. Make sure that both sides of any steps have handrails. Any raised decks and porches should have guardrails on the edges. Have any leaves, snow, or ice cleared regularly. Use sand or salt on walking paths during winter. Clean up any spills in your garage right away. This includes oil or grease spills. What can I do in the bathroom? Use night lights. Install grab bars by the toilet and in the tub and shower. Do not use towel bars as grab bars. Use non-skid mats or decals in the tub or shower. If you need to sit down in the shower, use a plastic, non-slip stool. Keep the floor dry. Clean up any water that spills on the floor as soon as it happens. Remove soap buildup in the tub or  shower regularly. Attach bath mats securely with double-sided non-slip rug tape. Do not have throw rugs and other things on the floor that can make you trip. What can I do in the bedroom? Use night lights. Make sure that you have a light by your bed that is easy to reach. Do not use any sheets or blankets that are too big for your bed. They should not hang down onto the floor. Have a firm chair that has side arms. You can use this for support while you get dressed. Do not have throw rugs and other things on the floor that can make you trip. What can I do in the kitchen? Clean up any spills right away. Avoid walking on wet floors. Keep items that you use a lot in easy-to-reach places. If you need to reach something above you, use a strong step stool that has a grab bar. Keep electrical cords out of the way. Do not use floor polish or wax that makes floors slippery. If you must use wax, use non-skid floor wax. Do not have throw rugs and other things on the floor that can make you trip. What can I do with my stairs? Do not leave any items on the stairs. Make sure that there are handrails on both sides of the stairs and use them. Fix handrails that are broken or loose. Make sure that handrails  are as long as the stairways. Check any carpeting to make sure that it is firmly attached to the stairs. Fix any carpet that is loose or worn. Avoid having throw rugs at the top or bottom of the stairs. If you do have throw rugs, attach them to the floor with carpet tape. Make sure that you have a light switch at the top of the stairs and the bottom of the stairs. If you do not have them, ask someone to add them for you. What else can I do to help prevent falls? Wear shoes that: Do not have high heels. Have rubber bottoms. Are comfortable and fit you well. Are closed at the toe. Do not wear sandals. If you use a stepladder: Make sure that it is fully opened. Do not climb a closed stepladder. Make  sure that both sides of the stepladder are locked into place. Ask someone to hold it for you, if possible. Clearly mark and make sure that you can see: Any grab bars or handrails. First and last steps. Where the edge of each step is. Use tools that help you move around (mobility aids) if they are needed. These include: Canes. Walkers. Scooters. Crutches. Turn on the lights when you go into a dark area. Replace any light bulbs as soon as they burn out. Set up your furniture so you have a clear path. Avoid moving your furniture around. If any of your floors are uneven, fix them. If there are any pets around you, be aware of where they are. Review your medicines with your doctor. Some medicines can make you feel dizzy. This can increase your chance of falling. Ask your doctor what other things that you can do to help prevent falls. This information is not intended to replace advice given to you by your health care provider. Make sure you discuss any questions you have with your health care provider. Document Released: 03/04/2009 Document Revised: 10/14/2015 Document Reviewed: 06/12/2014 Elsevier Interactive Patient Education  2017 [employer].

## 2021-09-21 ENCOUNTER — Other Ambulatory Visit: Payer: Self-pay | Admitting: Family Medicine

## 2021-09-21 DIAGNOSIS — R739 Hyperglycemia, unspecified: Secondary | ICD-10-CM

## 2021-09-21 DIAGNOSIS — E785 Hyperlipidemia, unspecified: Secondary | ICD-10-CM

## 2021-09-22 ENCOUNTER — Other Ambulatory Visit (INDEPENDENT_AMBULATORY_CARE_PROVIDER_SITE_OTHER): Payer: PPO

## 2021-09-22 DIAGNOSIS — E785 Hyperlipidemia, unspecified: Secondary | ICD-10-CM | POA: Diagnosis not present

## 2021-09-22 DIAGNOSIS — R739 Hyperglycemia, unspecified: Secondary | ICD-10-CM

## 2021-09-22 LAB — HEMOGLOBIN A1C: Hgb A1c MFr Bld: 5.7 % (ref 4.6–6.5)

## 2021-09-22 LAB — LIPID PANEL
Cholesterol: 120 mg/dL (ref 0–200)
HDL: 40.8 mg/dL (ref >39.00)
LDL Cholesterol: 57 mg/dL (ref 0–99)
NonHDL: 78.93
Total CHOL/HDL Ratio: 3
Triglycerides: 108 mg/dL (ref 0.0–149.0)
VLDL: 21.6 mg/dL (ref 0.0–40.0)

## 2021-09-22 LAB — COMPREHENSIVE METABOLIC PANEL
ALT: 22 U/L (ref 0–53)
AST: 23 U/L (ref 0–37)
Albumin: 4.4 g/dL (ref 3.5–5.2)
Alkaline Phosphatase: 44 U/L (ref 39–117)
BUN: 16 mg/dL (ref 6–23)
CO2: 28 mEq/L (ref 19–32)
Calcium: 9.3 mg/dL (ref 8.4–10.5)
Chloride: 101 mEq/L (ref 96–112)
Creatinine, Ser: 1.59 mg/dL — ABNORMAL HIGH (ref 0.40–1.50)
GFR: 41.75 mL/min — ABNORMAL LOW (ref >60.00)
Glucose, Bld: 121 mg/dL — ABNORMAL HIGH (ref 70–99)
Potassium: 4.1 mEq/L (ref 3.5–5.1)
Sodium: 136 mEq/L (ref 135–145)
Total Bilirubin: 1.4 mg/dL — ABNORMAL HIGH (ref 0.2–1.2)
Total Protein: 7.4 g/dL (ref 6.0–8.3)

## 2021-09-30 ENCOUNTER — Encounter: Payer: PPO | Admitting: Family Medicine

## 2021-10-03 ENCOUNTER — Ambulatory Visit (INDEPENDENT_AMBULATORY_CARE_PROVIDER_SITE_OTHER): Payer: PPO | Admitting: Family Medicine

## 2021-10-03 ENCOUNTER — Encounter: Payer: Self-pay | Admitting: Family Medicine

## 2021-10-03 VITALS — BP 122/72 | HR 63 | Temp 97.3°F | Ht 74.0 in | Wt 231.0 lb

## 2021-10-03 DIAGNOSIS — M6530 Trigger finger, unspecified finger: Secondary | ICD-10-CM | POA: Insufficient documentation

## 2021-10-03 DIAGNOSIS — N400 Enlarged prostate without lower urinary tract symptoms: Secondary | ICD-10-CM

## 2021-10-03 DIAGNOSIS — I10 Essential (primary) hypertension: Secondary | ICD-10-CM | POA: Diagnosis not present

## 2021-10-03 DIAGNOSIS — Z7189 Other specified counseling: Secondary | ICD-10-CM

## 2021-10-03 DIAGNOSIS — F4310 Post-traumatic stress disorder, unspecified: Secondary | ICD-10-CM

## 2021-10-03 DIAGNOSIS — E785 Hyperlipidemia, unspecified: Secondary | ICD-10-CM

## 2021-10-03 DIAGNOSIS — Z0000 Encounter for general adult medical examination without abnormal findings: Secondary | ICD-10-CM

## 2021-10-03 DIAGNOSIS — R7989 Other specified abnormal findings of blood chemistry: Secondary | ICD-10-CM | POA: Diagnosis not present

## 2021-10-03 NOTE — Progress Notes (Signed)
Hypertension:    ?Using medication without problems or lightheadedness: yes ?Chest pain with exertion:no ?Edema:no ?Short of breath:no ?Labs d/w pt.   ? ?Cr at baseline ~1.5, d/w pt. not changed significantly from previous/recent years. ? ?Elevated Cholesterol: ?Using medications without problems: yes ?Muscle aches: no ?Diet compliance: yes ?Exercise: yes ? ?Mood d/w pt.  Still on SSRI.  Has been seen at the [place].  D/w pt.  ? ?Taking flomax at baseline.  Some days with slower stream, variable.  He is able to manage as is. ? ?Flu prev done.   ?Shingrix done.  ?PNA d/w pt.  Prev done at [facility].   ?Tetanus 2018 ?covid vaccine 2021 ?Colonoscopy 2016 per [place] [facility].   ?Prostate cancer screening and PSA options (with potential risks and benefits of testing vs not testing) were discussed along with recent recs/guidelines.  He declined testing PSA at this point. ?Advance directive- wife designated if patient were incapacitated.   ?AAA screen 2014.   ? ?Meds, vitals, and allergies reviewed.  ? ?PMH and SH reviewed ? ?ROS: Per HPI unless specifically indicated in ROS section  ? ?GEN: nad, alert and oriented ?HEENT: ncat ?NECK: supple w/o LA ?CV: rrr. ?PULM: ctab, no inc wob ?ABD: soft, +bs ?EXT: no edema ?SKIN: no acute rash ?

## 2021-10-03 NOTE — Patient Instructions (Signed)
Your labs look good/stable.  Avoid ibuprofen and aleve.  Update me as needed.  Take care.  Glad to see you. ?

## 2021-10-05 NOTE — Assessment & Plan Note (Signed)
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  He is continuing on sertraline as is and he will update me as needed. ?

## 2021-10-05 NOTE — Assessment & Plan Note (Signed)
Would continue Flomax. ?

## 2021-10-05 NOTE — Assessment & Plan Note (Signed)
Flu prev done.   ?Shingrix done.  ?PNA d/w pt.  Prev done at [facility].   ?Tetanus 2018 ?covid vaccine 2021 ?Colonoscopy 2016 per [facility].   ?Prostate cancer screening and PSA options (with potential risks and benefits of testing vs not testing) were discussed along with recent recs/guidelines.  He declined testing PSA at this point. ?Advance directive- wife designated if patient were incapacitated.   ?AAA screen 2014.   ?

## 2021-10-05 NOTE — Assessment & Plan Note (Signed)
Advance directive- wife designated if patient were incapacitated.  

## 2021-10-05 NOTE — Assessment & Plan Note (Signed)
At baseline creatinine approximately 1.5.  Discussed with patient about checking periodically.  Avoid NSAIDs. ?

## 2021-10-05 NOTE — Assessment & Plan Note (Signed)
Continue amlodipine and lisinopril.  Labs discussed with patient. 

## 2021-10-05 NOTE — Assessment & Plan Note (Signed)
Continue atorvastatin.  Continue work on diet and exercise. 

## 2021-10-26 ENCOUNTER — Telehealth (INDEPENDENT_AMBULATORY_CARE_PROVIDER_SITE_OTHER): Payer: PPO | Admitting: Family Medicine

## 2021-10-26 ENCOUNTER — Encounter: Payer: Self-pay | Admitting: Family Medicine

## 2021-10-26 ENCOUNTER — Telehealth: Payer: Self-pay | Admitting: Family Medicine

## 2021-10-26 DIAGNOSIS — J069 Acute upper respiratory infection, unspecified: Secondary | ICD-10-CM | POA: Insufficient documentation

## 2021-10-26 MED ORDER — GUAIFENESIN-CODEINE 100-10 MG/5ML PO SYRP: 5.0000 mL | ORAL_SOLUTION | Freq: Three times a day (TID) | ORAL | 0 refills | Status: DC | PRN

## 2021-10-26 NOTE — Assessment & Plan Note (Signed)
1 week, cough is occ productive (scant) No wheezing/sob/fever inst to do a home covid test and update me   Px cheratussin for cough with caution of sedation and habit  Fluids/rest  Update if not starting to improve in a week or if worsening   Watch for wheeze, sob, chest pain or worse cough and f/u  ER precautions reviewed

## 2021-10-26 NOTE — Telephone Encounter (Signed)
Pt called and stated he would like to be prescribed a medication for cough and chest congestion. He last saw Damita Dunnings on 5.15.2023 for a cpe, he has recently gotten sick after that appointment. Please return a call back when possible, thanks.  Callback Number: [phone]

## 2021-10-26 NOTE — Patient Instructions (Signed)
Drink fluids and rest  Do a home covid test and alert [place] if it is positive   Try robisussin DM or mucinex DM during the day  Cheratussin at night (caution of sedation and habit)   Update if not starting to improve in a week or if worsening  Alert [place] if wheezing, worse cough, fever or other symptoms  Follow up in office if not improving   If any severe symptoms please go to the ER

## 2021-10-26 NOTE — Telephone Encounter (Signed)
Called and spoke with patient and scheduled appt with Dr. Glori Bickers at 12:30 pm for VV.

## 2021-10-26 NOTE — Progress Notes (Signed)
Virtual Visit via Video Note  I connected with David Mcgee on 10/26/21 at 12:30 PM EDT by a video enabled telemedicine application and verified that I am speaking with the correct person using two identifiers.  Location: Patient: home  Provider: office    I discussed the limitations of evaluation and management by telemedicine and the availability of in person appointments. The patient expressed understanding and agreed to proceed.  Parties involved in encounter  Patient: David Mcgee  Provider:  Loura Pardon MD   Video failed today and the visit was done by phone   History of Present Illness: 76 yo pt of Dr Damita Dunnings presents for cough and congestion   Congestion and a bad cough for a week   Started like a head cold  Throat is sore from cough only  No fever  The cough gives him a head ache  (some episodes that he can't stop) Some fatigue   No sinus pain  No nasal symptoms    Mostly dry cough/ very little phlegm (little yellow tint and thick)  No wheezing  No sob or sob   Has been exp to daughter who had the same thing  She did not test pos for covid   He has had full blown covid twice  Is also immunized   No n/v/d  No loss of taste or smell   Otc: Chlorcedin hbp  Guaifenesin   Had some left over cough med with codeine   Has HTN  One kidney   Patient Active Problem List   Diagnosis Date Noted   Trigger finger 10/03/2021   Actinic keratosis 08/22/2020   Cough 06/10/2020   Nausea 10/20/2018   Dysuria 10/14/2018   Shoulder pain 07/02/2018   Health care maintenance 03/29/2017   Insomnia 03/29/2017   BPH (benign prostatic hyperplasia) 03/29/2017   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   Advance care planning 12/09/2014   Elevated serum creatinine 12/09/2014   Hyperglycemia 12/09/2014   Atypical chest pain 02/23/2014   PTSD (post-traumatic stress disorder) 01/24/2013   Hyperlipidemia 11/16/2011   Medicare annual wellness visit, subsequent 10/24/2011    PALPITATIONS 06/10/2009   LOW BACK PAIN, ACUTE 01/01/2009   CARCINOMA, BASAL CELL 10/01/2008   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 45% total function on right   Depression    ? panic attacks, takes sertraline & also sees psych q 3 months    GERD (gastroesophageal reflux disease)    H/O hiatal hernia    History of stress test    wnl   Hypertension    Insomnia    Palpitations    PTSD (post-traumatic stress disorder)    post [place]   Spinal cord cysts    Past Surgical History:  Procedure Laterality Date   BACK SURGERY     ESOPHAGOGASTRODUODENOSCOPY  06/1995   Dilitation bx negative (Dr. Epifanio Lesches)   Hernoiorraphy     Right; years ago   JOINT REPLACEMENT Bilateral    KNEE ARTHROPLASTY Right 08/18/2015   Procedure: COMPUTER ASSISTED TOTAL KNEE ARTHROPLASTY;  Surgeon: Dereck Leep, MD;  Location: [facility] ORS;  Service: Orthopedics;  Laterality: Right;   KNEE  ARTHROSCOPY  10/2000   Right Knee (Dr. Sabra Heck)   LUMBAR LAMINECTOMY/DECOMPRESSION MICRODISCECTOMY Right 12/09/2012   Procedure: LUMBAR LAMINECTOMY/DECOMPRESSION MICRODISCECTOMY 1 LEVEL;  Surgeon: Elaina Hoops, MD;  Location: [facility];  Service: Neurosurgery;  Laterality: Right;  LUMBAR LAMINECTOMY/DECOMPRESSION MICRODISCECTOMY 1 LEVEL   MOHS SURGERY  01/26/2017   nose   NEUROMA SURGERY Right 2000   Right foot Morton's neuroma excision   SHOULDER ARTHROSCOPY WITH SUBACROMIAL DECOMPRESSION AND BICEP TENDON REPAIR Left 07/04/2018   Procedure: SHOULDER ARTHROSCOPY WITH DEBRIDEMENT, DECOMPRESSION, REPAIR OF PARTIAL THICKNESS ROTATOR CUFF TEAR, BICEP TENODESIS WITH POSSIBLE DISTAL CLAVICLE EXCISION;  Surgeon: Corky Mull, MD;  Location: [facility]  ORS;  Service: Orthopedics;  Laterality: Left;   TONSILLECTOMY     TOTAL KNEE ARTHROPLASTY Bilateral 2013, 2016   Social History   Tobacco Use   Smoking status: Former    Packs/day: 1.50    Years: 9.00    Pack years: 13.50    Types: Cigarettes    Quit date: 05/22/1969    Years since quitting: 52.4   Smokeless tobacco: Never   Tobacco comments:    quit in 1972  Vaping Use   Vaping Use: Never used  Substance Use Topics   Alcohol use: No   Drug use: No   Family History  Problem Relation Age of Onset   Hypertension Mother    Diabetes Mother    Kidney failure Father    Hypertension Father    Obesity Sister    Diabetes Sister    Diabetes Brother    Hypertension Brother    Hyperlipidemia Brother    Stroke Brother    Depression Brother        ?   Colon cancer Neg Hx    Prostate cancer Neg Hx    Allergies  Allergen Reactions   Nsaids Other (See Comments)    Avoid due to kidney concern   Augmentin [Amoxicillin-Pot Clavulanate] Diarrhea    Tolerates plain amoxicillin ok   Terazosin    Azithromycin Nausea Only   Trazodone And Nefazodone     Abnormal dreams.    Current Outpatient Medications on File Prior to Visit  Medication Sig Dispense Refill   amLODipine (NORVASC) 10 MG tablet TAKE 1 TABLET BY MOUTH EVERYDAY AT BEDTIME 90 tablet 0   ammonium lactate (AMLACTIN) 12 % cream Apply 1 g topically as needed (itchy skin).     aspirin EC 81 MG tablet Take 81 mg by mouth daily.     atorvastatin (LIPITOR) 10 MG tablet Take 10 mg by mouth daily.     Carboxymethylcellulose Sodium (THERATEARS OP) Apply 1 drop to eye daily as needed (dry eyes).     cholecalciferol (VITAMIN D3) 25 MCG (1000 UT) tablet Take 1,000 Units by mouth daily.     docusate sodium (COLACE) 100 MG capsule Take 200 mg by mouth 2 (two) times daily as needed for mild constipation.     hydrocortisone (ANUSOL-HC) 2.5 % rectal cream Place 1 application. rectally daily as needed for hemorrhoids or anal itching.      lisinopril (PRINIVIL,ZESTRIL) 5 MG tablet Take 2.5 mg by mouth daily.     Melatonin 5 MG TABS Take 5 mg by mouth at bedtime as needed.     Multiple Vitamin (MULTIVITAMIN) tablet Take 1 tablet by mouth daily.     Omega-3 Fatty Acids (FISH OIL) 1000 MG CAPS Take 1,000 mg by mouth 2 (two) times daily.      sertraline (ZOLOFT) 100 MG tablet Take  100 mg by mouth daily.     tamsulosin (FLOMAX) 0.4 MG CAPS capsule Take 0.4 mg by mouth at bedtime.      No current facility-administered medications on file prior to visit.   Review of Systems  Constitutional:  Negative for chills, fever and malaise/fatigue.  HENT:  Positive for sore throat. Negative for congestion, ear pain and sinus pain.   Eyes:  Negative for blurred vision, discharge and redness.  Respiratory:  Positive for cough and sputum production. Negative for shortness of breath, wheezing and stridor.   Cardiovascular:  Negative for chest pain, palpitations and leg swelling.  Gastrointestinal:  Negative for abdominal pain, diarrhea, nausea and vomiting.  Musculoskeletal:  Negative for myalgias.  Skin:  Negative for rash.  Neurological:  Positive for headaches. Negative for dizziness.      Observations/Objective: Pt seen briefly on screen-nl appearing  Not distressed  Voice is not hoarse Occ clears throat  No wheeze or sob with speech  Does not cough during interview  Nl cognition/good historian  Nl mood   Assessment and Plan: Problem List Items Addressed This Visit       Respiratory   Viral URI with cough    1 week, cough is occ productive (scant) No wheezing/sob/fever inst to do a home covid test and update me   Px cheratussin for cough with caution of sedation and habit  Fluids/rest  Update if not starting to improve in a week or if worsening   Watch for wheeze, sob, chest pain or worse cough and f/u  ER precautions reviewed          Follow Up Instructions:   Drink fluids and rest  Do a home covid test and alert  [place] if it is positive   Try robisussin DM or mucinex DM during the day  Cheratussin at night (caution of sedation and habit)   Update if not starting to improve in a week or if worsening  Alert [place] if wheezing, worse cough, fever or other symptoms  Follow up in office if not improving   If any severe symptoms please go to the ER I discussed the assessment and treatment plan with the patient. The patient was provided an opportunity to ask questions and all were answered. The patient agreed with the plan and demonstrated an understanding of the instructions.   The patient was advised to call back or seek an in-person evaluation if the symptoms worsen or if the condition fails to improve as anticipated.  I provided 16 minutes of non-face-to-face time during this encounter.   Loura Pardon, MD

## 2021-10-29 IMAGING — MR MR LUMBAR SPINE W/O CM
5 series · 31 of 48 positions shown · non-contrast
Comparison: MRI of the lumbar spine September 18, 2012.

CLINICAL DATA: History of lumbar laminectomy and cyst removal. Low
back pain. Radiates down left leg to foot with numbness and
tingling.

EXAM:
MRI LUMBAR SPINE WITHOUT CONTRAST
TECHNIQUE: Multiplanar, multisequence MR imaging of the lumbar spine was
performed. No intravenous contrast was administered.

[Series 5: T2 · sagittal · 4.0mm · 0.81mm/px · 6 of 17 slices shown (1 of 2)]
[im 1/17]
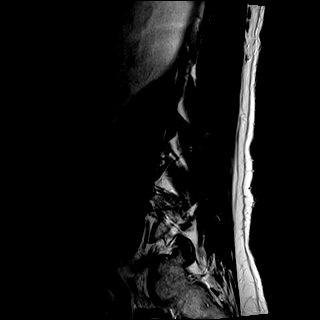
[im 4/17]
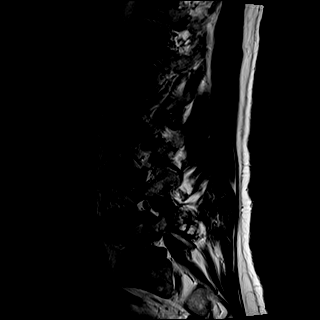
[im 7/17]
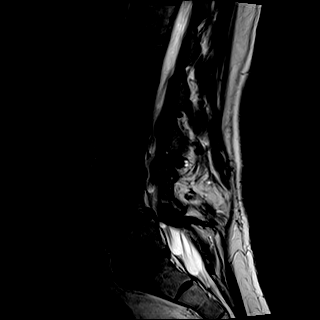
[im 10/17]
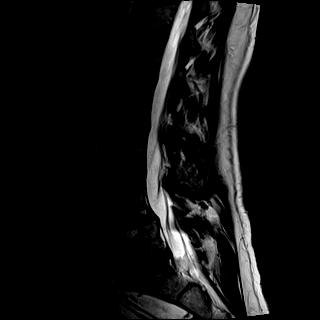
[im 13/17]
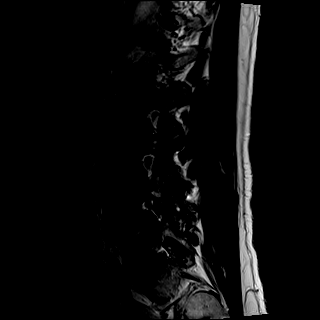
[im 17/17]
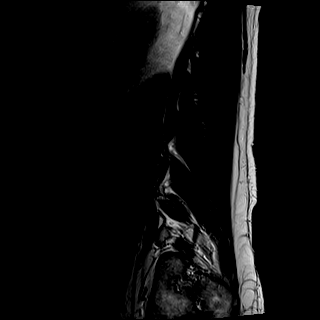

[Series 6: T1 · sagittal · 4.0mm · 0.81mm/px · 6 of 17 slices shown (1 of 2)]
[im 1/17]
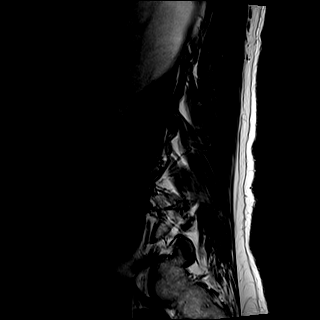
[im 4/17]
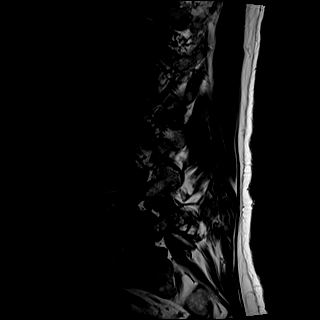
[im 7/17]
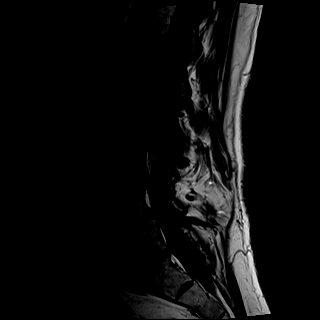
[im 10/17]
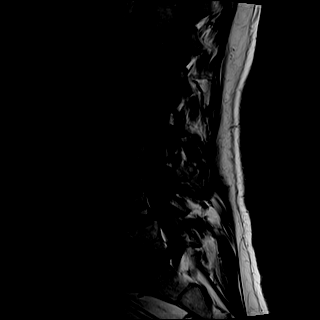
[im 13/17]
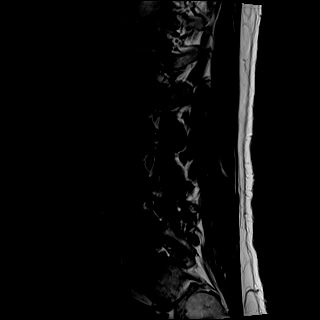
[im 17/17]
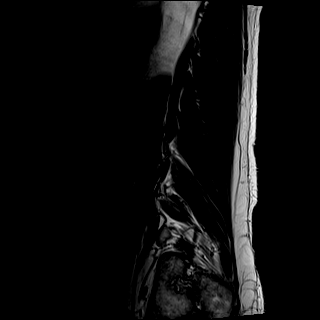

[Series 7: STIR · sagittal · 4.0mm · 0.41mm/px · 1 of 17 slices shown]
[im 1/17]
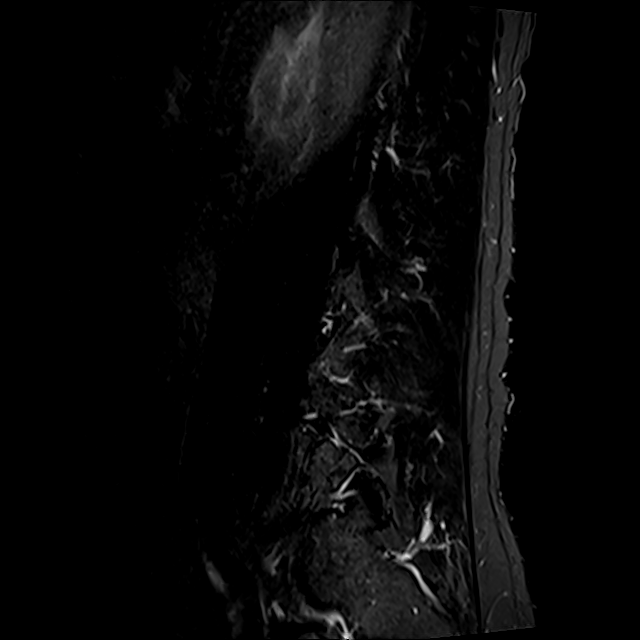

[Series 8: T2 · axial · 4.0mm · 0.78mm/px · z∈[-161,+69]mm · 9 of 40 slices shown (2 of 2)]
[im 1/40]
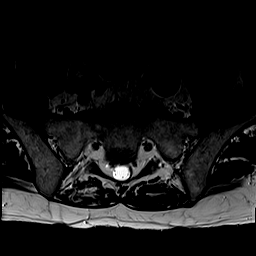
[im 6/40]
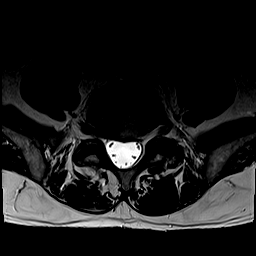
[im 12/40]
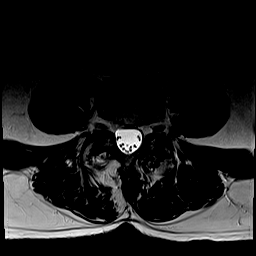
[im 17/40]
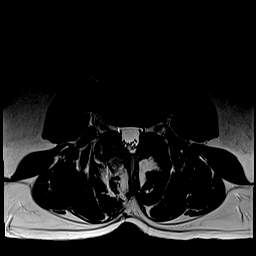
[im 20/40]
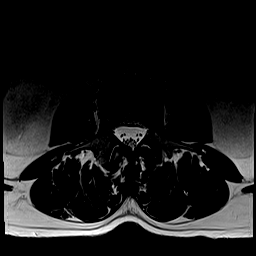
[im 23/40]
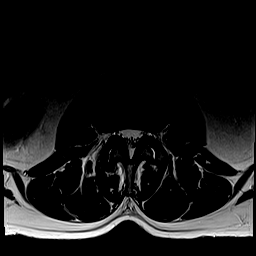
[im 28/40]
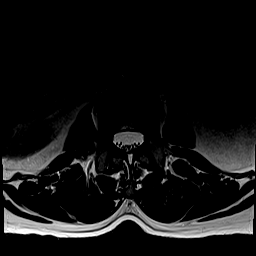
[im 34/40]
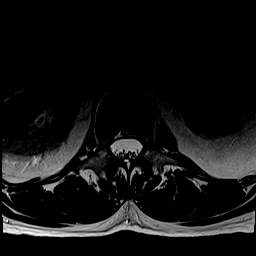
[im 40/40]
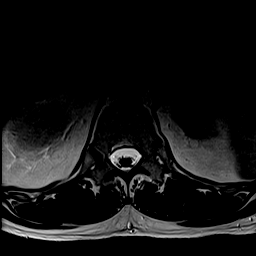

[Series 9: T1 · axial · 4.0mm · 0.39mm/px · z∈[-161,+69]mm · 9 of 40 slices shown (2 of 2)]
[im 1/40]
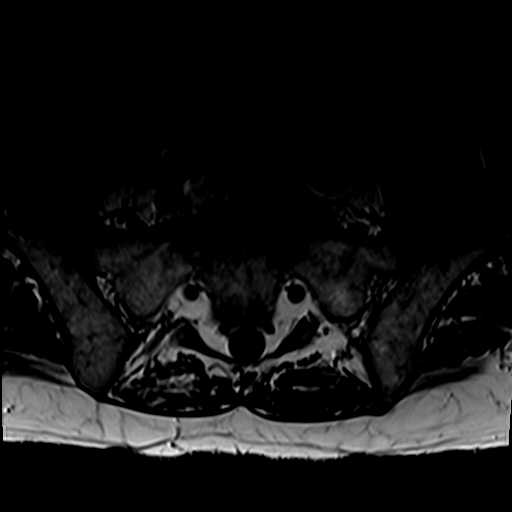
[im 6/40]
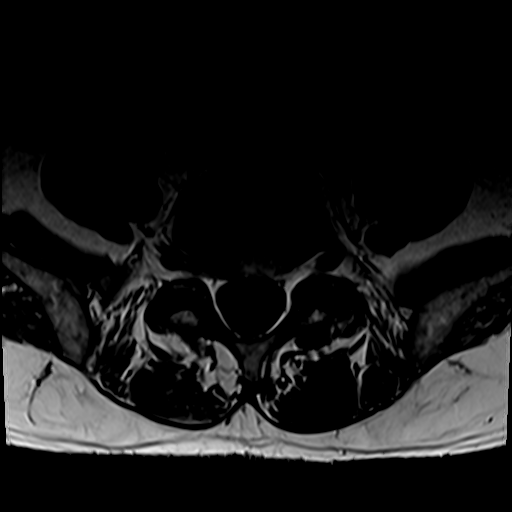
[im 12/40]
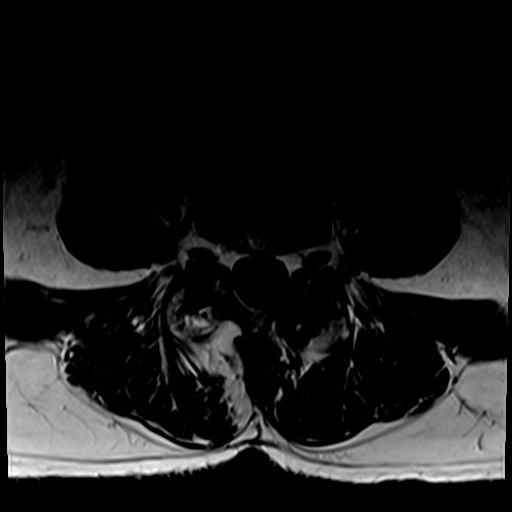
[im 17/40]
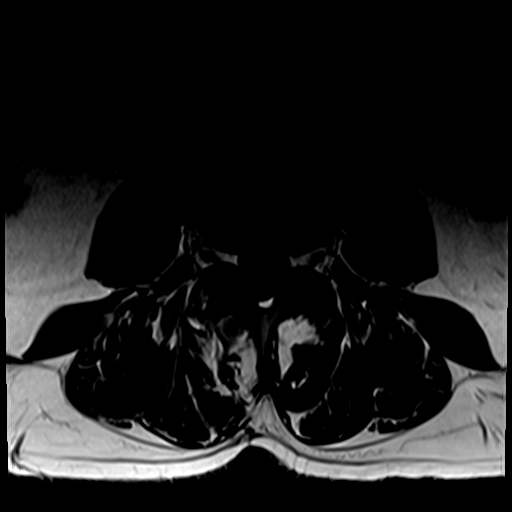
[im 20/40]
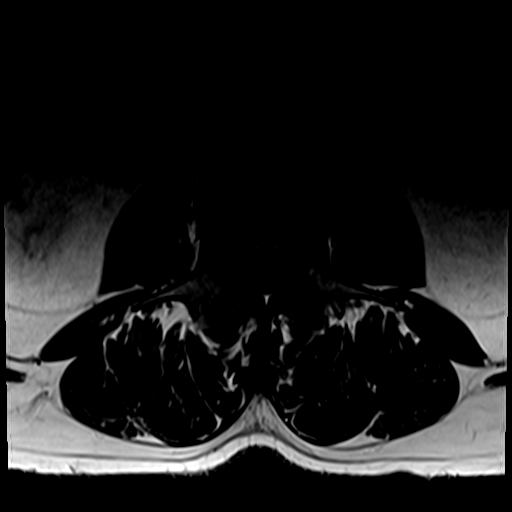
[im 23/40]
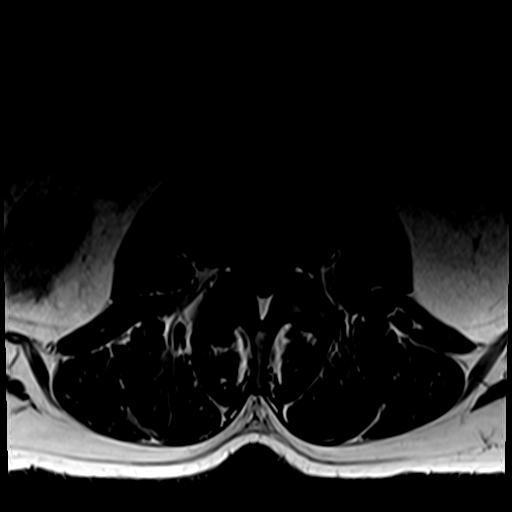
[im 28/40]
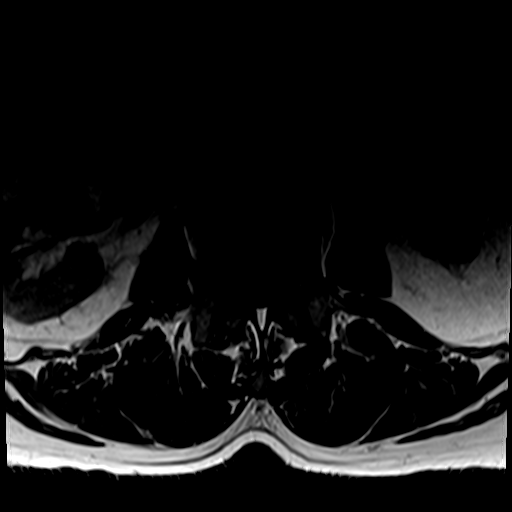
[im 34/40]
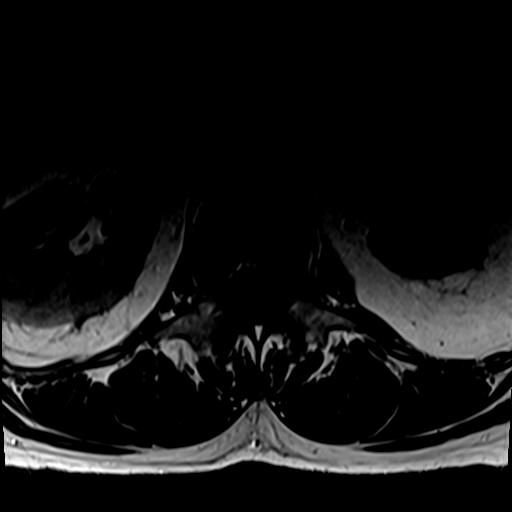
[im 40/40]
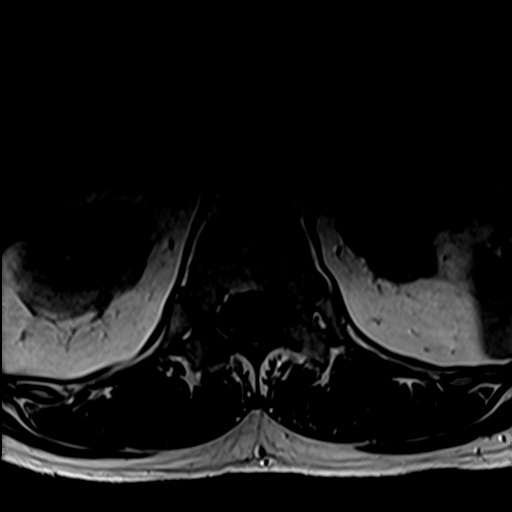

[31 of 48 positions shown; findings below may reference images not displayed]

FINDINGS: Segmentation: Standard. The inferior-most fully formed
intervertebral disc is labeled L5-S1.

Alignment:  Grade 1 anterolisthesis of L4 on L5.

Vertebrae: Vertebral body heights are maintained. Degenerative
endplate signal changes involving the anterior superior L5 endplate.
No evidence of acute fracture, discitis/osteomyelitis, or suspicious
bone lesion. Mild edema in the L4-L5 disc is favored degenerative.

Conus medullaris and cauda equina: Conus extends to the L1 level.
Conus and cauda equina appear normal.

Paraspinal and other soft tissues: Small left kidney with multiple
cysts, suboptimally evaluated.

Disc levels:

T12-L1: No significant disc protrusion, foraminal stenosis, or canal
stenosis.

L1-L2: No significant disc protrusion, foraminal stenosis, or canal
stenosis.

L2-L3: Small broad-based disc bulge and moderate bilateral facet
hypertrophy. Mild canal and bilateral subarticular recess stenosis
without significant foraminal stenosis.

L3-L4: Small broad-based disc bulge and left greater than right
facet hypertrophy. Postsurgical changes of right laminectomy at L4
for cyst resection. No evidence of recurrence cyst. Mild bilateral
subarticular recess stenosis without significant central canal or
foraminal stenosis.

L4-L5: Degenerative disc edema with grade 1 anterolisthesis of L4 on
L5. Uncovering of the disc this with slight right eccentric disc
bulge. Mild-to-moderate left and mild right foraminal stenosis.
Moderate bilateral subarticular recess stenosis without significant
central canal stenosis.

L5-S1: Degenerative disc desiccation height loss. Small disc bulge
and endplate spurring. Mild left foraminal stenosis. No significant
canal or right foraminal stenosis.
IMPRESSION: 1. Degenerative disc disease at L4-L5 with grade 1 anterolisthesis,
moderate bilateral subarticular recess stenosis, and
mild-to-moderate left foraminal stenosis.
2. Milder multilevel degenerative change, as detailed above.
3. Right laminectomy at L4 for cyst removal without evidence of
recurrence cyst.

## 2021-11-02 DIAGNOSIS — M6752 Plica syndrome, left knee: Secondary | ICD-10-CM | POA: Diagnosis not present

## 2021-11-02 DIAGNOSIS — M25562 Pain in left knee: Secondary | ICD-10-CM | POA: Diagnosis not present

## 2021-11-02 DIAGNOSIS — Z96653 Presence of artificial knee joint, bilateral: Secondary | ICD-10-CM | POA: Diagnosis not present

## 2021-11-17 ENCOUNTER — Telehealth: Payer: Self-pay | Admitting: Family Medicine

## 2021-11-17 NOTE — Telephone Encounter (Signed)
Pt called and said he thinks he might have limes disease, he was bitten by a tick about a month ago. He has been seen since. I offered an appt but he asked if he can get a call back from the nurse first. David Mcgee is [phone]

## 2021-11-17 NOTE — Telephone Encounter (Signed)
Called patient. He is not having any kind of sx right now anymore. But was having some issues with his knee. I scheduled him a f/u appt for tomorrow to come in and discuss and maybe have labs done.

## 2021-11-18 ENCOUNTER — Ambulatory Visit (INDEPENDENT_AMBULATORY_CARE_PROVIDER_SITE_OTHER): Payer: PPO | Admitting: Family Medicine

## 2021-11-18 ENCOUNTER — Encounter: Payer: Self-pay | Admitting: Family Medicine

## 2021-11-18 VITALS — BP 124/78 | HR 70 | Temp 97.9°F | Ht 74.0 in | Wt 227.0 lb

## 2021-11-18 DIAGNOSIS — M2550 Pain in unspecified joint: Secondary | ICD-10-CM

## 2021-11-18 DIAGNOSIS — R059 Cough, unspecified: Secondary | ICD-10-CM | POA: Diagnosis not present

## 2021-11-18 DIAGNOSIS — M25569 Pain in unspecified knee: Secondary | ICD-10-CM

## 2021-11-18 MED ORDER — BENZONATATE 200 MG PO CAPS: 200.0000 mg | ORAL_CAPSULE | Freq: Three times a day (TID) | ORAL | 1 refills | Status: DC | PRN

## 2021-11-18 NOTE — Progress Notes (Signed)
Tick bite L medial knee.  Removed late May of 2023.  Area looks better.  Prev with sig knee pain and swelling.  No fevers.  No rash other than irritation at the bite site.    He didn't recall a specific injury but had pain on ROM, enough to call for help.  That happened unexpectedly one day on a deck at home.  That pain was quick in onset.  That was early June.    Tick bite predates the knee pain, unclear but possible if related.    His knee is clearly better in the meantime.  He was prev using a cane but not now.  He took prednisone, unclear if better from that or otherwise.  He saw ortho in the meantime.    Meds, vitals, and allergies reviewed.   ROS: Per HPI unless specifically indicated in ROS section   L knee slightly puffy at baseline, usually L>R, still a little puffy now.      No fevers.    ?local inflammatory reaction that is tick incidental, ie not a tick born disease?  Still with come cough at night>day.

## 2021-11-18 NOTE — Patient Instructions (Addendum)
Go to the lab on the way out.   If you have mychart we'll likely use that to update you.    Take care.  Glad to see you. Update me as needed.  You could have had a local inflammatory reaction without a tick-born disease.   Tessalon for cough.  Update me as needed.

## 2021-11-20 NOTE — Assessment & Plan Note (Signed)
Lungs are clear.  Likely postinfectious cough.  He still has some cough more at night than during the day.  Discussed using Tessalon if needed.  Routine cautions given to patient.  He can update me as needed.  He should continue to improve.

## 2021-11-20 NOTE — Assessment & Plan Note (Signed)
Discussed options.  He could have had an inflammatory process related to a tick bite that was not a specific tickborne illness.  I suspect that is the most likely issue here.  That would have explained the local swelling.  We talked about checking for tickborne illnesses and we talked about IgM versus IgG antibody testing and the significance thereof.  At this point still okay for outpatient follow-up.  He is clearly better in the meantime.  See notes on labs.  32 minutes were devoted to patient care in this encounter (this includes time spent reviewing the patient's file/history, interviewing and examining the patient, counseling/reviewing plan with patient).

## 2021-11-23 LAB — ROCKY MTN SPOTTED FVR ABS PNL(IGG+IGM)
RMSF IgG: NOT DETECTED
RMSF IgM: NOT DETECTED

## 2021-11-23 LAB — B. BURGDORFI ANTIBODIES: B burgdorferi Ab IgG+IgM: <0.9 index

## 2022-05-08 ENCOUNTER — Ambulatory Visit (INDEPENDENT_AMBULATORY_CARE_PROVIDER_SITE_OTHER): Payer: PPO

## 2022-05-08 VITALS — Ht 74.5 in | Wt 235.0 lb

## 2022-05-08 DIAGNOSIS — Z0000 Encounter for general adult medical examination without abnormal findings: Secondary | ICD-10-CM

## 2022-05-08 NOTE — Progress Notes (Signed)
I connected with Willeen Niece today by telephone and verified that I am speaking with the correct person using two identifiers. Location patient: home Location provider: work Persons participating in the virtual visit: Heather Streeper, Glenna Durand LPN.   I discussed the limitations, risks, security and privacy concerns of performing an evaluation and management service by telephone and the availability of in person appointments. I also discussed with the patient that there may be a patient responsible charge related to this service. The patient expressed understanding and verbally consented to this telephonic visit.    Interactive audio and video telecommunications were attempted between this provider and patient, however failed, due to patient having technical difficulties OR patient did not have access to video capability.  We continued and completed visit with audio only.     Vital signs may be patient reported or missing.  Subjective:   David Mcgee is a 76 y.o. male who presents for Medicare Annual/Subsequent preventive examination.  Review of Systems     Cardiac Risk Factors include: advanced age (>45mn, >>19women);dyslipidemia;hypertension;male gender     Objective:    Today's Vitals   05/08/22 1412  Weight: 235 lb (106.6 kg)  Height: 6' 2.5" (1.892 m)  PainSc: 2    Body mass index is 29.77 kg/m.     05/08/2022    2:18 PM 05/04/2021    2:08 PM 04/30/2020    2:00 PM 05/13/2019    2:22 PM 05/13/2019    2:21 PM 07/04/2018    7:13 AM 06/27/2018   10:46 AM  Advanced Directives  Does Patient Have a Medical Advance Directive? Yes Yes Yes No No Yes Yes  Type of AParamedicof [place]urn[place]Living will [place]Living will [place]Living will   [place]Port JeffersonLiving will Living will;Healthcare Power of Attorney  Does patient want to make changes to medical advance directive?  Yes (MAU/Ambulatory/Procedural  Areas - Information given)    No - Patient declined No - Patient declined  Copy of [place] Chart? No - copy requested  No - copy requested   No - copy requested No - copy requested  Would patient like information on creating a medical advance directive?    No - Patient declined No - Patient declined      Current Medications (verified) Outpatient Encounter Medications as of 05/08/2022  Medication Sig   amLODipine (NORVASC) 10 MG tablet TAKE 1 TABLET BY MOUTH EVERYDAY AT BEDTIME   ammonium lactate (AMLACTIN) 12 % cream Apply 1 g topically as needed (itchy skin).   aspirin EC 81 MG tablet Take 81 mg by mouth daily.   atorvastatin (LIPITOR) 10 MG tablet Take 10 mg by mouth daily.   benzonatate (TESSALON) 200 MG capsule Take 1 capsule (200 mg total) by mouth 3 (three) times daily as needed.   Carboxymethylcellulose Sodium (THERATEARS OP) Apply 1 drop to eye daily as needed (dry eyes).   cholecalciferol (VITAMIN D3) 25 MCG (1000 UT) tablet Take 1,000 Units by mouth daily.   docusate sodium (COLACE) 100 MG capsule Take 200 mg by mouth 2 (two) times daily as needed for mild constipation.   hydrocortisone (ANUSOL-HC) 2.5 % rectal cream Place 1 application. rectally daily as needed for hemorrhoids or anal itching.   lisinopril (PRINIVIL,ZESTRIL) 5 MG tablet Take 2.5 mg by mouth daily.   Melatonin 5 MG TABS Take 5 mg by mouth at bedtime as needed.   Multiple Vitamin (MULTIVITAMIN) tablet  Take 1 tablet by mouth daily.   Omega-3 Fatty Acids (FISH OIL) 1000 MG CAPS Take 1,000 mg by mouth 2 (two) times daily.    sertraline (ZOLOFT) 100 MG tablet Take 100 mg by mouth daily.   tamsulosin (FLOMAX) 0.4 MG CAPS capsule Take 0.4 mg by mouth at bedtime.    No facility-administered encounter medications on file as of 05/08/2022.    Allergies (verified) Nsaids, Augmentin [amoxicillin-pot clavulanate], Terazosin, Azithromycin, and Trazodone and nefazodone   History: Past Medical  History:  Diagnosis Date   Arthritis    back, knees, hands - OA   Cancer (HCC)    Skin, not melanoma   Chronic kidney disease    loss of kidney function - L - since 1990's, h/o of renal calculi. 45% total function on right   Depression    ? panic attacks, takes sertraline & also sees psych q 3 months    GERD (gastroesophageal reflux disease)    H/O hiatal hernia    History of stress test    wnl   Hypertension    Insomnia    Palpitations    PTSD (post-traumatic stress disorder)    post [place]   Spinal cord cysts    Past Surgical History:  Procedure Laterality Date   BACK SURGERY     ESOPHAGOGASTRODUODENOSCOPY  06/1995   Dilitation bx negative (Dr. Epifanio Lesches)   Hernoiorraphy     Right; years ago   JOINT REPLACEMENT Bilateral    KNEE ARTHROPLASTY Right 08/18/2015   Procedure: COMPUTER ASSISTED TOTAL KNEE ARTHROPLASTY;  Surgeon: Dereck Leep, MD;  Location: [facility] ORS;  Service: Orthopedics;  Laterality: Right;   KNEE ARTHROSCOPY  10/2000   Right Knee (Dr. Sabra Heck)   LUMBAR LAMINECTOMY/DECOMPRESSION MICRODISCECTOMY Right 12/09/2012   Procedure: LUMBAR LAMINECTOMY/DECOMPRESSION MICRODISCECTOMY 1 LEVEL;  Surgeon: Elaina Hoops, MD;  Location: [facility];  Service: Neurosurgery;  Laterality: Right;  LUMBAR LAMINECTOMY/DECOMPRESSION MICRODISCECTOMY 1 LEVEL   MOHS SURGERY  01/26/2017   nose   NEUROMA SURGERY Right 2000   Right foot Morton's neuroma excision   SHOULDER ARTHROSCOPY WITH SUBACROMIAL DECOMPRESSION AND BICEP TENDON REPAIR Left 07/04/2018   Procedure: SHOULDER ARTHROSCOPY WITH DEBRIDEMENT, DECOMPRESSION, REPAIR OF PARTIAL THICKNESS ROTATOR CUFF TEAR, BICEP TENODESIS WITH POSSIBLE DISTAL CLAVICLE EXCISION;  Surgeon: Corky Mull, MD;  Location: [facility];  Service: Orthopedics;  Laterality: Left;   TONSILLECTOMY     TOTAL KNEE ARTHROPLASTY Bilateral 2013, 2016   Family History  Problem Relation Age of Onset   Hypertension Mother    Diabetes Mother    Kidney failure Father     Hypertension Father    Obesity Sister    Diabetes Sister    Diabetes Brother    Hypertension Brother    Hyperlipidemia Brother    Stroke Brother    Depression Brother        ?   Colon cancer Neg Hx    Prostate cancer Neg Hx    Social History   Socioeconomic History   Marital status: Married    Spouse name: joyce   Number of children: Not on file   Years of education: Not on file   Highest education level: Not on file  Occupational History   Occupation: Retired     Fish farm manager: [employer]   Occupation: deacon at [employer]  Tobacco Use   Smoking status: Former    Packs/day: 1.50    Years: 9.00    Total pack years: 13.50    Types: Cigarettes  Quit date: 05/22/1969    Years since quitting: 52.9   Smokeless tobacco: Never   Tobacco comments:    quit in 1972  Vaping Use   Vaping Use: Never used  Substance and Sexual Activity   Alcohol use: No   Drug use: No   Sexual activity: Yes  Other Topics Concern   Not on file  Social History Narrative   Married (second marriage 1989) and lives with wife   One child, 4 grandkids (2 step and 2 biological).   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  Agent orange exposure.   Retired from [employer], Engineer, production   Social Determinants of Radio broadcast assistant Strain: [place]  (05/08/2022)   Overall Financial Resource Strain (CARDIA)    Difficulty of Paying Living Expenses: Not hard at all  Food Insecurity: No Food Insecurity (05/08/2022)   Hunger Vital Sign    Worried About Running Out of Food in the Last Year: Never true    [place] in the Last Year: Never true  Transportation Needs: No Transportation Needs (05/08/2022)   PRAPARE - Hydrologist (Medical): No    Lack of Transportation (Non-Medical): No  Physical Activity: Sufficiently Active (05/08/2022)   Exercise Vital Sign    Days of Exercise per Week: 3 days    Minutes of Exercise per Session: 50 min  Stress: No Stress Concern Present  (05/08/2022)   [place]    Feeling of Stress : Not at all  Social Connections: Socially Integrated (05/04/2021)   Social Connection and Isolation Panel [NHANES]    Frequency of Communication with Friends and Family: More than three times a week    Frequency of Social Gatherings with Friends and Family: More than three times a week    Attends Religious Services: More than 4 times per year    Active Member of [employer] or Organizations: Yes    Attends Music therapist: More than 4 times per year    Marital Status: Married    Tobacco Counseling Counseling given: Not Answered Tobacco comments: quit in 1972   Clinical Intake:  Pre-visit preparation completed: Yes  Pain : 0-10 Pain Score: 2  Pain Type: Acute pain Pain Location: Wrist Pain Orientation: Left Pain Descriptors / Indicators: Stabbing Pain Onset: More than a month ago Pain Frequency: Constant     Nutritional Status: BMI 25 -29 Overweight Nutritional Risks: None Diabetes: No  How often do you need to have someone help you when you read instructions, pamphlets, or other written materials from your doctor or pharmacy?: 1 - Never  Diabetic? no  Interpreter Needed?: No  Information entered by :: NAllen LPN   Activities of Daily Living    05/08/2022    2:20 PM  In your present state of health, do you have any difficulty performing the following activities:  Hearing? 0  Comment has hearing aids  Vision? 1  Comment has cataracts  Difficulty concentrating or making decisions? 0  Walking or climbing stairs? 0  Dressing or bathing? 0  Doing errands, shopping? 0  Preparing Food and eating ? N  Using the Toilet? N  In the past six months, have you accidently leaked urine? N  Do you have problems with loss of bowel control? N  Managing your Medications? N  Managing your Finances? N  Housekeeping or managing your Housekeeping? N     Patient Care Team: Elsie Stain  S, MD as PCP - General (Family Medicine) Tamsen Meek, MD as Referring Physician (Dermatology) Ree Edman, MD as Referring Physician (Dermatology) Jennette Kettle, MD as Referring Physician (Internal Medicine)  Indicate any recent Medical Services you may have received from other than [facility] providers in the past year (date may be approximate).     Assessment:   This is a routine wellness examination for Leemon.  Hearing/Vision screen Vision Screening - Comments:: Regular eye exams, Dr. Chong Sicilian and the [facility]  Dietary issues and exercise activities discussed: Current Exercise Habits: Home exercise routine, Type of exercise: Other - see comments (recumbent bike), Time (Minutes): 45, Frequency (Times/Week): 3, Weekly Exercise (Minutes/Week): 135   Goals Addressed             This Visit's Progress    Patient Stated       05/08/2022, wants to lose weight       Depression Screen    05/08/2022    2:19 PM 10/26/2021   12:37 PM 05/04/2021    2:12 PM 04/30/2020    2:02 PM 04/08/2019    8:55 AM 03/29/2018   11:07 AM 03/21/2017   10:28 AM  PHQ 2/9 Scores  PHQ - 2 Score 1 0 0 4 2 0 0  PHQ- 9 Score    6  0 0    Fall Risk    05/08/2022    2:19 PM 10/26/2021   12:37 PM 05/04/2021    2:10 PM 04/30/2020    2:01 PM 04/15/2019   12:44 PM  Fall Risk   Falls in the past year? 0 0 0 1 0  Comment    fell on incline [place] Telephone Survey: data to providers prior to load  Number falls in past yr: 0 0 0 0   Injury with Fall? 0 0 0 0   Risk for fall due to : Medication side effect  No Fall Risks Medication side effect   Follow up Falls prevention discussed;Education provided;Falls evaluation completed  Falls prevention discussed Falls evaluation completed;Falls prevention discussed     FALL RISK PREVENTION PERTAINING TO THE HOME:  Any stairs in or around the home? Yes  If so, are there any without handrails? No  Home free of loose  throw rugs in walkways, pet beds, electrical cords, etc? Yes  Adequate lighting in your home to reduce risk of falls? Yes   ASSISTIVE DEVICES UTILIZED TO PREVENT FALLS:  Life alert? No  Use of a cane, walker or w/c? No  Grab bars in the bathroom? Yes  Shower chair or bench in shower? Yes  Elevated toilet seat or a handicapped toilet? No   TIMED UP AND GO:  Was the test performed? No .       Cognitive Function:    04/30/2020    2:09 PM 03/29/2018   11:07 AM 03/21/2017   10:28 AM 12/01/2015    8:38 AM  MMSE - Mini Mental State Exam  Orientation to time '5 5 5 5  '$ Orientation to Place '5 5 5 5  '$ Registration '3 3 3 3  '$ Attention/ Calculation 5 0 0 0  Recall '3 3 3 3  '$ Language- name 2 objects  0 0 0  Language- repeat '1 1 1 1  '$ Language- follow 3 step command  '3 3 3  '$ Language- read & follow direction  0 0 0  Write a sentence  0 0 0  Copy design  0 0 0  Total score  20 20 20  05/08/2022    2:22 PM  6CIT Screen  What Year? 0 points  What month? 0 points  What time? 0 points  Count back from 20 0 points  Months in reverse 0 points  Repeat phrase 2 points  Total Score 2 points    Immunizations Immunization History  Administered Date(s) Administered   Influenza, High Dose Seasonal PF 02/13/2014, 01/10/2019   Influenza,inj,quad, With Preservative 02/19/2018   Influenza-Unspecified 02/28/2017, 02/14/2018, 01/26/2020, 03/20/2021   Moderna Covid-19 Vaccine Bivalent Booster 60yr & up 07/06/2021   PFIZER(Purple Top)SARS-COV-2 Vaccination 05/28/2019, 06/18/2019, 01/29/2020, 10/20/2020   Pneumococcal Conjugate-13 09/21/2013, 10/01/2013   Pneumococcal Polysaccharide-23 05/22/2010, 09/22/2010, 01/24/2011   Td 10/01/2008   Tdap 03/05/2017   Zoster Recombinat (Shingrix) 01/19/2020, 03/20/2020   Zoster, Live 05/22/2013    TDAP status: Up to date  Flu Vaccine status: Up to date  Pneumococcal vaccine status: Up to date  Covid-19 vaccine status: Completed  vaccines  Qualifies for Shingles Vaccine? Yes   Zostavax completed Yes   Shingrix Completed?: Yes  Screening Tests Health Maintenance  Topic Date Due   INFLUENZA VACCINE  12/20/2021   COVID-19 Vaccine (6 - 2023-24 season) 01/20/2022   Medicare Annual Wellness (AWV)  05/04/2022   DTaP/Tdap/Td (3 - Td or Tdap) 03/06/2027   Pneumonia Vaccine 76 Years old  Completed   Hepatitis C Screening  Completed   Zoster Vaccines- Shingrix  Completed   HPV VACCINES  Aged Out   COLONOSCOPY (Pts [phone] coverage will need to be confirmed)  Discontinued    Health Maintenance  Health Maintenance Due  Topic Date Due   INFLUENZA VACCINE  12/20/2021   COVID-19 Vaccine (6 - 2023-24 season) 01/20/2022   Medicare Annual Wellness (AWV)  05/04/2022    Colorectal cancer screening: No longer required.   Lung Cancer Screening: (Low Dose CT Chest recommended if Age 77-80ears, 30 pack-year currently smoking OR have quit w/in 15years.) does not qualify.   Lung Cancer Screening Referral: no  Additional Screening:  Hepatitis C Screening: does qualify; Completed 12/01/2015  Vision Screening: Recommended annual ophthalmology exams for early detection of glaucoma and other disorders of the eye. Is the patient up to date with their annual eye exam?  Yes  Who is the provider or what is the name of the office in which the patient attends annual [facility] exams[facility] Sicilianf pt is not established with a provider, would they like to be referred to a provider to establish care? No .   Dental Screening: Recommended annual dental exams for proper oral hygiene  Community Resource Referral / Chronic Care Management: CRR required this visit?  No   CCM required this visit?  No      Plan:     I have personally reviewed and noted the following in the patient's chart:   Medical and social history Use of alcohol, tobacco or illicit drugs  Current medications and supplements including opioid  prescriptions. Patient is not currently taking opioid prescriptions. Functional ability and status Nutritional status Physical activity Advanced directives List of other physicians Hospitalizations, surgeries, and ER visits in previous 12 months Vitals Screenings to include cognitive, depression, and falls Referrals and appointments  In addition, I have reviewed and discussed with patient certain preventive protocols, quality metrics, and best practice recommendations. A written personalized care plan for preventive services as well as general preventive health recommendations were provided to patient.     NiKellie SimmeringLPN   [id number] Nurse  Notes: none  Due to this being a virtual visit, the after visit summary with patients personalized plan was offered to patient via mail or my-chart.  Patient would like to access on my-chart

## 2022-05-08 NOTE — Patient Instructions (Signed)
[name] , Thank you for taking time to come for your Medicare Wellness Visit. I appreciate your ongoing commitment to your health goals. Please review the following plan we discussed and let me know if I can assist you in the future.   These are the goals we discussed:  Goals      Increase physical activity     Starting 03/29/2018, I will continue to exercise for at least 60 min 3 days per week.      Patient Stated     04/30/2020, I will continue to go to the gym 3 days a week for 1 hour.      Patient Stated     Would like to drink more water     Patient Stated     05/08/2022, wants to lose weight        This is a list of the screening recommended for you and due dates:  Health Maintenance  Topic Date Due   COVID-19 Vaccine (6 - 2023-24 season) 01/20/2022   Medicare Annual Wellness Visit  05/09/2023   DTaP/Tdap/Td vaccine (3 - Td or Tdap) 03/06/2027   Pneumonia Vaccine  Completed   Flu Shot  Completed   Hepatitis C Screening: USPSTF Recommendation to screen - Ages 18-79 yo.  Completed   Zoster (Shingles) Vaccine  Completed   HPV Vaccine  Aged Out   Colon Cancer Screening  Discontinued    Advanced directives: Please bring a copy of your POA (Power of Attorney) and/or Living Will to your next appointment.   Conditions/risks identified: none  Next appointment: Follow up in one year for your annual wellness visit.   Preventive Care 34 Years and Older, Male  Preventive care refers to lifestyle choices and visits with your health care provider that can promote health and wellness. What does preventive care include? A yearly physical exam. This is also called an annual well check. Dental exams once or twice a year. Routine eye exams. Ask your health care provider how often you should have your eyes checked. Personal lifestyle choices, including: Daily care of your teeth and gums. Regular physical activity. Eating a healthy diet. Avoiding tobacco and drug use. Limiting  alcohol use. Practicing safe sex. Taking low doses of aspirin every day. Taking vitamin and mineral supplements as recommended by your health care provider. What happens during an annual well check? The services and screenings done by your health care provider during your annual well check will depend on your age, overall health, lifestyle risk factors, and family history of disease. Counseling  Your health care provider may ask you questions about your: Alcohol use. Tobacco use. Drug use. Emotional well-being. Home and relationship well-being. Sexual activity. Eating habits. History of falls. Memory and ability to understand (cognition). Work and work Statistician. Screening  You may have the following tests or measurements: Height, weight, and BMI. Blood pressure. Lipid and cholesterol levels. These may be checked every 5 years, or more frequently if you are over 11 years old. Skin check. Lung cancer screening. You may have this screening every year starting at age 21 if you have a 30-pack-year history of smoking and currently smoke or have quit within the past 15 years. Fecal occult blood test (FOBT) of the stool. You may have this test every year starting at age 66. Flexible sigmoidoscopy or colonoscopy. You may have a sigmoidoscopy every 5 years or a colonoscopy every 10 years starting at age 62. Prostate cancer screening. Recommendations will vary depending on your  family history and other risks. Hepatitis C blood test. Hepatitis B blood test. Sexually transmitted disease (STD) testing. Diabetes screening. This is done by checking your blood sugar (glucose) after you have not eaten for a while (fasting). You may have this done every 1-3 years. Abdominal aortic aneurysm (AAA) screening. You may need this if you are a current or former smoker. Osteoporosis. You may be screened starting at age 5 if you are at high risk. Talk with your health care provider about your test results,  treatment options, and if necessary, the need for more tests. Vaccines  Your health care provider may recommend certain vaccines, such as: Influenza vaccine. This is recommended every year. Tetanus, diphtheria, and acellular pertussis (Tdap, Td) vaccine. You may need a Td booster every 10 years. Zoster vaccine. You may need this after age 37. Pneumococcal 13-valent conjugate (PCV13) vaccine. One dose is recommended after age 68. Pneumococcal polysaccharide (PPSV23) vaccine. One dose is recommended after age 51. Talk to your health care provider about which screenings and vaccines you need and how often you need them. This information is not intended to replace advice given to you by your health care provider. Make sure you discuss any questions you have with your health care provider. Document Released: 06/04/2015 Document Revised: 01/26/2016 Document Reviewed: 03/09/2015 Elsevier Interactive Patient Education  2017 [employer] [employer] Prevention in the Home Falls can cause injuries. They can happen to people of all ages. There are many things you can do to make your home safe and to help prevent falls. What can I do on the outside of my home? Regularly fix the edges of walkways and driveways and fix any cracks. Remove anything that might make you trip as you walk through a door, such as a raised step or threshold. Trim any bushes or trees on the path to your home. Use bright outdoor lighting. Clear any walking paths of anything that might make someone trip, such as rocks or tools. Regularly check to see if handrails are loose or broken. Make sure that both sides of any steps have handrails. Any raised decks and porches should have guardrails on the edges. Have any leaves, snow, or ice cleared regularly. Use sand or salt on walking paths during winter. Clean up any spills in your garage right away. This includes oil or grease spills. What can I do in the bathroom? Use night  lights. Install grab bars by the toilet and in the tub and shower. Do not use towel bars as grab bars. Use non-skid mats or decals in the tub or shower. If you need to sit down in the shower, use a plastic, non-slip stool. Keep the floor dry. Clean up any water that spills on the floor as soon as it happens. Remove soap buildup in the tub or shower regularly. Attach bath mats securely with double-sided non-slip rug tape. Do not have throw rugs and other things on the floor that can make you trip. What can I do in the bedroom? Use night lights. Make sure that you have a light by your bed that is easy to reach. Do not use any sheets or blankets that are too big for your bed. They should not hang down onto the floor. Have a firm chair that has side arms. You can use this for support while you get dressed. Do not have throw rugs and other things on the floor that can make you trip. What can I do in the kitchen? Clean up  any spills right away. Avoid walking on wet floors. Keep items that you use a lot in easy-to-reach places. If you need to reach something above you, use a strong step stool that has a grab bar. Keep electrical cords out of the way. Do not use floor polish or wax that makes floors slippery. If you must use wax, use non-skid floor wax. Do not have throw rugs and other things on the floor that can make you trip. What can I do with my stairs? Do not leave any items on the stairs. Make sure that there are handrails on both sides of the stairs and use them. Fix handrails that are broken or loose. Make sure that handrails are as long as the stairways. Check any carpeting to make sure that it is firmly attached to the stairs. Fix any carpet that is loose or worn. Avoid having throw rugs at the top or bottom of the stairs. If you do have throw rugs, attach them to the floor with carpet tape. Make sure that you have a light switch at the top of the stairs and the bottom of the stairs. If  you do not have them, ask someone to add them for you. What else can I do to help prevent falls? Wear shoes that: Do not have high heels. Have rubber bottoms. Are comfortable and fit you well. Are closed at the toe. Do not wear sandals. If you use a stepladder: Make sure that it is fully opened. Do not climb a closed stepladder. Make sure that both sides of the stepladder are locked into place. Ask someone to hold it for you, if possible. Clearly mark and make sure that you can see: Any grab bars or handrails. First and last steps. Where the edge of each step is. Use tools that help you move around (mobility aids) if they are needed. These include: Canes. Walkers. Scooters. Crutches. Turn on the lights when you go into a dark area. Replace any light bulbs as soon as they burn out. Set up your furniture so you have a clear path. Avoid moving your furniture around. If any of your floors are uneven, fix them. If there are any pets around you, be aware of where they are. Review your medicines with your doctor. Some medicines can make you feel dizzy. This can increase your chance of falling. Ask your doctor what other things that you can do to help prevent falls. This information is not intended to replace advice given to you by your health care provider. Make sure you discuss any questions you have with your health care provider. Document Released: 03/04/2009 Document Revised: 10/14/2015 Document Reviewed: 06/12/2014 Elsevier Interactive Patient Education  2017 [employer].

## 2022-08-29 DIAGNOSIS — M47812 Spondylosis without myelopathy or radiculopathy, cervical region: Secondary | ICD-10-CM | POA: Diagnosis not present

## 2022-08-29 DIAGNOSIS — M542 Cervicalgia: Secondary | ICD-10-CM | POA: Diagnosis not present

## 2022-08-29 DIAGNOSIS — M19011 Primary osteoarthritis, right shoulder: Secondary | ICD-10-CM | POA: Diagnosis not present

## 2022-08-29 DIAGNOSIS — M25511 Pain in right shoulder: Secondary | ICD-10-CM | POA: Diagnosis not present

## 2022-09-06 DIAGNOSIS — H8111 Benign paroxysmal vertigo, right ear: Secondary | ICD-10-CM | POA: Diagnosis not present

## 2022-09-06 DIAGNOSIS — R293 Abnormal posture: Secondary | ICD-10-CM | POA: Diagnosis not present

## 2022-09-06 DIAGNOSIS — M542 Cervicalgia: Secondary | ICD-10-CM | POA: Diagnosis not present

## 2022-09-08 DIAGNOSIS — R293 Abnormal posture: Secondary | ICD-10-CM | POA: Diagnosis not present

## 2022-09-08 DIAGNOSIS — M542 Cervicalgia: Secondary | ICD-10-CM | POA: Diagnosis not present

## 2022-09-08 DIAGNOSIS — H8111 Benign paroxysmal vertigo, right ear: Secondary | ICD-10-CM | POA: Diagnosis not present

## 2022-09-15 DIAGNOSIS — M542 Cervicalgia: Secondary | ICD-10-CM | POA: Diagnosis not present

## 2022-09-15 DIAGNOSIS — H8111 Benign paroxysmal vertigo, right ear: Secondary | ICD-10-CM | POA: Diagnosis not present

## 2022-09-15 DIAGNOSIS — R293 Abnormal posture: Secondary | ICD-10-CM | POA: Diagnosis not present

## 2022-09-22 DIAGNOSIS — H8111 Benign paroxysmal vertigo, right ear: Secondary | ICD-10-CM | POA: Diagnosis not present

## 2022-09-22 DIAGNOSIS — R293 Abnormal posture: Secondary | ICD-10-CM | POA: Diagnosis not present

## 2022-09-22 DIAGNOSIS — M542 Cervicalgia: Secondary | ICD-10-CM | POA: Diagnosis not present

## 2022-10-06 DIAGNOSIS — H8111 Benign paroxysmal vertigo, right ear: Secondary | ICD-10-CM | POA: Diagnosis not present

## 2022-10-06 DIAGNOSIS — R293 Abnormal posture: Secondary | ICD-10-CM | POA: Diagnosis not present

## 2022-10-06 DIAGNOSIS — M542 Cervicalgia: Secondary | ICD-10-CM | POA: Diagnosis not present

## 2022-10-13 DIAGNOSIS — M542 Cervicalgia: Secondary | ICD-10-CM | POA: Diagnosis not present

## 2022-10-13 DIAGNOSIS — R293 Abnormal posture: Secondary | ICD-10-CM | POA: Diagnosis not present

## 2022-10-13 DIAGNOSIS — H8111 Benign paroxysmal vertigo, right ear: Secondary | ICD-10-CM | POA: Diagnosis not present

## 2022-10-20 DIAGNOSIS — R293 Abnormal posture: Secondary | ICD-10-CM | POA: Diagnosis not present

## 2022-10-20 DIAGNOSIS — M542 Cervicalgia: Secondary | ICD-10-CM | POA: Diagnosis not present

## 2022-10-20 DIAGNOSIS — H8111 Benign paroxysmal vertigo, right ear: Secondary | ICD-10-CM | POA: Diagnosis not present

## 2022-10-27 DIAGNOSIS — M542 Cervicalgia: Secondary | ICD-10-CM | POA: Diagnosis not present

## 2022-10-27 DIAGNOSIS — H8111 Benign paroxysmal vertigo, right ear: Secondary | ICD-10-CM | POA: Diagnosis not present

## 2022-10-27 DIAGNOSIS — R293 Abnormal posture: Secondary | ICD-10-CM | POA: Diagnosis not present

## 2022-11-03 DIAGNOSIS — H8111 Benign paroxysmal vertigo, right ear: Secondary | ICD-10-CM | POA: Diagnosis not present

## 2022-11-03 DIAGNOSIS — M542 Cervicalgia: Secondary | ICD-10-CM | POA: Diagnosis not present

## 2022-11-03 DIAGNOSIS — R293 Abnormal posture: Secondary | ICD-10-CM | POA: Diagnosis not present

## 2022-11-10 DIAGNOSIS — H8111 Benign paroxysmal vertigo, right ear: Secondary | ICD-10-CM | POA: Diagnosis not present

## 2022-11-10 DIAGNOSIS — M542 Cervicalgia: Secondary | ICD-10-CM | POA: Diagnosis not present

## 2022-11-10 DIAGNOSIS — R293 Abnormal posture: Secondary | ICD-10-CM | POA: Diagnosis not present

## 2022-11-15 DIAGNOSIS — H8111 Benign paroxysmal vertigo, right ear: Secondary | ICD-10-CM | POA: Diagnosis not present

## 2022-11-15 DIAGNOSIS — M542 Cervicalgia: Secondary | ICD-10-CM | POA: Diagnosis not present

## 2022-11-15 DIAGNOSIS — R293 Abnormal posture: Secondary | ICD-10-CM | POA: Diagnosis not present

## 2022-11-27 ENCOUNTER — Other Ambulatory Visit: Payer: Self-pay | Admitting: Family Medicine

## 2022-11-27 DIAGNOSIS — E785 Hyperlipidemia, unspecified: Secondary | ICD-10-CM

## 2022-11-27 DIAGNOSIS — R739 Hyperglycemia, unspecified: Secondary | ICD-10-CM

## 2022-12-01 DIAGNOSIS — R293 Abnormal posture: Secondary | ICD-10-CM | POA: Diagnosis not present

## 2022-12-01 DIAGNOSIS — M542 Cervicalgia: Secondary | ICD-10-CM | POA: Diagnosis not present

## 2022-12-01 DIAGNOSIS — H8111 Benign paroxysmal vertigo, right ear: Secondary | ICD-10-CM | POA: Diagnosis not present

## 2022-12-07 ENCOUNTER — Other Ambulatory Visit (INDEPENDENT_AMBULATORY_CARE_PROVIDER_SITE_OTHER): Payer: PPO

## 2022-12-07 DIAGNOSIS — E785 Hyperlipidemia, unspecified: Secondary | ICD-10-CM

## 2022-12-07 DIAGNOSIS — R739 Hyperglycemia, unspecified: Secondary | ICD-10-CM | POA: Diagnosis not present

## 2022-12-07 LAB — COMPREHENSIVE METABOLIC PANEL
ALT: 22 U/L (ref 0–53)
AST: 23 U/L (ref 0–37)
Albumin: 4.5 g/dL (ref 3.5–5.2)
Alkaline Phosphatase: 45 U/L (ref 39–117)
BUN: 17 mg/dL (ref 6–23)
CO2: 29 mEq/L (ref 19–32)
Calcium: 9.8 mg/dL (ref 8.4–10.5)
Chloride: 103 mEq/L (ref 96–112)
Creatinine, Ser: 1.56 mg/dL — ABNORMAL HIGH (ref 0.40–1.50)
GFR: 42.35 mL/min — ABNORMAL LOW (ref >60.00)
Glucose, Bld: 120 mg/dL — ABNORMAL HIGH (ref 70–99)
Potassium: 4.2 mEq/L (ref 3.5–5.1)
Sodium: 138 mEq/L (ref 135–145)
Total Bilirubin: 1.5 mg/dL — ABNORMAL HIGH (ref 0.2–1.2)
Total Protein: 7.4 g/dL (ref 6.0–8.3)

## 2022-12-07 LAB — LIPID PANEL
Cholesterol: 117 mg/dL (ref 0–200)
HDL: 39 mg/dL — ABNORMAL LOW (ref >39.00)
LDL Cholesterol: 58 mg/dL (ref 0–99)
NonHDL: 77.96
Total CHOL/HDL Ratio: 3
Triglycerides: 102 mg/dL (ref 0.0–149.0)
VLDL: 20.4 mg/dL (ref 0.0–40.0)

## 2022-12-07 LAB — HEMOGLOBIN A1C: Hgb A1c MFr Bld: 6 % (ref 4.6–6.5)

## 2022-12-08 DIAGNOSIS — H8111 Benign paroxysmal vertigo, right ear: Secondary | ICD-10-CM | POA: Diagnosis not present

## 2022-12-08 DIAGNOSIS — M542 Cervicalgia: Secondary | ICD-10-CM | POA: Diagnosis not present

## 2022-12-08 DIAGNOSIS — R293 Abnormal posture: Secondary | ICD-10-CM | POA: Diagnosis not present

## 2022-12-11 ENCOUNTER — Encounter: Payer: Self-pay | Admitting: Family Medicine

## 2022-12-11 ENCOUNTER — Ambulatory Visit: Payer: PPO | Admitting: Family Medicine

## 2022-12-11 ENCOUNTER — Ambulatory Visit (INDEPENDENT_AMBULATORY_CARE_PROVIDER_SITE_OTHER)
Admission: RE | Admit: 2022-12-11 | Discharge: 2022-12-11 | Disposition: A | Discharge status: Home / Self Care | Payer: PPO | Source: Ambulatory Visit | Attending: Family Medicine | Admitting: Family Medicine

## 2022-12-11 VITALS — BP 118/80 | HR 60 | Temp 97.9°F | Ht 74.5 in | Wt 234.0 lb

## 2022-12-11 DIAGNOSIS — Z0000 Encounter for general adult medical examination without abnormal findings: Secondary | ICD-10-CM

## 2022-12-11 DIAGNOSIS — N400 Enlarged prostate without lower urinary tract symptoms: Secondary | ICD-10-CM

## 2022-12-11 DIAGNOSIS — R7989 Other specified abnormal findings of blood chemistry: Secondary | ICD-10-CM | POA: Diagnosis not present

## 2022-12-11 DIAGNOSIS — Z7189 Other specified counseling: Secondary | ICD-10-CM

## 2022-12-11 DIAGNOSIS — I10 Essential (primary) hypertension: Secondary | ICD-10-CM | POA: Diagnosis not present

## 2022-12-11 DIAGNOSIS — R1319 Other dysphagia: Secondary | ICD-10-CM

## 2022-12-11 DIAGNOSIS — M25572 Pain in left ankle and joints of left foot: Secondary | ICD-10-CM

## 2022-12-11 DIAGNOSIS — E785 Hyperlipidemia, unspecified: Secondary | ICD-10-CM | POA: Diagnosis not present

## 2022-12-11 DIAGNOSIS — M7989 Other specified soft tissue disorders: Secondary | ICD-10-CM | POA: Diagnosis not present

## 2022-12-11 DIAGNOSIS — M7732 Calcaneal spur, left foot: Secondary | ICD-10-CM | POA: Diagnosis not present

## 2022-12-11 DIAGNOSIS — M19072 Primary osteoarthritis, left ankle and foot: Secondary | ICD-10-CM | POA: Diagnosis not present

## 2022-12-11 DIAGNOSIS — F4310 Post-traumatic stress disorder, unspecified: Secondary | ICD-10-CM

## 2022-12-11 DIAGNOSIS — R5383 Other fatigue: Secondary | ICD-10-CM | POA: Diagnosis not present

## 2022-12-11 MED ORDER — PREDNISONE 10 MG PO TABS
ORAL_TABLET | ORAL | 0 refills | Status: DC
Start: 2022-12-11 — End: 2022-12-20

## 2022-12-11 MED ORDER — OMEPRAZOLE 20 MG PO CPDR: 20.0000 mg | DELAYED_RELEASE_CAPSULE | Freq: Every day | ORAL | Status: AC

## 2022-12-11 NOTE — Patient Instructions (Addendum)
Let me know if you don't get a call about seeing GI.   Take care.  Glad to see you. Go to the lab on the way out.   If you have mychart we'll likely use that to update you.    Prednisone with food.  Let me know if that isn't helping.

## 2022-12-11 NOTE — Progress Notes (Unsigned)
I have personally reviewed the Medicare Annual Wellness questionnaire and have noted 1. The patient's medical and social history 2. Their use of alcohol, tobacco or illicit drugs 3. Their current medications and supplements 4. The patient's functional ability including ADL's, fall risks, home safety risks and hearing or visual             impairment. 5. Diet and physical activities 6. Evidence for depression or mood disorders  The patients weight, height, BMI have been recorded in the chart and visual acuity is per eye clinic.  I have made referrals, counseling and provided education to the patient based review of the above and I have provided the pt with a written personalized care plan for preventive services.  Provider list updated- see scanned forms.  Routine anticipatory guidance given to patient.  See health maintenance. The possibility exists that previously documented standard health maintenance information may have been brought forward from a previous encounter into this note.  If needed, that same information has been updated to reflect the current situation based on today's encounter.    Flu prev done.   Shingrix done.  PNA d/w pt.  Prev done at [facility].   Tetanus 2018 RSV prev done.  covid vaccine [facility]   Prostate cancer screening and PSA options (with potential risks and benefits of testing vs not testing) were discussed along with recent recs/guidelines.  He declined testing PSA at this point. Advance directive- wife designated if patient were incapacitated.   AAA screen 2014.   Cognitive function addressed- see scanned forms- and if abnormal then additional documentation follows.   In addition to [facility] Well[facility], follow up visit for the below conditions:  Hypertension:               Using medication without problems or lightheadedness: yes Chest pain with exertion:no Edema: some L ankle edema, not on the R.    Short of breath:no Labs  d/w pt.     Cr at baseline ~1.5, d/w pt. not changed significantly from previous/recent years.   Elevated Cholesterol: Using medications without problems: yes Muscle aches: no Diet compliance: yes Exercise: yes   Mood d/w pt.  Still on SSRI.  Has been seen at the [place].  D/w pt. No SI.  Routine cautions d/w pt.     Taking flomax at baseline.  Some days with slower stream, variable.  He is able to manage as is.  Not profoundly fatigued but notes that he can go to sleep easily.  No high risk events, ie with driving.  He reported having abnormal TSH at the [place].   Recheck pending.   H/o vertigo resolved with PT tx.    He is having more trouble with dysphagia, in spite of PPI.  Having to vomit to clear it.  Worse with greasy foods.  Not choking.  D/w pt about GI eval.    L ankle pain.  Going on for months.  Not trauma.  Medial malleolus ttp.  Sore if the sheet lays on it.  No h/o gout known.   PMH and SH reviewed  Meds, vitals, and allergies reviewed.   ROS: Per HPI.  Unless specifically indicated otherwise in HPI, the patient denies:  General: fever. Eyes: acute vision changes ENT: sore throat Cardiovascular: chest pain Respiratory: SOB GI: vomiting GU: dysuria Musculoskeletal: acute back pain Derm: acute rash Neuro: acute motor dysfunction Psych: worsening mood Endocrine: polydipsia Heme: bleeding Allergy: hayfever  GEN: nad, alert  and oriented HEENT: mucous membranes moist NECK: supple w/o LA CV: rrr. PULM: ctab, no inc wob ABD: soft, +bs EXT: no edema SKIN: no acute rash Left ankle puffy medially.  Not tender to palpation at the medial and lateral malleolus.  Calcaneus nontender.  Midfoot nontender.  Skin well-perfused.  No erythema.

## 2022-12-12 LAB — CBC WITH DIFFERENTIAL/PLATELET
Basophils Absolute: 0 10*3/uL (ref 0.0–0.1)
Basophils Relative: 0.7 % (ref 0.0–3.0)
Eosinophils Absolute: 0.1 10*3/uL (ref 0.0–0.7)
Eosinophils Relative: 2 % (ref 0.0–5.0)
HCT: 47.4 % (ref 39.0–52.0)
Hemoglobin: 15.9 g/dL (ref 13.0–17.0)
Lymphocytes Relative: 31 % (ref 12.0–46.0)
Lymphs Abs: 2.1 10*3/uL (ref 0.7–4.0)
MCHC: 33.6 g/dL (ref 30.0–36.0)
MCV: 89.5 fl (ref 78.0–100.0)
Monocytes Absolute: 0.5 10*3/uL (ref 0.1–1.0)
Monocytes Relative: 7.5 % (ref 3.0–12.0)
Neutro Abs: 4.1 10*3/uL (ref 1.4–7.7)
Neutrophils Relative %: 58.8 % (ref 43.0–77.0)
Platelets: 163 10*3/uL (ref 150.0–400.0)
RBC: 5.29 Mil/uL (ref 4.22–5.81)
RDW: 13.4 % (ref 11.5–15.5)
WBC: 6.9 10*3/uL (ref 4.0–10.5)

## 2022-12-12 LAB — TSH: TSH: 4.06 u[IU]/mL (ref 0.35–5.50)

## 2022-12-12 LAB — URIC ACID: Uric Acid, Serum: 4.6 mg/dL (ref 4.0–7.8)

## 2022-12-14 DIAGNOSIS — R1319 Other dysphagia: Secondary | ICD-10-CM | POA: Insufficient documentation

## 2022-12-14 DIAGNOSIS — M25572 Pain in left ankle and joints of left foot: Secondary | ICD-10-CM | POA: Insufficient documentation

## 2022-12-14 DIAGNOSIS — R5383 Other fatigue: Secondary | ICD-10-CM | POA: Insufficient documentation

## 2022-12-14 NOTE — Assessment & Plan Note (Signed)
Continue work on diet and exercise.  Continue atorvastatin. 

## 2022-12-14 NOTE — Assessment & Plan Note (Signed)
See notes on labs. 

## 2022-12-14 NOTE — Assessment & Plan Note (Signed)
Flu prev done.   Shingrix done.  PNA d/w pt.  Prev done at [facility].   Tetanus 2018 RSV prev done.  covid vaccine prev [facility]   Prostate cancer screening and PSA options (with potential risks and benefits of testing vs not testing) were discussed along with recent recs/guidelines.  He declined testing PSA at this point. Advance directive- wife designated if patient were incapacitated.   AAA screen 2014.   Cognitive function addressed- see scanned forms- and if abnormal then additional documentation follows.

## 2022-12-14 NOTE — Assessment & Plan Note (Signed)
Still on SSRI.  Has been seen at the [place].  D/w pt. No SI.  Routine cautions d/w pt.   continue sertraline as is.

## 2022-12-14 NOTE — Assessment & Plan Note (Signed)
Cr at baseline ~1.5, d/w pt. not changed significantly from previous/recent years.

## 2022-12-14 NOTE — Assessment & Plan Note (Signed)
-   Continue Flomax 

## 2022-12-14 NOTE — Assessment & Plan Note (Signed)
Continue amlodipine lisinopril.  Continue work on diet and exercise.

## 2022-12-14 NOTE — Assessment & Plan Note (Signed)
He is having more trouble with dysphagia, in spite of PPI.  Having to vomit to clear it.  Worse with greasy foods.  Not choking.  D/w pt about GI eval.   cautions discussed with patient.  Refer.

## 2022-12-14 NOTE — Assessment & Plan Note (Signed)
Check uric acid level.  Start prednisone.  Check plain films.  Routine cautions given to patient.  He agrees with plan.  Steroid cautions discussed.

## 2022-12-14 NOTE — Assessment & Plan Note (Signed)
Advance directive- wife designated if patient were incapacitated.  

## 2022-12-15 DIAGNOSIS — H8111 Benign paroxysmal vertigo, right ear: Secondary | ICD-10-CM | POA: Diagnosis not present

## 2022-12-15 DIAGNOSIS — M542 Cervicalgia: Secondary | ICD-10-CM | POA: Diagnosis not present

## 2022-12-15 DIAGNOSIS — R293 Abnormal posture: Secondary | ICD-10-CM | POA: Diagnosis not present

## 2022-12-20 ENCOUNTER — Encounter: Payer: Self-pay | Admitting: Family Medicine

## 2022-12-20 ENCOUNTER — Telehealth (INDEPENDENT_AMBULATORY_CARE_PROVIDER_SITE_OTHER): Payer: PPO | Admitting: Family Medicine

## 2022-12-20 VITALS — BP 148/78 | HR 69 | Temp 98.9°F | Ht 74.5 in | Wt 234.0 lb

## 2022-12-20 DIAGNOSIS — U071 COVID-19: Secondary | ICD-10-CM | POA: Diagnosis not present

## 2022-12-20 MED ORDER — MOLNUPIRAVIR EUA 200MG CAPSULE: 4.0000 | ORAL_CAPSULE | Freq: Two times a day (BID) | ORAL | 0 refills | Status: AC

## 2022-12-20 NOTE — Progress Notes (Unsigned)
Patient ID: David Mcgee, male   DOB: 24-Aug-1945, 77 y.o.   MRN: [id number]   Virtual Visit via Video Note  I connected with David Mcgee on 12/20/22 at  5:30 PM EDT by a video enabled telemedicine application and verified that I am speaking with the correct person using two identifiers.  Location patient: home Location provider:work or home office Persons participating in the virtual visit: patient, provider  I discussed the limitations of evaluation and management by telemedicine and the availability of in person appointments. The patient expressed understanding and agreed to proceed.   HPI:  David Mcgee has COVID.  He did home test earlier today which came back positive.  He developed yesterday some fatigue and bodyaches along with nasal congestion and cough.  Felt slightly better today.  Denies any nausea, vomiting, or diarrhea.  Denies any dyspnea.  No chronic lung disease.  Chronic problems include hypertension, GERD, BPH, hyperlipidemia, chronic kidney disease with baseline GFR around 42.  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  ROS: See pertinent positives and negatives per HPI.  Past Medical History:  Diagnosis Date   Arthritis    back, knees, hands - OA   Cancer (HCC)    Skin, not melanoma   Chronic kidney disease    loss of kidney function - L - since 1990's, h/o of renal calculi. 45% total function on right   Depression    ? panic attacks, takes sertraline & also sees psych q 3 months    GERD (gastroesophageal reflux disease)    H/O hiatal hernia    History of stress test    wnl   Hypertension    Insomnia    Palpitations    PTSD (post-traumatic stress disorder)    post [place]   Spinal cord cysts     Past Surgical History:  Procedure Laterality Date   BACK SURGERY     ESOPHAGOGASTRODUODENOSCOPY  06/1995   Dilitation bx negative (Dr. Sharen Hint)   Hernoiorraphy     Right; years ago   JOINT REPLACEMENT Bilateral    KNEE ARTHROPLASTY Right 08/18/2015   Procedure: COMPUTER ASSISTED  TOTAL KNEE ARTHROPLASTY;  Surgeon: Donato Heinz, MD;  Location: [facility] ORS;  Service: Orthopedics;  Laterality: Right;   KNEE ARTHROSCOPY  10/2000   Right Knee (Dr. Hyacinth Meeker)   LUMBAR LAMINECTOMY/DECOMPRESSION MICRODISCECTOMY Right 12/09/2012   Procedure: LUMBAR LAMINECTOMY/DECOMPRESSION MICRODISCECTOMY 1 LEVEL;  Surgeon: Mariam Dollar, MD;  Location: [facility];  Service: Neurosurgery;  Laterality: Right;  LUMBAR LAMINECTOMY/DECOMPRESSION MICRODISCECTOMY 1 LEVEL   MOHS SURGERY  01/26/2017   nose   NEUROMA SURGERY Right 2000   Right foot Morton's neuroma excision   SHOULDER ARTHROSCOPY WITH SUBACROMIAL DECOMPRESSION AND BICEP TENDON REPAIR Left 07/04/2018   Procedure: SHOULDER ARTHROSCOPY WITH DEBRIDEMENT, DECOMPRESSION, REPAIR OF PARTIAL THICKNESS ROTATOR CUFF TEAR, BICEP TENODESIS WITH POSSIBLE DISTAL CLAVICLE EXCISION;  Surgeon: Christena Flake, MD;  Location: [facility] OR[facility];  Service: Orthopedics;  Laterality: Left;   TONSILLECTOMY     TOTAL KNEE ARTHROPLASTY Bilateral 2013, 2016    Family History  Problem Relation Age of Onset   Hypertension Mother    Diabetes Mother    Kidney failure Father    Hypertension Father    Obesity Sister    Diabetes Sister    Diabetes Brother    Hypertension Brother    Hyperlipidemia Brother    Stroke Brother    Depression Brother        ?   Colon cancer Neg Hx  Prostate cancer Neg Hx     SOCIAL HX: Non-smoker   Current Outpatient Medications:    amLODipine (NORVASC) 10 MG tablet, TAKE 1 TABLET BY MOUTH EVERYDAY AT BEDTIME, Disp: 90 tablet, Rfl: 0   ammonium lactate (AMLACTIN) 12 % cream, Apply 1 g topically as needed (itchy skin)., Disp: , Rfl:    aspirin EC 81 MG tablet, Take 81 mg by mouth daily., Disp: , Rfl:    atorvastatin (LIPITOR) 10 MG tablet, Take 10 mg by mouth daily., Disp: , Rfl:    Carboxymethylcellulose Sodium (THERATEARS OP), Apply 1 drop to eye daily as needed (dry eyes)., Disp: , Rfl:    cholecalciferol (VITAMIN D3) 25 MCG (1000 UT)  tablet, Take 1,000 Units by mouth daily., Disp: , Rfl:    docusate sodium (COLACE) 100 MG capsule, Take 200 mg by mouth 2 (two) times daily as needed for mild constipation., Disp: , Rfl:    hydrocortisone (ANUSOL-HC) 2.5 % rectal cream, Place 1 application. rectally daily as needed for hemorrhoids or anal itching., Disp: , Rfl:    lisinopril (PRINIVIL,ZESTRIL) 5 MG tablet, Take 2.5 mg by mouth daily., Disp: , Rfl:    Melatonin 5 MG TABS, Take 5 mg by mouth at bedtime as needed., Disp: , Rfl:    molnupiravir EUA (LAGEVRIO) 200 mg CAPS capsule, Take 4 capsules (800 mg total) by mouth 2 (two) times daily for 5 days., Disp: 40 capsule, Rfl: 0   Multiple Vitamin (MULTIVITAMIN) tablet, Take 1 tablet by mouth daily., Disp: , Rfl:    Omega-3 Fatty Acids (FISH OIL) 1000 MG CAPS, Take 1,000 mg by mouth 2 (two) times daily. , Disp: , Rfl:    omeprazole (PRILOSEC) 20 MG capsule, Take 1 capsule (20 mg total) by mouth daily., Disp: , Rfl:    sertraline (ZOLOFT) 100 MG tablet, Take 100 mg by mouth daily., Disp: , Rfl:    tamsulosin (FLOMAX) 0.4 MG CAPS capsule, Take 0.4 mg by mouth at bedtime. , Disp: , Rfl:   EXAM:  VITALS per patient if applicable:  GENERAL: alert, oriented, appears well and in no acute distress  HEENT: atraumatic, conjunttiva clear, no obvious abnormalities on inspection of external nose and ears  NECK: normal movements of the head and neck  LUNGS: on inspection no signs of respiratory distress, breathing rate appears normal, no obvious gross SOB, gasping or wheezing  CV: no obvious cyanosis  MS: moves all visible extremities without noticeable abnormality  PSYCH/NEURO: pleasant and cooperative, no obvious depression or anxiety, speech and thought processing grossly intact  ASSESSMENT AND PLAN:  Discussed the following assessment and plan:  COVID infection.  Onset of symptoms yesterday.  Discussed antiviral therapy.  Given his current medical therapies we discussed possible  treatment with molnupiravir 4 capsules by mouth twice daily for 5 days.  Plenty fluids and rest.  Tylenol as needed for body aches.  Follow-up for any persistent or worsening symptoms.  Discussed isolation precautions.     I discussed the assessment and treatment plan with the patient. The patient was provided an opportunity to ask questions and all were answered. The patient agreed with the plan and demonstrated an understanding of the instructions.   The patient was advised to call back or seek an in-person evaluation if the symptoms worsen or if the condition fails to improve as anticipated.     Evelena Peat, MD

## 2022-12-25 DIAGNOSIS — M5030 Other cervical disc degeneration, unspecified cervical region: Secondary | ICD-10-CM | POA: Diagnosis not present

## 2022-12-25 DIAGNOSIS — M47812 Spondylosis without myelopathy or radiculopathy, cervical region: Secondary | ICD-10-CM | POA: Diagnosis not present

## 2022-12-29 DIAGNOSIS — H8111 Benign paroxysmal vertigo, right ear: Secondary | ICD-10-CM | POA: Diagnosis not present

## 2022-12-29 DIAGNOSIS — M542 Cervicalgia: Secondary | ICD-10-CM | POA: Diagnosis not present

## 2022-12-29 DIAGNOSIS — R293 Abnormal posture: Secondary | ICD-10-CM | POA: Diagnosis not present

## 2023-01-05 DIAGNOSIS — M542 Cervicalgia: Secondary | ICD-10-CM | POA: Diagnosis not present

## 2023-01-05 DIAGNOSIS — R293 Abnormal posture: Secondary | ICD-10-CM | POA: Diagnosis not present

## 2023-01-05 DIAGNOSIS — H8111 Benign paroxysmal vertigo, right ear: Secondary | ICD-10-CM | POA: Diagnosis not present

## 2023-01-11 DIAGNOSIS — M542 Cervicalgia: Secondary | ICD-10-CM | POA: Diagnosis not present

## 2023-01-11 DIAGNOSIS — H8111 Benign paroxysmal vertigo, right ear: Secondary | ICD-10-CM | POA: Diagnosis not present

## 2023-01-11 DIAGNOSIS — R293 Abnormal posture: Secondary | ICD-10-CM | POA: Diagnosis not present

## 2023-01-19 DIAGNOSIS — H8111 Benign paroxysmal vertigo, right ear: Secondary | ICD-10-CM | POA: Diagnosis not present

## 2023-01-19 DIAGNOSIS — R293 Abnormal posture: Secondary | ICD-10-CM | POA: Diagnosis not present

## 2023-01-19 DIAGNOSIS — M542 Cervicalgia: Secondary | ICD-10-CM | POA: Diagnosis not present

## 2023-01-26 DIAGNOSIS — R293 Abnormal posture: Secondary | ICD-10-CM | POA: Diagnosis not present

## 2023-01-26 DIAGNOSIS — M542 Cervicalgia: Secondary | ICD-10-CM | POA: Diagnosis not present

## 2023-01-26 DIAGNOSIS — H8111 Benign paroxysmal vertigo, right ear: Secondary | ICD-10-CM | POA: Diagnosis not present

## 2023-02-02 DIAGNOSIS — R293 Abnormal posture: Secondary | ICD-10-CM | POA: Diagnosis not present

## 2023-02-02 DIAGNOSIS — H8111 Benign paroxysmal vertigo, right ear: Secondary | ICD-10-CM | POA: Diagnosis not present

## 2023-02-02 DIAGNOSIS — M542 Cervicalgia: Secondary | ICD-10-CM | POA: Diagnosis not present

## 2023-02-09 DIAGNOSIS — R293 Abnormal posture: Secondary | ICD-10-CM | POA: Diagnosis not present

## 2023-02-09 DIAGNOSIS — M542 Cervicalgia: Secondary | ICD-10-CM | POA: Diagnosis not present

## 2023-02-09 DIAGNOSIS — H8111 Benign paroxysmal vertigo, right ear: Secondary | ICD-10-CM | POA: Diagnosis not present

## 2023-02-12 ENCOUNTER — Ambulatory Visit: Payer: PPO | Admitting: Physician Assistant

## 2023-02-16 DIAGNOSIS — H8111 Benign paroxysmal vertigo, right ear: Secondary | ICD-10-CM | POA: Diagnosis not present

## 2023-02-16 DIAGNOSIS — M542 Cervicalgia: Secondary | ICD-10-CM | POA: Diagnosis not present

## 2023-02-16 DIAGNOSIS — R293 Abnormal posture: Secondary | ICD-10-CM | POA: Diagnosis not present

## 2023-03-09 DIAGNOSIS — M542 Cervicalgia: Secondary | ICD-10-CM | POA: Diagnosis not present

## 2023-03-09 DIAGNOSIS — H8111 Benign paroxysmal vertigo, right ear: Secondary | ICD-10-CM | POA: Diagnosis not present

## 2023-03-09 DIAGNOSIS — R293 Abnormal posture: Secondary | ICD-10-CM | POA: Diagnosis not present

## 2023-03-21 DIAGNOSIS — R293 Abnormal posture: Secondary | ICD-10-CM | POA: Diagnosis not present

## 2023-03-21 DIAGNOSIS — M542 Cervicalgia: Secondary | ICD-10-CM | POA: Diagnosis not present

## 2023-03-21 DIAGNOSIS — H8111 Benign paroxysmal vertigo, right ear: Secondary | ICD-10-CM | POA: Diagnosis not present

## 2023-03-28 DIAGNOSIS — M542 Cervicalgia: Secondary | ICD-10-CM | POA: Diagnosis not present

## 2023-03-28 DIAGNOSIS — R293 Abnormal posture: Secondary | ICD-10-CM | POA: Diagnosis not present

## 2023-03-28 DIAGNOSIS — H8111 Benign paroxysmal vertigo, right ear: Secondary | ICD-10-CM | POA: Diagnosis not present

## 2023-04-04 DIAGNOSIS — H8111 Benign paroxysmal vertigo, right ear: Secondary | ICD-10-CM | POA: Diagnosis not present

## 2023-04-04 DIAGNOSIS — M542 Cervicalgia: Secondary | ICD-10-CM | POA: Diagnosis not present

## 2023-04-04 DIAGNOSIS — R293 Abnormal posture: Secondary | ICD-10-CM | POA: Diagnosis not present

## 2023-04-11 DIAGNOSIS — R293 Abnormal posture: Secondary | ICD-10-CM | POA: Diagnosis not present

## 2023-04-11 DIAGNOSIS — H8111 Benign paroxysmal vertigo, right ear: Secondary | ICD-10-CM | POA: Diagnosis not present

## 2023-04-11 DIAGNOSIS — M542 Cervicalgia: Secondary | ICD-10-CM | POA: Diagnosis not present

## 2023-04-18 DIAGNOSIS — H8111 Benign paroxysmal vertigo, right ear: Secondary | ICD-10-CM | POA: Diagnosis not present

## 2023-04-18 DIAGNOSIS — R293 Abnormal posture: Secondary | ICD-10-CM | POA: Diagnosis not present

## 2023-04-18 DIAGNOSIS — M542 Cervicalgia: Secondary | ICD-10-CM | POA: Diagnosis not present

## 2023-04-25 DIAGNOSIS — M542 Cervicalgia: Secondary | ICD-10-CM | POA: Diagnosis not present

## 2023-04-25 DIAGNOSIS — H8111 Benign paroxysmal vertigo, right ear: Secondary | ICD-10-CM | POA: Diagnosis not present

## 2023-04-25 DIAGNOSIS — R293 Abnormal posture: Secondary | ICD-10-CM | POA: Diagnosis not present

## 2023-04-26 ENCOUNTER — Encounter: Payer: Self-pay | Admitting: Internal Medicine

## 2023-04-26 ENCOUNTER — Ambulatory Visit: Payer: PPO | Admitting: Internal Medicine

## 2023-04-26 VITALS — BP 122/80 | HR 63 | Temp 98.4°F | Ht 74.5 in | Wt 241.0 lb

## 2023-04-26 DIAGNOSIS — J019 Acute sinusitis, unspecified: Secondary | ICD-10-CM | POA: Diagnosis not present

## 2023-04-26 MED ORDER — DOXYCYCLINE HYCLATE 100 MG PO TABS: 100.0000 mg | ORAL_TABLET | Freq: Two times a day (BID) | ORAL | 0 refills | Status: DC

## 2023-04-26 NOTE — Assessment & Plan Note (Signed)
Persistent illness for 3 weeks Likely secondary bacterial sinus infection Okay tylenol, robitussin DM Doxy 100 bid x 7 days

## 2023-04-26 NOTE — Progress Notes (Signed)
Subjective:    Patient ID: David Mcgee, male    DOB: May 08, 1946, 77 y.o.   MRN: [id number]  HPI Here due to respiratory infection  Started about 3 weeks ago Lots of coughing---would gag on the mucus Ongoing cough---not much production Head congestion---and eyes are glued shut in AM Has post nasal drip  No fever, sweats--but has felt cold No headache No ear pain No real SOB--or any change  Tried robitussin--guafenisin  Tylenol  Current Outpatient Medications on File Prior to Visit  Medication Sig Dispense Refill   amLODipine (NORVASC) 10 MG tablet TAKE 1 TABLET BY MOUTH EVERYDAY AT BEDTIME 90 tablet 0   ammonium lactate (AMLACTIN) 12 % cream Apply 1 g topically as needed (itchy skin).     aspirin EC 81 MG tablet Take 81 mg by mouth daily.     atorvastatin (LIPITOR) 10 MG tablet Take 10 mg by mouth daily.     Carboxymethylcellulose Sodium (THERATEARS OP) Apply 1 drop to eye daily as needed (dry eyes).     cholecalciferol (VITAMIN D3) 25 MCG (1000 UT) tablet Take 1,000 Units by mouth daily.     docusate sodium (COLACE) 100 MG capsule Take 200 mg by mouth 2 (two) times daily as needed for mild constipation.     hydrocortisone (ANUSOL-HC) 2.5 % rectal cream Place 1 application. rectally daily as needed for hemorrhoids or anal itching.     lisinopril (PRINIVIL,ZESTRIL) 5 MG tablet Take 2.5 mg by mouth daily.     Melatonin 5 MG TABS Take 5 mg by mouth at bedtime as needed.     Multiple Vitamin (MULTIVITAMIN) tablet Take 1 tablet by mouth daily.     Omega-3 Fatty Acids (FISH OIL) 1000 MG CAPS Take 1,000 mg by mouth 2 (two) times daily.      omeprazole (PRILOSEC) 20 MG capsule Take 1 capsule (20 mg total) by mouth daily.     sertraline (ZOLOFT) 100 MG tablet Take 100 mg by mouth daily.     tamsulosin (FLOMAX) 0.4 MG CAPS capsule Take 0.4 mg by mouth at bedtime.      No current facility-administered medications on file prior to visit.    Allergies  Allergen Reactions   Nsaids  Other (See Comments)    Avoid due to kidney concern   Augmentin [Amoxicillin-Pot Clavulanate] Diarrhea    Tolerates plain amoxicillin ok   Terazosin    Azithromycin Nausea Only   Trazodone And Nefazodone     Abnormal dreams.     Past Medical History:  Diagnosis Date   Arthritis    back, knees, hands - OA   Cancer (HCC)    Skin, not melanoma   Chronic kidney disease    loss of kidney function - L - since 1990's, h/o of renal calculi. 45% total function on right   Depression    ? panic attacks, takes sertraline & also sees psych q 3 months    GERD (gastroesophageal reflux disease)    H/O hiatal hernia    History of stress test    wnl   Hypertension    Insomnia    Palpitations    PTSD (post-traumatic stress disorder)    post [place]   Spinal cord cysts     Past Surgical History:  Procedure Laterality Date   BACK SURGERY     ESOPHAGOGASTRODUODENOSCOPY  06/1995   Dilitation bx negative (Dr. Sharen Hint)   Hernoiorraphy     Right; years ago   JOINT REPLACEMENT Bilateral  KNEE ARTHROPLASTY Right 08/18/2015   Procedure: COMPUTER ASSISTED TOTAL KNEE ARTHROPLASTY;  Surgeon: Donato Heinz, MD;  Location: [facility];  Service: Orthopedics;  Laterality: Right;   KNEE ARTHROSCOPY  10/2000   Right Knee (Dr. Hyacinth Meeker)   LUMBAR LAMINECTOMY/DECOMPRESSION MICRODISCECTOMY Right 12/09/2012   Procedure: LUMBAR LAMINECTOMY/DECOMPRESSION MICRODISCECTOMY 1 LEVEL;  Surgeon: Mariam Dollar, MD;  Location: [facility];  Service: Neurosurgery;  Laterality: Right;  LUMBAR LAMINECTOMY/DECOMPRESSION MICRODISCECTOMY 1 LEVEL   MOHS SURGERY  01/26/2017   nose   NEUROMA SURGERY Right 2000   Right foot Morton's neuroma excision   SHOULDER ARTHROSCOPY WITH SUBACROMIAL DECOMPRESSION AND BICEP TENDON REPAIR Left 07/04/2018   Procedure: SHOULDER ARTHROSCOPY WITH DEBRIDEMENT, DECOMPRESSION, REPAIR OF PARTIAL THICKNESS ROTATOR CUFF TEAR, BICEP TENODESIS WITH POSSIBLE DISTAL CLAVICLE EXCISION;  Surgeon: Christena Flake,  MD;  Location: [facility];  Service: Orthopedics;  Laterality: Left;   TONSILLECTOMY     TOTAL KNEE ARTHROPLASTY Bilateral 2013, 2016    Family History  Problem Relation Age of Onset   Hypertension Mother    Diabetes Mother    Kidney failure Father    Hypertension Father    Obesity Sister    Diabetes Sister    Diabetes Brother    Hypertension Brother    Hyperlipidemia Brother    Stroke Brother    Depression Brother        ?   Colon cancer Neg Hx    Prostate cancer Neg Hx     Social History   Socioeconomic History   Marital status: Married    Spouse name: joyce   Number of children: Not on file   Years of education: Not on file   Highest education level: Not on file  Occupational History   Occupation: Retired     Associate Professor: [employer]   Occupation: deacon at [employer]  Tobacco Use   Smoking status: Former    Current packs/day: 0.00    Average packs/day: 1.5 packs/day for 9.0 years (13.5 ttl pk-yrs)    Types: Cigarettes    Start date: 05/22/1960    Quit date: 05/22/1969    Years since quitting: 53.9   Smokeless tobacco: Never   Tobacco comments:    quit in 1972  Vaping Use   Vaping status: Never Used  Substance and Sexual Activity   Alcohol use: No   Drug use: No   Sexual activity: Yes  Other Topics Concern   Not on file  Social History Narrative   Married (second marriage 1989) and lives with wife   One child, 4 grandkids (2 step and 2 biological).   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  Agent orange exposure.   Retired from [employer], Public relations account executive   Social Determinants of Corporate investment banker Strain: Low Risk  (05/08/2022)   Overall Financial Resource Strain (CARDIA)    Difficulty of Paying Living Expenses: Not hard at all  Food Insecurity: No Food Insecurity (05/08/2022)   Hunger Vital Sign    Worried About Running Out of Food in the Last Year: Never true    Ran Out of Food in the Last Year: Never true  Transportation Needs: No Transportation Needs  (05/08/2022)   PRAPARE - Administrator, Civil Service (Medical): No    Lack of Transportation (Non-Medical): No  Physical Activity: Sufficiently Active (05/08/2022)   Exercise Vital Sign    Days of Exercise per Week: 3 days    Minutes of Exercise per Session: 50 min  Stress:  No Stress Concern Present (05/08/2022)   [employer] of Occupational Health - Occupational Stress Questionnaire    Feeling of Stress : Not at all  Social Connections: Socially Integrated (05/04/2021)   Social Connection and Isolation Panel [NHANES]    Frequency of Communication with Friends and Family: More than three times a week    Frequency of Social Gatherings with Friends and Family: More than three times a week    Attends Religious Services: More than 4 times per year    Active Member of [employer] or Organizations: Yes    Attends Engineer, structural: More than 4 times per year    Marital Status: Married  Catering manager Violence: Not At Risk (05/04/2021)   Humiliation, Afraid, Rape, and Kick questionnaire    Fear of Current or Ex-Partner: No    Emotionally Abused: No    Physically Abused: No    Sexually Abused: No   Review of Systems Smell is now better---down due to past COVID Didn't test for COVID Slight nausea--no vomiting Able to eat fine     Objective:   Physical Exam Constitutional:      Appearance: Normal appearance.  HENT:     Head:     Comments: No sinus tenderness    Mouth/Throat:     Pharynx: No oropharyngeal exudate or posterior oropharyngeal erythema.  Eyes:     Conjunctiva/sclera: Conjunctivae normal.  Pulmonary:     Effort: Pulmonary effort is normal.     Breath sounds: Normal breath sounds. No wheezing or rales.  Musculoskeletal:     Cervical back: Neck supple.  Lymphadenopathy:     Cervical: No cervical adenopathy.  Neurological:     Mental Status: He is alert.            Assessment & Plan:

## 2023-05-02 DIAGNOSIS — R293 Abnormal posture: Secondary | ICD-10-CM | POA: Diagnosis not present

## 2023-05-02 DIAGNOSIS — H8111 Benign paroxysmal vertigo, right ear: Secondary | ICD-10-CM | POA: Diagnosis not present

## 2023-05-02 DIAGNOSIS — M542 Cervicalgia: Secondary | ICD-10-CM | POA: Diagnosis not present

## 2023-05-09 DIAGNOSIS — M542 Cervicalgia: Secondary | ICD-10-CM | POA: Diagnosis not present

## 2023-05-09 DIAGNOSIS — H8111 Benign paroxysmal vertigo, right ear: Secondary | ICD-10-CM | POA: Diagnosis not present

## 2023-05-09 DIAGNOSIS — R293 Abnormal posture: Secondary | ICD-10-CM | POA: Diagnosis not present

## 2023-05-25 DIAGNOSIS — H8111 Benign paroxysmal vertigo, right ear: Secondary | ICD-10-CM | POA: Diagnosis not present

## 2023-05-25 DIAGNOSIS — M542 Cervicalgia: Secondary | ICD-10-CM | POA: Diagnosis not present

## 2023-05-25 DIAGNOSIS — R293 Abnormal posture: Secondary | ICD-10-CM | POA: Diagnosis not present

## 2023-06-01 DIAGNOSIS — M542 Cervicalgia: Secondary | ICD-10-CM | POA: Diagnosis not present

## 2023-06-01 DIAGNOSIS — R293 Abnormal posture: Secondary | ICD-10-CM | POA: Diagnosis not present

## 2023-06-01 DIAGNOSIS — H8111 Benign paroxysmal vertigo, right ear: Secondary | ICD-10-CM | POA: Diagnosis not present

## 2023-06-08 DIAGNOSIS — R293 Abnormal posture: Secondary | ICD-10-CM | POA: Diagnosis not present

## 2023-06-08 DIAGNOSIS — H8111 Benign paroxysmal vertigo, right ear: Secondary | ICD-10-CM | POA: Diagnosis not present

## 2023-06-08 DIAGNOSIS — M542 Cervicalgia: Secondary | ICD-10-CM | POA: Diagnosis not present

## 2023-06-15 DIAGNOSIS — M542 Cervicalgia: Secondary | ICD-10-CM | POA: Diagnosis not present

## 2023-06-15 DIAGNOSIS — R293 Abnormal posture: Secondary | ICD-10-CM | POA: Diagnosis not present

## 2023-06-15 DIAGNOSIS — H8111 Benign paroxysmal vertigo, right ear: Secondary | ICD-10-CM | POA: Diagnosis not present

## 2023-06-22 DIAGNOSIS — H8111 Benign paroxysmal vertigo, right ear: Secondary | ICD-10-CM | POA: Diagnosis not present

## 2023-06-22 DIAGNOSIS — R293 Abnormal posture: Secondary | ICD-10-CM | POA: Diagnosis not present

## 2023-06-22 DIAGNOSIS — M542 Cervicalgia: Secondary | ICD-10-CM | POA: Diagnosis not present

## 2023-06-29 DIAGNOSIS — M542 Cervicalgia: Secondary | ICD-10-CM | POA: Diagnosis not present

## 2023-06-29 DIAGNOSIS — H8111 Benign paroxysmal vertigo, right ear: Secondary | ICD-10-CM | POA: Diagnosis not present

## 2023-06-29 DIAGNOSIS — R293 Abnormal posture: Secondary | ICD-10-CM | POA: Diagnosis not present

## 2023-07-06 DIAGNOSIS — H8111 Benign paroxysmal vertigo, right ear: Secondary | ICD-10-CM | POA: Diagnosis not present

## 2023-07-06 DIAGNOSIS — R293 Abnormal posture: Secondary | ICD-10-CM | POA: Diagnosis not present

## 2023-07-06 DIAGNOSIS — M542 Cervicalgia: Secondary | ICD-10-CM | POA: Diagnosis not present

## 2023-07-11 DIAGNOSIS — M542 Cervicalgia: Secondary | ICD-10-CM | POA: Diagnosis not present

## 2023-07-11 DIAGNOSIS — H8111 Benign paroxysmal vertigo, right ear: Secondary | ICD-10-CM | POA: Diagnosis not present

## 2023-07-11 DIAGNOSIS — R293 Abnormal posture: Secondary | ICD-10-CM | POA: Diagnosis not present

## 2023-07-18 DIAGNOSIS — M542 Cervicalgia: Secondary | ICD-10-CM | POA: Diagnosis not present

## 2023-07-18 DIAGNOSIS — R293 Abnormal posture: Secondary | ICD-10-CM | POA: Diagnosis not present

## 2023-07-18 DIAGNOSIS — H8111 Benign paroxysmal vertigo, right ear: Secondary | ICD-10-CM | POA: Diagnosis not present

## 2023-07-25 DIAGNOSIS — M542 Cervicalgia: Secondary | ICD-10-CM | POA: Diagnosis not present

## 2023-07-25 DIAGNOSIS — H8111 Benign paroxysmal vertigo, right ear: Secondary | ICD-10-CM | POA: Diagnosis not present

## 2023-07-25 DIAGNOSIS — R293 Abnormal posture: Secondary | ICD-10-CM | POA: Diagnosis not present

## 2023-08-01 DIAGNOSIS — H8111 Benign paroxysmal vertigo, right ear: Secondary | ICD-10-CM | POA: Diagnosis not present

## 2023-08-01 DIAGNOSIS — R293 Abnormal posture: Secondary | ICD-10-CM | POA: Diagnosis not present

## 2023-08-01 DIAGNOSIS — M542 Cervicalgia: Secondary | ICD-10-CM | POA: Diagnosis not present

## 2023-08-07 ENCOUNTER — Encounter: Payer: Self-pay | Admitting: Dermatology

## 2023-08-07 ENCOUNTER — Ambulatory Visit (INDEPENDENT_AMBULATORY_CARE_PROVIDER_SITE_OTHER): Payer: PPO | Admitting: Dermatology

## 2023-08-07 VITALS — BP 130/70 | HR 70

## 2023-08-07 DIAGNOSIS — L814 Other melanin hyperpigmentation: Secondary | ICD-10-CM | POA: Diagnosis not present

## 2023-08-07 DIAGNOSIS — L821 Other seborrheic keratosis: Secondary | ICD-10-CM

## 2023-08-07 DIAGNOSIS — L578 Other skin changes due to chronic exposure to nonionizing radiation: Secondary | ICD-10-CM | POA: Diagnosis not present

## 2023-08-07 DIAGNOSIS — D1801 Hemangioma of skin and subcutaneous tissue: Secondary | ICD-10-CM

## 2023-08-07 DIAGNOSIS — D229 Melanocytic nevi, unspecified: Secondary | ICD-10-CM

## 2023-08-07 DIAGNOSIS — W908XXA Exposure to other nonionizing radiation, initial encounter: Secondary | ICD-10-CM | POA: Diagnosis not present

## 2023-08-07 DIAGNOSIS — Z1283 Encounter for screening for malignant neoplasm of skin: Secondary | ICD-10-CM | POA: Diagnosis not present

## 2023-08-07 DIAGNOSIS — L570 Actinic keratosis: Secondary | ICD-10-CM

## 2023-08-07 DIAGNOSIS — L853 Xerosis cutis: Secondary | ICD-10-CM

## 2023-08-07 DIAGNOSIS — Z7189 Other specified counseling: Secondary | ICD-10-CM

## 2023-08-07 DIAGNOSIS — Z85828 Personal history of other malignant neoplasm of skin: Secondary | ICD-10-CM

## 2023-08-07 MED ORDER — TRIAMCINOLONE ACETONIDE 0.1 % EX OINT: 1.0000 | TOPICAL_OINTMENT | Freq: Every day | CUTANEOUS | 1 refills | Status: DC | PRN

## 2023-08-07 NOTE — Progress Notes (Signed)
 New Patient Visit   Subjective  David Mcgee is a 78 y.o. male who presents for the following: Skin Cancer Screening and Full Body Skin Exam  The patient presents for Total-Body Skin Exam (TBSE) for skin cancer screening and mole check. The patient has spots, moles and lesions to be evaluated, some may be new or changing.  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   The following portions of the chart were reviewed this encounter and updated as appropriate: medications, allergies, medical history  Review of Systems:  No other skin or systemic complaints except as noted in HPI or Assessment and Plan.  Objective  Well appearing patient in no apparent distress; mood and affect are within normal limits.  A full examination was performed including scalp, head, eyes, ears, nose, lips, neck, chest, axillae, abdomen, back, buttocks, bilateral upper extremities, bilateral lower extremities, hands, feet, fingers, toes, fingernails, and toenails. All findings within normal limits unless otherwise noted below.   Relevant physical exam findings are noted in the Assessment and Plan.    Assessment & Plan   SKIN CANCER SCREENING PERFORMED TODAY.  ACTINIC DAMAGE - Chronic condition, secondary to cumulative UV/sun exposure - diffuse scaly erythematous macules with underlying dyspigmentation - Recommend daily broad spectrum sunscreen SPF 30+ to sun-exposed areas, reapply every 2 hours as needed.  - Staying in the shade or wearing long sleeves, sun glasses (UVA+UVB protection) and wide brim hats (4-inch brim around the entire circumference of the hat) are also recommended for sun protection.  - Call for new or changing lesions.  MELANOCYTIC NEVI - Tan-brown and/or pink-flesh-colored symmetric macules and papules - Benign appearing on exam today - Observation - Call clinic for new or changing moles - Recommend daily use of broad  spectrum spf 30+ sunscreen to sun-exposed areas.   HEMANGIOMA Exam: red papule(s) Discussed benign nature. Recommend observation. Call for changes.   LENTIGINES Exam: scattered tan macules Due to sun exposure Treatment Plan: Benign-appearing, observe. Recommend daily broad spectrum sunscreen SPF 30+ to sun-exposed areas, reapply every 2 hours as needed.  Call for any changes   SEBORRHEIC KERATOSIS - Stuck-on, waxy, tan-brown papules and/or plaques  - Benign-appearing - Discussed benign etiology and prognosis. - Observe - Call for any changes  ACTINIC KERATOSIS Exam: Erythematous thin papules/macules with gritty scale at the nose and ears  Actinic keratoses are precancerous spots that appear secondary to cumulative UV radiation exposure/sun exposure over time. They are chronic with expected duration over 1 year. A portion of actinic keratoses will progress to squamous cell carcinoma of the skin. It is not possible to reliably predict which spots will progress to skin cancer and so treatment is recommended to prevent development of skin cancer.  Recommend daily broad spectrum sunscreen SPF 30+ to sun-exposed areas, reapply every 2 hours as needed.  Recommend staying in the shade or wearing long sleeves, sun glasses (UVA+UVB protection) and wide brim hats (4-inch brim around the entire circumference of the hat). Call for new or changing lesions.  Treatment Plan: Start 5-fluorouracil cream twice a day for 14 days to affected areas including nose and ears.  Reviewed course of treatment and expected reaction.  Patient advised to expect inflammation and crusting and advised that erosions are possible.  Patient advised to be diligent with sun protection during and after treatment. Handout with details of how to apply medication and what to  expect provided. Counseled to keep medication out of reach of children and pets.  Reviewed course of treatment and expected reaction.  Patient advised to  expect inflammation and crusting and advised that erosions are possible.  Patient advised to be diligent with sun protection during and after treatment. Handout with details of how to apply medication and what to expect provided. Counseled to keep medication out of reach of children and pets.  AK (ACTINIC KERATOSIS) (10) left dorsal x 5, right dorsal x 4, right scalp x 1 (10) Destruction of lesion - left dorsal x 5, right dorsal x 4, right scalp x 1 (10) Complexity: simple   Destruction method: cryotherapy   Informed consent: discussed and consent obtained   Timeout:  patient name, date of birth, surgical site, and procedure verified Lesion destroyed using liquid nitrogen: Yes   Outcome: patient tolerated procedure well with no complications   Post-procedure details: wound care instructions given   Will treat ears and nose with efudex bid for 2 weeks  Xerosis - diffuse xerotic patches - recommend gentle, hydrating skin care - gentle skin care handout given - Triamcinolone 0.1% ointment BID to lower extremities  Return in about 2 months (around 10/07/2023) for AK f/u.  I, Tillie Fantasia, CMA, am acting as scribe for Gwenith Daily, MD.   Documentation: I have reviewed the above documentation for accuracy and completeness, and I agree with the above.  Gwenith Daily, MD

## 2023-08-07 NOTE — Patient Instructions (Addendum)
 Use Efudex twice a day for 2weeks to both ears and nose  Skin Education : We counseled the patient regarding the following: Sun screen (SPF 30 or greater) should be applied during peak UV exposure (between 10am and 2pm) and reapplied after exercise or swimming.  The ABCDEs of melanoma were reviewed with the patient, and the importance of monthly self-examination of moles was emphasized. Should any moles change in shape or color, or itch, bleed or burn, pt will contact our office for evaluation sooner then their interval appointment.  Plan: Sunscreen Recommendations We recommended a broad spectrum sunscreen with a SPF of 30 or higher. SPF 30 sunscreens block approximately 97 percent of the sun's harmful rays. Sunscreens should be applied at least 15 minutes prior to expected sun exposure and then every 2 hours after that as long as sun exposure continues. If swimming or exercising sunscreen should be reapplied every 45 minutes to an hour after getting wet or sweating. One ounce, or the equivalent of a shot glass full of sunscreen, is adequate to protect the skin not covered by a bathing suit. We also recommended a lip balm with a sunscreen as well. Sun protective clothing can be used in lieu of sunscreen but must be worn the entire time you are exposed to the sun's rays.   Important Information   Due to recent changes in healthcare laws, you may see results of your pathology and/or laboratory studies on MyChart before the doctors have had a chance to review them. We understand that in some cases there may be results that are confusing or concerning to you. Please understand that not all results are received at the same time and often the doctors may need to interpret multiple results in order to provide you with the best plan of care or course of treatment. Therefore, we ask that you please give [place] 2 business days to thoroughly review all your results before contacting the office for clarification.  Should we see a critical lab result, you will be contacted sooner.     If You Need Anything After Your Visit   If you have any questions or concerns for your doctor, please call our main line at [phone]. If no one answers, please leave a voicemail as directed and we will return your call as soon as possible. Messages left after 4 pm will be answered the following business day.    You may also send [place] a message via MyChart. We typically respond to MyChart messages within 1-2 business days.  For prescription refills, please ask your pharmacy to contact our office. Our fax number is [phone].  If you have an urgent issue when the clinic is closed that cannot wait until the next business day, you can page your doctor at the number below.     Please note that while we do our best to be available for urgent issues outside of office hours, we are not available 24/7.    If you have an urgent issue and are unable to reach [place], you may choose to seek medical care at your doctor's office, retail clinic, urgent care center, or emergency room.   If you have a medical emergency, please immediately call 911 or go to the emergency department. In the event of inclement weather, please call our main line at [phone] for an update on the status of any delays or closures.  Dermatology Medication Tips: Please keep the boxes that topical medications come in in order to help keep  track of the instructions about where and how to use these. Pharmacies typically print the medication instructions only on the boxes and not directly on the medication tubes.   If your medication is too expensive, please contact our office at [phone] or send [place] a message through MyChart.    We are unable to tell what your co-pay for medications will be in advance as this is different depending on your insurance coverage. However, we may be able to find a substitute medication at lower cost or fill out paperwork to get  insurance to cover a needed medication.    If a prior authorization is required to get your medication covered by your insurance company, please allow [place] 1-2 business days to complete this process.   Drug prices often vary depending on where the prescription is filled and some pharmacies may offer cheaper prices.   The website www.goodrx.com contains coupons for medications through different pharmacies. The prices here do not account for what the cost may be with help from insurance (it may be cheaper with your insurance), but the website can give you the price if you did not use any insurance.  - You can print the associated coupon and take it with your prescription to the pharmacy.  - You may also stop by our office during regular business hours and pick up a GoodRx coupon card.  - If you need your prescription sent electronically to a different pharmacy, notify our office through [facility] or by phone at [phone]

## 2023-08-08 DIAGNOSIS — R293 Abnormal posture: Secondary | ICD-10-CM | POA: Diagnosis not present

## 2023-08-08 DIAGNOSIS — H8111 Benign paroxysmal vertigo, right ear: Secondary | ICD-10-CM | POA: Diagnosis not present

## 2023-08-08 DIAGNOSIS — M542 Cervicalgia: Secondary | ICD-10-CM | POA: Diagnosis not present

## 2023-08-13 ENCOUNTER — Encounter: Payer: Self-pay | Admitting: Dermatology

## 2023-08-14 ENCOUNTER — Ambulatory Visit (INDEPENDENT_AMBULATORY_CARE_PROVIDER_SITE_OTHER): Admitting: Dermatology

## 2023-08-14 ENCOUNTER — Encounter: Payer: Self-pay | Admitting: Dermatology

## 2023-08-14 VITALS — BP 134/73 | HR 62 | Temp 98.4°F

## 2023-08-14 DIAGNOSIS — L579 Skin changes due to chronic exposure to nonionizing radiation, unspecified: Secondary | ICD-10-CM

## 2023-08-14 DIAGNOSIS — L570 Actinic keratosis: Secondary | ICD-10-CM

## 2023-08-14 DIAGNOSIS — W908XXA Exposure to other nonionizing radiation, initial encounter: Secondary | ICD-10-CM

## 2023-08-14 DIAGNOSIS — C44319 Basal cell carcinoma of skin of other parts of face: Secondary | ICD-10-CM

## 2023-08-14 DIAGNOSIS — C4491 Basal cell carcinoma of skin, unspecified: Secondary | ICD-10-CM

## 2023-08-14 DIAGNOSIS — L814 Other melanin hyperpigmentation: Secondary | ICD-10-CM

## 2023-08-14 MED ORDER — TRIAMCINOLONE ACETONIDE 0.025 % EX OINT: 1.0000 | TOPICAL_OINTMENT | Freq: Two times a day (BID) | CUTANEOUS | 1 refills | Status: AC

## 2023-08-14 MED ORDER — HYDROCORTISONE 2.5 % EX OINT: TOPICAL_OINTMENT | Freq: Two times a day (BID) | CUTANEOUS | 0 refills | Status: AC

## 2023-08-14 MED ORDER — OXYCODONE HCL 5 MG PO TABS
5.0000 mg | ORAL_TABLET | Freq: Four times a day (QID) | ORAL | 0 refills | Status: DC | PRN
Start: 2023-08-14 — End: 2023-09-20

## 2023-08-14 NOTE — Patient Instructions (Signed)

## 2023-08-14 NOTE — Progress Notes (Signed)
 Follow-Up Visit   Subjective  David Mcgee is a 78 y.o. 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 Alvina Filbert at [facility].   He is also following up on his current use of topical 5FU for his actinic damage on his face. He states that the treatment is very uncomfortable. He has been using it for 5 days.   The following portions of the chart were reviewed this encounter and updated as appropriate: medications, allergies, medical history  Review of Systems:  No other skin or systemic complaints except as noted in HPI or Assessment and Plan.  Objective  Well appearing patient in no apparent distress; mood and affect are within normal limits.  A focused examination was performed of the following areas:  Chin face Relevant physical exam findings are noted in the Assessment and Plan.   Chin Healing biopsy site   Assessment & Plan   BASAL CELL CARCINOMA (BCC), UNSPECIFIED SITE Chin Mohs surgery  Consent obtained: written  Anticoagulation: Was the anticoagulation regimen changed prior to Mohs? No    Procedure Details: Timeout: pre-procedure verification complete Procedure Prep: patient was prepped and draped in usual sterile fashion Prep type: chlorhexidine Pre-Op diagnosis: basal cell carcinoma BCC subtype: infiltrative and nodular MohsAIQ Surgical site (if tumor spans multiple areas, please select predominant area): chin Surgical site (from skin exam): Chin Pre-operative length (cm): 0.6 Pre-operative width (cm): 0.9 Indications for Mohs surgery: anatomic location where tissue conservation is critical  Micrographic Surgery Details: Post-operative length (cm): 1.2 Post-operative width (cm): 1.8 Number of Mohs stages: 1  Skin repair Complexity:  Complex Final length (cm):  3.8 Informed consent: discussed and consent obtained   Timeout: patient name, date of birth, surgical site, and procedure  verified   Procedure prep:  Patient was prepped and draped in usual sterile fashion Prep type:  Chlorhexidine Anesthesia: the lesion was anesthetized in a standard fashion   Anesthetic:  1% lidocaine w/ epinephrine 1-100,000 buffered w/ 8.4% NaHCO3 Reason for type of repair: reduce tension to allow closure, avoid adjacent structures, allow side-to-side closure without requiring a flap or graft and compensate for the inelasticity of skin in this area   Subcutaneous layers (deep stitches):  Suture size:  5-0 Suture type: Monocryl (poliglecaprone 25)   Stitches:  Buried vertical mattress Fine/surface layer approximation (top stitches):  Suture size:  6-0 Suture type: fast-absorbing plain gut   Stitches: simple running   Hemostasis achieved with: suture, pressure and electrodesiccation Outcome: patient tolerated procedure well with no complications   Post-procedure details: sterile dressing applied and wound care instructions given   Dressing type: bandage and pressure dressing   Related Medications oxyCODONE (OXY IR/ROXICODONE) 5 MG immediate release tablet Take 1 tablet (5 mg total) by mouth every 6 (six) hours as needed for up to 8 doses for severe pain (pain score 7-10).  ACTINIC KERATOSIS Exam: Erythematous thin papules/macules with gritty scale on the whole face  Actinic keratoses are precancerous spots that appear secondary to cumulative UV radiation exposure/sun exposure over time. They are chronic with expected duration over 1 year. A portion of actinic keratoses will progress to squamous cell carcinoma of the skin. It is not possible to reliably predict which spots will progress to skin cancer and so treatment is recommended to prevent development of skin cancer.  Recommend daily broad spectrum sunscreen SPF 30+ to sun-exposed areas, reapply every 2 hours as needed.  Recommend staying in the  shade or wearing long sleeves, sun glasses (UVA+UVB protection) and wide brim hats (4-inch  brim around the entire circumference of the hat). Call for new or changing lesions.  Treatment Plan: Continue 5-fluorouracil cream twice a day for 14 days total to affected areas including face and nose.  Reviewed course of treatment and expected reaction.  Patient advised to expect inflammation and crusting and advised that erosions are possible.  Patient advised to be diligent with sun protection during and after treatment. Handout with details of how to apply medication and what to expect provided. Counseled to keep medication out of reach of children and pets.  Reviewed course of treatment and expected reaction.  Patient advised to expect inflammation and crusting and advised that erosions are possible.  Patient advised to be diligent with sun protection during and after treatment. Handout with details of how to apply medication and what to expect provided. Counseled to keep medication out of reach of children and pets.  Will send hydrocortisone 2.5 % ointment to help mitigate discomfort from treatment.   Return in about 4 weeks (around 09/11/2023) for wound check.  Owens Shark, CMA, am acting as scribe for Gwenith Daily, MD.   08/14/2023  HISTORY OF PRESENT ILLNESS  David Mcgee is seen in consultation at the request of Dr. Alvina Filbert for biopsy-proven Nodular/Infiltrative Basal Cell Carcinoma of the right chin. They note that the area has been present for about 6 months increasing in size with time.  There is no history of previous treatment.  Reports no other new or changing lesions and has no other complaints today.  Medications and allergies: see patient chart.  Review of systems: Reviewed 8 systems and notable for the above skin cancer.  All other systems reviewed are unremarkable/negative, unless noted in the HPI. Past medical history, surgical history, family history, social history were also reviewed and are noted in the chart/questionnaire.    PHYSICAL EXAMINATION  General:  Well-appearing, in no acute distress, alert and oriented x 4. Vitals reviewed in chart (if available).   Skin: Exam reveals a 0.6 x 0.9 cm erythematous papule and biopsy scar on the right chin. There are rhytids, telangiectasias, and lentigines, consistent with photodamage.  Biopsy report(s) reviewed, confirming the diagnosis.  ASSESSMENT  1) Infiltrative/Nodular Basal Cell Carcinoma of the right chin 2) photodamage 3) solar lentigines   PLAN   1. Due to location, size, histology, or recurrence and the likelihood of subclinical extension as well as the need to conserve normal surrounding tissue, the patient was deemed acceptable for Mohs micrographic surgery (MMS).  The nature and purpose of the procedure, associated benefits and risks including recurrence and scarring, possible complications such as pain, infection, and bleeding, and alternative methods of treatment if appropriate were discussed with the patient during consent. The lesion location was verified by the patient, by reviewing previous notes, pathology reports, and by photographs as well as angulation measurements if available.  Informed consent was reviewed and signed by the patient, and timeout was performed at 9:00 AM. See op note below.  2. For the photodamage and solar lentigines, sun protection discussed/information given on OTC sunscreens, and we recommend continued regular follow-up with primary dermatologist every 6 months or sooner for any growing, bleeding, or changing lesions. 3. Prognosis and future surveillance discussed. 4. Letter with treatment outcome sent to referring provider. 5. Pain acetaminophen/oxycodone 5 mg  MOHS MICROGRAPHIC SURGERY AND RECONSTRUCTION  Initial size:   0.6 x 0.9 cm Surgical defect/wound size:  1.2 x 1.8 cm Anesthesia:    0.33% lidocaine with 1:200,000 epinephrine EBL:    <5 mL Complications:  None Repair type:   Complex SQ suture:   5-0 Monocryl Cutaneous suture:  6-0 Plain gut Final  size of the repair: 3.8 cm  Stages: 1  STAGE I: Anesthesia achieved with 0.5% lidocaine with 1:200,000 epinephrine. ChloraPrep applied. 2 section(s) excised using Mohs technique (this includes total peripheral and deep tissue margin excision and evaluation with frozen sections, excised and interpreted by the same physician). The tumor was first debulked and then excised with an approx. 2mm margin.  Hemostasis was achieved with electrocautery as needed.  The specimen was then oriented, subdivided/relaxed, inked, and processed using Mohs technique.    Frozen section analysis revealed a clear deep and peripheral margin.  Reconstruction  The surgical wound was then cleaned, prepped, and re-anesthetized as above. Wound edges were undermined extensively along at least one entire edge and at a distance equal to or greater than the width of the defect (see wound defect size above) in order to achieve closure and decrease wound tension and anatomic distortion. Redundant tissue repair including standing cone removal was performed. Hemostasis was achieved with electrocautery. Subcutaneous and epidermal tissues were approximated with the above sutures. The surgical site was then lightly scrubbed with sterile, saline-soaked gauze. The area was then bandaged using Vaseline ointment, non-adherent gauze, gauze pads, and tape to provide an adequate pressure dressing. The patient tolerated the procedure well, was given detailed written and verbal wound care instructions, and was discharged in good condition.   The patient will follow-up: 4 weeks.  Documentation: I have reviewed the above documentation for accuracy and completeness, and I agree with the above.  Gwenith Daily, MD

## 2023-08-16 ENCOUNTER — Encounter: Payer: Self-pay | Admitting: Dermatology

## 2023-08-17 DIAGNOSIS — M542 Cervicalgia: Secondary | ICD-10-CM | POA: Diagnosis not present

## 2023-08-17 DIAGNOSIS — H8111 Benign paroxysmal vertigo, right ear: Secondary | ICD-10-CM | POA: Diagnosis not present

## 2023-08-17 DIAGNOSIS — R293 Abnormal posture: Secondary | ICD-10-CM | POA: Diagnosis not present

## 2023-08-23 DIAGNOSIS — R293 Abnormal posture: Secondary | ICD-10-CM | POA: Diagnosis not present

## 2023-08-23 DIAGNOSIS — M542 Cervicalgia: Secondary | ICD-10-CM | POA: Diagnosis not present

## 2023-08-23 DIAGNOSIS — H8111 Benign paroxysmal vertigo, right ear: Secondary | ICD-10-CM | POA: Diagnosis not present

## 2023-08-31 DIAGNOSIS — M542 Cervicalgia: Secondary | ICD-10-CM | POA: Diagnosis not present

## 2023-08-31 DIAGNOSIS — R293 Abnormal posture: Secondary | ICD-10-CM | POA: Diagnosis not present

## 2023-08-31 DIAGNOSIS — H8111 Benign paroxysmal vertigo, right ear: Secondary | ICD-10-CM | POA: Diagnosis not present

## 2023-09-06 DIAGNOSIS — M542 Cervicalgia: Secondary | ICD-10-CM | POA: Diagnosis not present

## 2023-09-06 DIAGNOSIS — R293 Abnormal posture: Secondary | ICD-10-CM | POA: Diagnosis not present

## 2023-09-06 DIAGNOSIS — H8111 Benign paroxysmal vertigo, right ear: Secondary | ICD-10-CM | POA: Diagnosis not present

## 2023-09-11 ENCOUNTER — Encounter: Payer: Self-pay | Admitting: Dermatology

## 2023-09-11 ENCOUNTER — Ambulatory Visit (INDEPENDENT_AMBULATORY_CARE_PROVIDER_SITE_OTHER): Admitting: Dermatology

## 2023-09-11 VITALS — BP 162/81

## 2023-09-11 DIAGNOSIS — L570 Actinic keratosis: Secondary | ICD-10-CM

## 2023-09-11 DIAGNOSIS — Z85828 Personal history of other malignant neoplasm of skin: Secondary | ICD-10-CM

## 2023-09-11 DIAGNOSIS — W908XXA Exposure to other nonionizing radiation, initial encounter: Secondary | ICD-10-CM | POA: Diagnosis not present

## 2023-09-11 DIAGNOSIS — L905 Scar conditions and fibrosis of skin: Secondary | ICD-10-CM

## 2023-09-11 DIAGNOSIS — C4491 Basal cell carcinoma of skin, unspecified: Secondary | ICD-10-CM

## 2023-09-11 NOTE — Progress Notes (Signed)
 Follow-Up Visit   Subjective  David Mcgee is a 78 y.o. male who presents for the following: History of BCC of chin S/P Mohs 08/14/23, repaired with linear closure. He is also following up on Aks of face treated with 5FU - he finished about 1 week ago.  The following portions of the chart were reviewed this encounter and updated as appropriate: medications, allergies, medical history  Review of Systems:  No other skin or systemic complaints except as noted in HPI or Assessment and Plan.  Objective  Well appearing patient in no apparent distress; mood and affect are within normal limits.   A focused examination was performed of the following areas: Face Ears  Relevant exam findings are noted in the Assessment and Plan.    Left earlobe x 3, right earlobe x 1 (4) Erythematous thin papules/macules with gritty scale.   Assessment & Plan   Scar s/p Mohs for [facility] on right chin, treated on 08/14/2023, repaired with linear closure - Reassured that wound has healed well - Discussed that scars take up to 12 months to mature from the date of surgery - Recommend SPF 30+ to scar daily to prevent purple color - OK to start scar massage at 4-6 weeks post-op - Can consider silicone based products for scar healing  HISTORY OF BASAL CELL CARCINOMA OF THE SKIN - No evidence of recurrence today - Recommend regular full body skin exams - Recommend daily broad spectrum sunscreen SPF 30+ to sun-exposed areas, reapply every 2 hours as needed.  - Call if any new or changing lesions are noted between office visits  ACTINIC KERATOSIS Exam: Erythematous thin papules/macules with gritty scale, much improved on face s/p topical 5FU  Actinic keratoses are precancerous spots that appear secondary to cumulative UV radiation exposure/sun exposure over time. They are chronic with expected duration over 1 year. A portion of actinic keratoses will progress to squamous cell carcinoma of the skin. It is not  possible to reliably predict which spots will progress to skin cancer and so treatment is recommended to prevent development of skin cancer.  Recommend daily broad spectrum sunscreen SPF 30+ to sun-exposed areas, reapply every 2 hours as needed.  Recommend staying in the shade or wearing long sleeves, sun glasses (UVA+UVB protection) and wide brim hats (4-inch brim around the entire circumference of the hat). Call for new or changing lesions.  Treatment Plan:  Prior to procedure, discussed risks of blister formation, small wound, skin dyspigmentation, or rare scar following cryotherapy. Recommend Vaseline ointment to treated areas while healing.  Destruction Procedure Note Destruction method: cryotherapy   Informed consent: discussed and consent obtained   Lesion destroyed using liquid nitrogen: Yes   Outcome: patient tolerated procedure well with no complications   Post-procedure details: wound care instructions given   Locations: 4 # of Lesions Treated: bilateral earlobes  AK (ACTINIC KERATOSIS) (4) Left earlobe x 3, right earlobe x 1 (4) Destruction of lesion - Left earlobe x 3, right earlobe x 1 (4) Complexity: simple   Destruction method: cryotherapy   Informed consent: discussed and consent obtained   Timeout:  patient name, date of birth, surgical site, and procedure verified Lesion destroyed using liquid nitrogen: Yes   Region frozen until ice ball extended beyond lesion: Yes   Outcome: patient tolerated procedure well with no complications   Post-procedure details: wound care instructions given   BASAL CELL CARCINOMA (BCC), UNSPECIFIED SITE   Related Medications oxyCODONE  (OXY IR/ROXICODONE ) 5 MG immediate release  tablet Take 1 tablet (5 mg total) by mouth every 6 (six) hours as needed for up to 8 doses for severe pain (pain score 7-10). SCAR    Return in about 3 months (around 12/11/2023) for TBSE, History of BCC.   Documentation: I have reviewed the above  documentation for accuracy and completeness, and I agree with the above.  Deneise Finlay, MD

## 2023-09-11 NOTE — Patient Instructions (Signed)

## 2023-09-12 DIAGNOSIS — H8111 Benign paroxysmal vertigo, right ear: Secondary | ICD-10-CM | POA: Diagnosis not present

## 2023-09-12 DIAGNOSIS — R293 Abnormal posture: Secondary | ICD-10-CM | POA: Diagnosis not present

## 2023-09-12 DIAGNOSIS — M542 Cervicalgia: Secondary | ICD-10-CM | POA: Diagnosis not present

## 2023-09-20 ENCOUNTER — Encounter: Payer: Self-pay | Admitting: Family Medicine

## 2023-09-20 ENCOUNTER — Ambulatory Visit (INDEPENDENT_AMBULATORY_CARE_PROVIDER_SITE_OTHER): Admitting: Family Medicine

## 2023-09-20 VITALS — BP 134/78 | HR 63 | Temp 98.2°F | Ht 74.5 in | Wt 242.2 lb

## 2023-09-20 DIAGNOSIS — M722 Plantar fascial fibromatosis: Secondary | ICD-10-CM | POA: Diagnosis not present

## 2023-09-20 DIAGNOSIS — F4310 Post-traumatic stress disorder, unspecified: Secondary | ICD-10-CM

## 2023-09-20 NOTE — Patient Instructions (Signed)
 Likely plantar fasciitis.    Stretch your arch before getting out of bed.  Ice as needed.  Ask about seeing Dr. Geralyn Knee if not better.  Take care.  Glad to see you.

## 2023-09-20 NOTE — Progress Notes (Unsigned)
 Mood d/w pt. PTSD in general discussed with patient.  No suicidal homicidal intent.  Being around his grandkids clearly helps his mood.  He has psychiatric follow-up pending.  R heel sore, plantar and medial side.  R>L foot, symmetric exam but the right foot is more affected.  Pain with first step out of bed.  No trauma.Going on for about 3 months.  Pain at the heel.  Not in the toes.    Meds, vitals, and allergies reviewed.   ROS: Per HPI unless specifically indicated in ROS section   Nad Ncat Speech normal.  Affect normal. Mild lateral heel strike wear pattern on both shoes. Medial and lateral malleolar not tender on both feet.  Achilles nontender on both feet.  He is tender at the right greater than left plantar fascial origin.  He is able to bear weight.  Dorsal midfoot nontender.

## 2023-09-21 DIAGNOSIS — H8111 Benign paroxysmal vertigo, right ear: Secondary | ICD-10-CM | POA: Diagnosis not present

## 2023-09-21 DIAGNOSIS — R293 Abnormal posture: Secondary | ICD-10-CM | POA: Diagnosis not present

## 2023-09-21 DIAGNOSIS — M542 Cervicalgia: Secondary | ICD-10-CM | POA: Diagnosis not present

## 2023-09-23 DIAGNOSIS — M722 Plantar fascial fibromatosis: Secondary | ICD-10-CM | POA: Insufficient documentation

## 2023-09-23 NOTE — Assessment & Plan Note (Signed)
 Likely plantar fasciitis.   Anatomy discussed with patient.  Handout given.  Discussed with patient about home stretching/icing. Stretch arch before getting out of bed.  Ice as needed.  Ask about seeing Dr. Geralyn Knee if not better.

## 2023-09-23 NOTE — Assessment & Plan Note (Signed)
 See above.  He is going to follow-up with psychiatry.  Okay for outpatient follow-up.

## 2023-09-28 DIAGNOSIS — R293 Abnormal posture: Secondary | ICD-10-CM | POA: Diagnosis not present

## 2023-09-28 DIAGNOSIS — H8111 Benign paroxysmal vertigo, right ear: Secondary | ICD-10-CM | POA: Diagnosis not present

## 2023-09-28 DIAGNOSIS — M542 Cervicalgia: Secondary | ICD-10-CM | POA: Diagnosis not present

## 2023-10-01 DIAGNOSIS — R262 Difficulty in walking, not elsewhere classified: Secondary | ICD-10-CM | POA: Diagnosis not present

## 2023-10-01 DIAGNOSIS — M722 Plantar fascial fibromatosis: Secondary | ICD-10-CM | POA: Diagnosis not present

## 2023-10-01 DIAGNOSIS — M79671 Pain in right foot: Secondary | ICD-10-CM | POA: Diagnosis not present

## 2023-10-08 ENCOUNTER — Ambulatory Visit: Admitting: Dermatology

## 2023-10-09 DIAGNOSIS — M722 Plantar fascial fibromatosis: Secondary | ICD-10-CM | POA: Diagnosis not present

## 2023-10-09 DIAGNOSIS — M79671 Pain in right foot: Secondary | ICD-10-CM | POA: Diagnosis not present

## 2023-10-09 DIAGNOSIS — R262 Difficulty in walking, not elsewhere classified: Secondary | ICD-10-CM | POA: Diagnosis not present

## 2023-10-17 ENCOUNTER — Ambulatory Visit (INDEPENDENT_AMBULATORY_CARE_PROVIDER_SITE_OTHER): Admitting: Physician Assistant

## 2023-10-17 ENCOUNTER — Ambulatory Visit: Admitting: Dermatology

## 2023-10-17 ENCOUNTER — Encounter: Payer: Self-pay | Admitting: Physician Assistant

## 2023-10-17 VITALS — BP 104/60 | HR 65

## 2023-10-17 DIAGNOSIS — Z85828 Personal history of other malignant neoplasm of skin: Secondary | ICD-10-CM | POA: Diagnosis not present

## 2023-10-17 DIAGNOSIS — Z4889 Encounter for other specified surgical aftercare: Secondary | ICD-10-CM | POA: Diagnosis not present

## 2023-10-17 DIAGNOSIS — T148XXA Other injury of unspecified body region, initial encounter: Secondary | ICD-10-CM

## 2023-10-17 NOTE — Progress Notes (Signed)
   Follow-Up Visit   Subjective  David Mcgee is a 77 y.o. male who presents for the following: wound check Pt has spot on chin that he had Mohs surgery on 08/14/23. He states that he keeps "catching it" and it starts to bleed. Did admit to me that he is often times picking at it.    The following portions of the chart were reviewed this encounter and updated as appropriate: medications, allergies, medical history  Review of Systems:  No other skin or systemic complaints except as noted in HPI or Assessment and Plan.  Objective  Well appearing patient in no apparent distress; mood and affect are within normal limits.  A focused examination was performed of the following areas: chin  Relevant exam findings are noted in the Assessment and Plan.    Assessment & Plan   HISTORY OF BASAL CELL CARCINOMA OF THE SKIN right chin - Excoriation (see photo)  - No evidence of recurrence today - AVOID PICKING  - Recommend regular full body skin exams - Recommend daily broad spectrum sunscreen SPF 30+ to sun-exposed areas, reapply every 2 hours as needed.  - Call if any new or changing lesions are noted between office visits  - He has a follow scheduled in October.     Return if symptoms worsen or fail to improve.  I, Wilson Hasten, CMA, am acting as scribe for [employer], PA-C.   Documentation: I have reviewed the above documentation for accuracy and completeness, and I agree with the above.  Benigna Delisi K, PA-C

## 2023-10-17 NOTE — Patient Instructions (Signed)

## 2023-10-19 DIAGNOSIS — R262 Difficulty in walking, not elsewhere classified: Secondary | ICD-10-CM | POA: Diagnosis not present

## 2023-10-19 DIAGNOSIS — M722 Plantar fascial fibromatosis: Secondary | ICD-10-CM | POA: Diagnosis not present

## 2023-10-19 DIAGNOSIS — M79671 Pain in right foot: Secondary | ICD-10-CM | POA: Diagnosis not present

## 2023-10-24 ENCOUNTER — Encounter: Payer: Self-pay | Admitting: Dermatology

## 2023-10-24 DIAGNOSIS — H8111 Benign paroxysmal vertigo, right ear: Secondary | ICD-10-CM | POA: Diagnosis not present

## 2023-10-24 DIAGNOSIS — R293 Abnormal posture: Secondary | ICD-10-CM | POA: Diagnosis not present

## 2023-10-24 DIAGNOSIS — M542 Cervicalgia: Secondary | ICD-10-CM | POA: Diagnosis not present

## 2023-10-31 DIAGNOSIS — R293 Abnormal posture: Secondary | ICD-10-CM | POA: Diagnosis not present

## 2023-10-31 DIAGNOSIS — M542 Cervicalgia: Secondary | ICD-10-CM | POA: Diagnosis not present

## 2023-10-31 DIAGNOSIS — H8111 Benign paroxysmal vertigo, right ear: Secondary | ICD-10-CM | POA: Diagnosis not present

## 2023-11-08 DIAGNOSIS — H8111 Benign paroxysmal vertigo, right ear: Secondary | ICD-10-CM | POA: Diagnosis not present

## 2023-11-08 DIAGNOSIS — R293 Abnormal posture: Secondary | ICD-10-CM | POA: Diagnosis not present

## 2023-11-08 DIAGNOSIS — M542 Cervicalgia: Secondary | ICD-10-CM | POA: Diagnosis not present

## 2023-11-23 ENCOUNTER — Other Ambulatory Visit: Payer: Self-pay | Admitting: Medical Genetics

## 2023-11-26 ENCOUNTER — Other Ambulatory Visit
Admission: RE | Admit: 2023-11-26 | Discharge: 2023-11-26 | Disposition: A | Discharge status: Home / Self Care | Payer: Self-pay | Source: Ambulatory Visit | Attending: Medical Genetics | Admitting: Medical Genetics

## 2023-11-30 ENCOUNTER — Ambulatory Visit (INDEPENDENT_AMBULATORY_CARE_PROVIDER_SITE_OTHER)

## 2023-11-30 VITALS — BP 116/78 | Ht 74.5 in | Wt 243.4 lb

## 2023-11-30 DIAGNOSIS — Z0000 Encounter for general adult medical examination without abnormal findings: Secondary | ICD-10-CM

## 2023-11-30 NOTE — Progress Notes (Addendum)
 Please attest and cosign this visit due to patients primary care provider not being in the office at the time the visit was completed.    Subjective:   David Mcgee is a 78 y.o. who presents for a Medicare Wellness preventive visit.  As a reminder, Annual Wellness Visits don't include a physical exam, and some assessments may be limited, especially if this visit is performed virtually. We may recommend an in-person follow-up visit with your provider if needed.  Visit Complete: In person  Persons Participating in Visit: Patient.  AWV Questionnaire: Yes: Patient Medicare AWV questionnaire was completed by the patient on 11/28/23; I have confirmed that all information answered by patient is correct and no changes since this date.  Cardiac Risk Factors include: advanced age (>18men, >87 women);dyslipidemia;hypertension;male gender;obesity (BMI >30kg/m2)     Objective:    Today's Vitals   11/28/23 1356 11/30/23 1016  Weight:  243 lb 6.4 oz (110.4 kg)  Height:  6' 2.5 (1.892 m)  PainSc: 4     Body mass index is 30.83 kg/m.     11/30/2023   10:40 AM 05/08/2022    2:18 PM 05/04/2021    2:08 PM 04/30/2020    2:00 PM 05/13/2019    2:22 PM 05/13/2019    2:21 PM 07/04/2018    7:13 AM  Advanced Directives  Does Patient Have a Medical Advance Directive? Yes Yes Yes Yes No No Yes   Type of Estate agent of [place];Living will Healthcare Power of [place];Living will Healthcare Power of [place];Living will Healthcare Power of [place];Living will   Healthcare Power of [place];Living will  Does patient want to make changes to medical advance directive?   Yes (MAU/Ambulatory/Procedural Areas - Information given)    No - Patient declined   Copy of Healthcare Power of Attorney in Chart? No - copy requested No - copy requested  No - copy requested   No - copy requested   Would patient like information on creating a medical advance directive?     No - Patient declined No -  Patient declined      Data saved with a previous flowsheet row definition    Current Medications (verified) Outpatient Encounter Medications as of 11/30/2023  Medication Sig   amLODipine  (NORVASC ) 10 MG tablet TAKE 1 TABLET BY MOUTH EVERYDAY AT BEDTIME   ammonium lactate (AMLACTIN) 12 % cream Apply 1 g topically as needed (itchy skin).   aspirin  EC 81 MG tablet Take 81 mg by mouth daily.   atorvastatin  (LIPITOR) 10 MG tablet Take 10 mg by mouth daily.   Carboxymethylcellulose Sodium (THERATEARS OP) Apply 1 drop to eye daily as needed (dry eyes).   cholecalciferol (VITAMIN D3) 25 MCG (1000 UT) tablet Take 1,000 Units by mouth daily.   docusate sodium  (COLACE) 100 MG capsule Take 200 mg by mouth 2 (two) times daily as needed for mild constipation.   hydrocortisone  (ANUSOL -HC) 2.5 % rectal cream Place 1 application. rectally daily as needed for hemorrhoids or anal itching.   hydrocortisone  2.5 % ointment Apply topically 2 (two) times daily.   lisinopril  (PRINIVIL ,ZESTRIL ) 5 MG tablet Take 2.5 mg by mouth daily.   Melatonin 5 MG TABS Take 5 mg by mouth at bedtime as needed.   Multiple Vitamin (MULTIVITAMIN) tablet Take 1 tablet by mouth daily.   Omega-3 Fatty Acids (FISH OIL) 1000 MG CAPS Take 1,000 mg by mouth 2 (two) times daily.    omeprazole  (PRILOSEC) 20 MG capsule Take 1  capsule (20 mg total) by mouth daily.   sertraline  (ZOLOFT ) 100 MG tablet Take 100 mg by mouth daily.   tamsulosin  (FLOMAX ) 0.4 MG CAPS capsule Take 0.4 mg by mouth at bedtime.    triamcinolone  (KENALOG ) 0.025 % ointment Apply 1 Application topically 2 (two) times daily.   triamcinolone  ointment (KENALOG ) 0.1 % Apply 1 Application topically daily as needed.   No facility-administered encounter medications on file as of 11/30/2023.    Allergies (verified) Nsaids, Augmentin  [amoxicillin -pot clavulanate], Terazosin, Azithromycin, and Trazodone  and nefazodone   History: Past Medical History:  Diagnosis Date    Arthritis    back, knees, hands - OA   Cancer (HCC)    Skin, not melanoma   Cataract    Chronic kidney disease    loss of kidney function - L - since 1990's, h/o of renal calculi. 45% total function on right   Depression    ? panic attacks, takes sertraline  & also sees psych q 3 months    GERD (gastroesophageal reflux disease)    H/O hiatal hernia    History of stress test    wnl   Hypertension    Insomnia    Palpitations    PTSD (post-traumatic stress disorder)    post [place]   Spinal cord cysts    Past Surgical History:  Procedure Laterality Date   BACK SURGERY     COSMETIC SURGERY     ESOPHAGOGASTRODUODENOSCOPY  06/1995   Dilitation bx negative (Dr. Neita)   HERNIA REPAIR     Hernoiorraphy     Right; years ago   JOINT REPLACEMENT Bilateral    KNEE ARTHROPLASTY Right 08/18/2015   Procedure: COMPUTER ASSISTED TOTAL KNEE ARTHROPLASTY;  Surgeon: Lynwood SHAUNNA Hue, MD;  Location: [facility];  Service: Orthopedics;  Laterality: Right;   KNEE ARTHROSCOPY  10/2000   Right Knee (Dr. Cleotilde)   LUMBAR LAMINECTOMY/DECOMPRESSION MICRODISCECTOMY Right 12/09/2012   Procedure: LUMBAR LAMINECTOMY/DECOMPRESSION MICRODISCECTOMY 1 LEVEL;  Surgeon: Arley SHAUNNA Helling, MD;  Location: [facility];  Service: Neurosurgery;  Laterality: Right;  LUMBAR LAMINECTOMY/DECOMPRESSION MICRODISCECTOMY 1 LEVEL   MOHS SURGERY  01/26/2017   nose   NEUROMA SURGERY Right 2000   Right foot Morton's neuroma excision   SHOULDER ARTHROSCOPY WITH SUBACROMIAL DECOMPRESSION AND BICEP TENDON REPAIR Left 07/04/2018   Procedure: SHOULDER ARTHROSCOPY WITH DEBRIDEMENT, DECOMPRESSION, REPAIR OF PARTIAL THICKNESS ROTATOR CUFF TEAR, BICEP TENODESIS WITH POSSIBLE DISTAL CLAVICLE EXCISION;  Surgeon: Edie Norleen PARAS, MD;  Location: [facility];  Service: Orthopedics;  Laterality: Left;   TONSILLECTOMY     TOTAL KNEE ARTHROPLASTY Bilateral 2013, 2016   Family History  Problem Relation Age of Onset   Hypertension Mother    Diabetes Mother     Kidney failure Father    Hypertension Father    Obesity Sister    Diabetes Sister    Diabetes Brother    Hypertension Brother    Hyperlipidemia Brother    Stroke Brother    Depression Brother        ?   Colon cancer Neg Hx    Prostate cancer Neg Hx    Social History   Socioeconomic History   Marital status: Married    Spouse name: joyce   Number of children: Not on file   Years of education: Not on file   Highest education level: 12th grade  Occupational History   Occupation: Retired     Associate Professor: [employer]   Occupation: deacon at [employer]  Tobacco Use   Smoking  status: Former    Current packs/day: 0.00    Average packs/day: 1.5 packs/day for 9.0 years (13.5 ttl pk-yrs)    Types: Cigarettes    Start date: 05/22/1960    Quit date: 05/22/1969    Years since quitting: 54.5   Smokeless tobacco: Never   Tobacco comments:    quit in 1972  Vaping Use   Vaping status: Never Used  Substance and Sexual Activity   Alcohol use: No   Drug use: No   Sexual activity: Yes    Birth control/protection: None  Other Topics Concern   Not on file  Social History Narrative   Married (second marriage 1989) and lives with wife   One child, 4 grandkids (2 step and 2 biological).   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  Agent orange exposure.   Retired from [employer], Public relations account executive   Social Drivers of [employer]: Low Risk  (11/28/2023)   Overall Financial Resource Strain (CARDIA)    Difficulty of Paying Living Expenses: Not hard at all  Food Insecurity: No Food Insecurity (11/28/2023)   Hunger Vital Sign    Worried About Running Out of Food in the Last Year: Never true    Ran Out of Food in the Last Year: Never true  Transportation Needs: No Transportation Needs (11/28/2023)   PRAPARE - Administrator, Civil Service (Medical): No    Lack of Transportation (Non-Medical): No  Physical Activity: Insufficiently Active (11/28/2023)   Exercise Vital Sign    Days of  Exercise per Week: 3 days    Minutes of Exercise per Session: 40 min  Stress: Stress Concern Present (11/28/2023)   [employer] of Occupational Health - Occupational Stress Questionnaire    Feeling of Stress: To some extent  Social Connections: Socially Integrated (11/28/2023)   Social Connection and Isolation Panel    Frequency of Communication with Friends and Family: More than three times a week    Frequency of Social Gatherings with Friends and Family: Twice a week    Attends Religious Services: More than 4 times per year    Active Member of [employer] or Organizations: Yes    Attends Engineer, structural: More than 4 times per year    Marital Status: Married    Tobacco Counseling Counseling given: Not Answered Tobacco comments: quit in 1972    Clinical Intake:  Pre-visit preparation completed: Yes  Pain : 0-10 Pain Score: 4  Pain Type: Chronic pain Pain Location: Neck (lft toe and rt ankle also) Pain Orientation: Right Pain Descriptors / Indicators: Aching Pain Onset: More than a month ago Pain Frequency: Constant Pain Relieving Factors: tylenol ,heat,stretches, PT Effect of Pain on Daily Activities: walk and doing less  Pain Relieving Factors: tylenol ,heat,stretches, PT  BMI - recorded: 30.83 Nutritional Status: BMI > 30  Obese Nutritional Risks: None Diabetes: No  Lab Results  Component Value Date   HGBA1C 6.0 12/07/2022   HGBA1C 5.7 09/22/2021   HGBA1C 6.0 02/18/2020     How often do you need to have someone help you when you read instructions, pamphlets, or other written materials from your doctor or pharmacy?: 1 - Never  Interpreter Needed?: No  Comments: lives with wife Information entered by :: B.Adoni Greenough,LPN   Activities of Daily Living     11/28/2023    1:56 PM  In your present state of health, do you have any difficulty performing the following activities:  Hearing? 0  Vision? 1  Difficulty  concentrating or making decisions? 0   Walking or climbing stairs? 0  Dressing or bathing? 0  Doing errands, shopping? 0  Preparing Food and eating ? N  Using the Toilet? N  In the past six months, have you accidently leaked urine? N  Do you have problems with loss of bowel control? N  Managing your Medications? N  Managing your Finances? N  Housekeeping or managing your Housekeeping? N    Patient Care Team: Cleatus Arlyss RAMAN, MD as PCP - General (Family Medicine) Bluford Dorn Holts, MD as Referring Physician (Dermatology) Cathlyn Seal, MD as Referring Physician (Dermatology) Brynn Mclean, MD as Referring Physician (Internal Medicine) Sharrie Prentice PARAS, DO as Consulting Physician (Sports Medicine) [facility], [facility]  I have updated your Care Teams any recent Medical Services you may have received from other providers in the past year.     Assessment:   This is a routine wellness examination for [place].  Hearing/Vision screen Hearing Screening - Comments:: Pt says his hearing is good with hearing aids Vision Screening - Comments:: Pt says his vision is good w/glasses [facility]   Goals Addressed             This Visit's Progress    Patient Stated       11/30/23- I will continue to go to the gym 3 days a week for 1 hour.      Patient Stated   On track    11/30/23-Would like to drink more water     Patient Stated   Not on track    11/30/23- wants to lose weight       Depression Screen     11/30/2023   10:30 AM 09/20/2023    4:07 PM 12/11/2022    3:24 PM 05/08/2022    2:19 PM 10/26/2021   12:37 PM 05/04/2021    2:12 PM 04/30/2020    2:02 PM  PHQ 2/9 Scores  PHQ - 2 Score 2 2 2 1  0 0 4  PHQ- 9 Score 4 12 10    6     Fall Risk     11/28/2023    1:56 PM 09/20/2023    4:07 PM 12/11/2022    3:23 PM 05/08/2022    2:19 PM 10/26/2021   12:37 PM  Fall Risk   Falls in the past year? 0 0 0 0 0  Number falls in past yr: 0 0 0 0 0  Injury with Fall? 0 0 0 0 0  Risk for fall due to  : No Fall Risks No Fall Risks No Fall Risks Medication side effect   Follow up Education provided;Falls prevention discussed Falls evaluation completed Falls evaluation completed Falls prevention discussed;Education provided;Falls evaluation completed       Data saved with a previous flowsheet row definition    MEDICARE RISK AT HOME:  Medicare Risk at Home Any stairs in or around the home?: (Patient-Rptd) Yes If so, are there any without handrails?: (Patient-Rptd) No Home free of loose throw rugs in walkways, pet beds, electrical cords, etc?: (Patient-Rptd) Yes Adequate lighting in your home to reduce risk of falls?: (Patient-Rptd) Yes Life alert?: (Patient-Rptd) No Use of a cane, walker or w/c?: (Patient-Rptd) No Grab bars in the bathroom?: (Patient-Rptd) Yes Shower chair or bench in shower?: (Patient-Rptd) Yes Elevated toilet seat or a handicapped toilet?: (Patient-Rptd) Yes  TIMED UP AND GO:  Was the test performed?  Yes  Length of time to ambulate 10 feet:  13 sec Gait slow and steady without use of assistive device  Cognitive Function: 6CIT completed    04/30/2020    2:09 PM 03/29/2018   11:07 AM 03/21/2017   10:28 AM 12/01/2015    8:38 AM  MMSE - Mini Mental State Exam  Orientation to time 5 5 5  5    Orientation to Place 5 5 5  5    Registration 3 3 3  3    Attention/ Calculation 5 0 0  0   Recall 3 3 3  3    Language- name 2 objects  0 0  0   Language- repeat 1 1 1 1   Language- follow 3 step command  3 3  3    Language- read & follow direction  0 0  0   Write a sentence  0 0  0   Copy design  0 0  0   Total score  20 20  20       Data saved with a previous flowsheet row definition        11/30/2023   10:42 AM 05/08/2022    2:22 PM  6CIT Screen  What Year? 0 points 0 points  What month? 0 points 0 points  What time? 0 points 0 points  Count back from 20 0 points 0 points  Months in reverse 0 points 0 points  Repeat phrase 0 points 2 points  Total Score 0 points  2 points    Immunizations Immunization History  Administered Date(s) Administered   Fluad Quad(high Dose 65+) 03/03/2022   Influenza, High Dose Seasonal PF 02/13/2014, 01/10/2019, 03/22/2023   Influenza,inj,quad, With Preservative 02/19/2018   Influenza-Unspecified 02/28/2017, 02/14/2018, 01/26/2020, 03/20/2021   Moderna Covid-19 Vaccine Bivalent Booster 23yrs & up 07/06/2021   PFIZER Comirnaty(Gray Top)Covid-19 Tri-Sucrose Vaccine 11/15/2020   PFIZER(Purple Top)SARS-COV-2 Vaccination 05/28/2019, 06/18/2019, 01/29/2020, 10/20/2020   Pfizer Covid-19 Vaccine Bivalent Booster 50yrs & up 05/06/2021   Pneumococcal Conjugate-13 10/01/2013   Pneumococcal Polysaccharide-23 05/22/2010, 09/22/2010, 01/24/2011   Rsv, Bivalent, Protein Subunit Rsvpref,pf [place]) 07/12/2022   Td 10/01/2008   Tdap 03/05/2017   Zoster Recombinant(Shingrix) 01/19/2020, 03/20/2020   Zoster, Live 05/22/2013    Screening Tests Health Maintenance  Topic Date Due   COVID-19 Vaccine (7 - 2024-25 season) 01/21/2023   INFLUENZA VACCINE  12/21/2023   Medicare Annual Wellness (AWV)  11/29/2024   DTaP/Tdap/Td (3 - Td or Tdap) 03/06/2027   Pneumococcal Vaccine: 50+ Years  Completed   Hepatitis C Screening  Completed   Zoster Vaccines- Shingrix  Completed   Hepatitis B Vaccines  Aged Out   HPV VACCINES  Aged Out   Meningococcal B Vaccine  Aged Out   Colonoscopy  Discontinued    Health Maintenance  Health Maintenance Due  Topic Date Due   COVID-19 Vaccine (7 - 2024-25 season) 01/21/2023   Health Maintenance Items Addressed: None needed at this time  Additional Screening:  Vision Screening: Recommended annual ophthalmology exams for early detection of glaucoma and other disorders of the eye. Would you like a referral to an eye doctor? No    Dental Screening: Recommended annual dental exams for proper oral hygiene  Community Resource Referral / Chronic Care Management: CRR required this visit?  No   CCM  required this visit?  No   Plan:    I have personally reviewed and noted the following in the patient's chart:   Medical and social history Use of alcohol, tobacco or illicit drugs  Current medications and supplements including opioid prescriptions.  Patient is not currently taking opioid prescriptions. Functional ability and status Nutritional status Physical activity Advanced directives List of other physicians Hospitalizations, surgeries, and ER visits in previous 12 months Vitals Screenings to include cognitive, depression, and falls Referrals and appointments  In addition, I have reviewed and discussed with patient certain preventive protocols, quality metrics, and best practice recommendations. A written personalized care plan for preventive services as well as general preventive health recommendations were provided to patient.   Erminio LITTIE Saris, LPN   2/88/7974   After Visit Summary: (MyChart) Due to this being a telephonic visit, the after visit summary with patients personalized plan was offered to patient via MyChart   Notes: Nothing significant to report at this time.

## 2023-11-30 NOTE — Patient Instructions (Signed)
 [name] , Thank you for taking time out of your busy schedule to complete your Annual Wellness Visit with me. I enjoyed our conversation and look forward to speaking with you again next year. I, as well as your care team,  appreciate your ongoing commitment to your health goals. Please review the following plan we discussed and let me know if I can assist you in the future. Your Game plan/ To Do List     Follow up Visits: Next Medicare AWV with our clinical staff: 12/02/24 @ 10:10am in person   Have you seen your provider in the last 6 months (3 months if uncontrolled diabetes)? No Next Office Visit with your provider: 12/07/23  Clinician Recommendations:  Aim for 30 minutes of exercise or brisk walking, 6-8 glasses of water, and 5 servings of fruits and vegetables each day.       This is a list of the screening recommended for you and due dates:  Health Maintenance  Topic Date Due   COVID-19 Vaccine (7 - 2024-25 season) 01/21/2023   Flu Shot  12/21/2023   Medicare Annual Wellness Visit  11/29/2024   DTaP/Tdap/Td vaccine (3 - Td or Tdap) 03/06/2027   Pneumococcal Vaccine for age over 81  Completed   Hepatitis C Screening  Completed   Zoster (Shingles) Vaccine  Completed   Hepatitis B Vaccine  Aged Out   HPV Vaccine  Aged Out   Meningitis B Vaccine  Aged Out   Colon Cancer Screening  Discontinued    Advanced directives: (Copy Requested) Please bring a copy of your health care power of attorney and living will to the office to be added to your chart at your convenience. You can mail to [facility] [address] [place], [place] [address] or email to [email or web address] Advance Care Planning is important because it:  [x]  Makes sure you receive the medical care that is consistent with your values, goals, and preferences  [x]  It provides guidance to your family and loved ones and reduces their decisional burden about whether or not they are making the right  decisions based on your wishes.  Follow the link provided in your after visit summary or read over the paperwork we have mailed to you to help you started getting your Advance Directives in place. If you need assistance in completing these, please reach out to us  so that we can help you!

## 2023-12-07 ENCOUNTER — Encounter: Payer: Self-pay | Admitting: Family Medicine

## 2023-12-07 ENCOUNTER — Ambulatory Visit: Admitting: Family Medicine

## 2023-12-07 ENCOUNTER — Ambulatory Visit: Payer: Self-pay | Admitting: Family Medicine

## 2023-12-07 ENCOUNTER — Ambulatory Visit (INDEPENDENT_AMBULATORY_CARE_PROVIDER_SITE_OTHER)
Admission: RE | Admit: 2023-12-07 | Discharge: 2023-12-07 | Disposition: A | Discharge status: Home / Self Care | Source: Ambulatory Visit | Attending: Family Medicine | Admitting: Family Medicine

## 2023-12-07 VITALS — BP 128/74 | HR 61 | Temp 98.0°F | Ht 74.5 in | Wt 241.2 lb

## 2023-12-07 DIAGNOSIS — R251 Tremor, unspecified: Secondary | ICD-10-CM

## 2023-12-07 DIAGNOSIS — R0602 Shortness of breath: Secondary | ICD-10-CM | POA: Diagnosis not present

## 2023-12-07 DIAGNOSIS — M542 Cervicalgia: Secondary | ICD-10-CM | POA: Diagnosis not present

## 2023-12-07 DIAGNOSIS — M25571 Pain in right ankle and joints of right foot: Secondary | ICD-10-CM | POA: Diagnosis not present

## 2023-12-07 DIAGNOSIS — R39198 Other difficulties with micturition: Secondary | ICD-10-CM

## 2023-12-07 DIAGNOSIS — L84 Corns and callosities: Secondary | ICD-10-CM | POA: Diagnosis not present

## 2023-12-07 DIAGNOSIS — R5383 Other fatigue: Secondary | ICD-10-CM | POA: Diagnosis not present

## 2023-12-07 DIAGNOSIS — H8111 Benign paroxysmal vertigo, right ear: Secondary | ICD-10-CM | POA: Diagnosis not present

## 2023-12-07 DIAGNOSIS — R293 Abnormal posture: Secondary | ICD-10-CM | POA: Diagnosis not present

## 2023-12-07 DIAGNOSIS — M7731 Calcaneal spur, right foot: Secondary | ICD-10-CM | POA: Diagnosis not present

## 2023-12-07 DIAGNOSIS — M19071 Primary osteoarthritis, right ankle and foot: Secondary | ICD-10-CM | POA: Diagnosis not present

## 2023-12-07 LAB — COMPREHENSIVE METABOLIC PANEL WITH GFR
ALT: 32 U/L (ref 0–53)
AST: 34 U/L (ref 0–37)
Albumin: 4.6 g/dL (ref 3.5–5.2)
Alkaline Phosphatase: 55 U/L (ref 39–117)
BUN: 15 mg/dL (ref 6–23)
CO2: 27 meq/L (ref 19–32)
Calcium: 9.6 mg/dL (ref 8.4–10.5)
Chloride: 102 meq/L (ref 96–112)
Creatinine, Ser: 1.5 mg/dL (ref 0.40–1.50)
GFR: 44.08 mL/min — ABNORMAL LOW (ref >60.00)
Glucose, Bld: 80 mg/dL (ref 70–99)
Potassium: 4.1 meq/L (ref 3.5–5.1)
Sodium: 139 meq/L (ref 135–145)
Total Bilirubin: 1 mg/dL (ref 0.2–1.2)
Total Protein: 7.6 g/dL (ref 6.0–8.3)

## 2023-12-07 LAB — CBC WITH DIFFERENTIAL/PLATELET
Basophils Absolute: 0 K/uL (ref 0.0–0.1)
Basophils Relative: 0.4 % (ref 0.0–3.0)
Eosinophils Absolute: 0.1 K/uL (ref 0.0–0.7)
Eosinophils Relative: 2.2 % (ref 0.0–5.0)
HCT: 48.8 % (ref 39.0–52.0)
Hemoglobin: 16.4 g/dL (ref 13.0–17.0)
Lymphocytes Relative: 36.8 % (ref 12.0–46.0)
Lymphs Abs: 2.5 K/uL (ref 0.7–4.0)
MCHC: 33.7 g/dL (ref 30.0–36.0)
MCV: 88.8 fl (ref 78.0–100.0)
Monocytes Absolute: 0.8 K/uL (ref 0.1–1.0)
Monocytes Relative: 11.7 % (ref 3.0–12.0)
Neutro Abs: 3.4 K/uL (ref 1.4–7.7)
Neutrophils Relative %: 48.9 % (ref 43.0–77.0)
Platelets: 156 K/uL (ref 150.0–400.0)
RBC: 5.49 Mil/uL (ref 4.22–5.81)
RDW: 13.5 % (ref 11.5–15.5)
WBC: 6.9 K/uL (ref 4.0–10.5)

## 2023-12-07 LAB — GENECONNECT MOLECULAR SCREEN: Genetic Analysis Overall Interpretation: NEGATIVE

## 2023-12-07 LAB — POC URINALSYSI DIPSTICK (AUTOMATED)
Bilirubin, UA: NEGATIVE
Blood, UA: NEGATIVE
Glucose, UA: NEGATIVE
Ketones, UA: NEGATIVE
Nitrite, UA: NEGATIVE
Protein, UA: NEGATIVE
Spec Grav, UA: 1.02 (ref 1.010–1.025)
Urobilinogen, UA: NEGATIVE U/dL — AB
pH, UA: 6 (ref 5.0–8.0)

## 2023-12-07 LAB — BRAIN NATRIURETIC PEPTIDE: Pro B Natriuretic peptide (BNP): 32 pg/mL (ref 0.0–100.0)

## 2023-12-07 LAB — TSH: TSH: 6.28 u[IU]/mL — ABNORMAL HIGH (ref 0.35–5.50)

## 2023-12-07 NOTE — Assessment & Plan Note (Signed)
 Of unclear cause, see notes on labs, check plain films.  I do not suspect fracture but he could have degenerative changes.

## 2023-12-07 NOTE — Assessment & Plan Note (Signed)
 He only has minimal irritation on the glans.  See notes on labs.

## 2023-12-07 NOTE — Assessment & Plan Note (Signed)
 Refer to podiatry.  My question is not just about shaving the callus down but also about helping him with inserts to prevent callus recurrence.

## 2023-12-07 NOTE — Assessment & Plan Note (Signed)
 Of unclear source but does not appear to be parkinsonian.  He has tremor on finger-nose testing but not at rest.  See notes on labs.  Differential discussed with patient.

## 2023-12-07 NOTE — Patient Instructions (Addendum)
 Let me know if you can't get set up with podiatry.   Go to the lab on the way out.   If you have mychart we'll likely use that to update you.    Take care.  Glad to see you.

## 2023-12-07 NOTE — Assessment & Plan Note (Signed)
 Of unclear source, see notes on labs.  Lungs are clear.  Heart is regular.  Benign exam today.  Not in distress.

## 2023-12-07 NOTE — Progress Notes (Signed)
 3 weeks of fatigue.  Muscle fatigue in the legs with walking.  No exertional cramping.  Had R calf cramp last night but that was not exertional.  Some occasional SOB with exertion.  No CP.  No FCNAVD.  Abnormal sensation with urination.  Some occ discharge.  Not circumcised.  Had used jock itch cream in the meantime, that helped with external irritation.   Some occ mild nausea.    R>L hand tremor.  Going on for about a month.  Gradually worse.  Not a resting tremor.  Noted when he is drinking from a cup.  He has gone to the gym and excerised at baseline w/o CP.  He can lay flat at night w/o SOB.    L 1st toe.  Small chronic crack in 1st toe callous.  Doesn't appear infected.  He has been dealing with a callus for about 1 year.  R ankle with episodic sharp pain.  Sore locally anterior to the medial malleolus and dorsum of ankle.  Going on for about 2-3 months.  Pain most days but not all day.  More pain barefoot.  Diclofenac didn't help.  Tylenol  didn't help.    Meds, vitals, and allergies reviewed.   ROS: Per HPI unless specifically indicated in ROS section   Nad Ncat Neck supple, no LA Rrr Ctab Abdomen soft, nontender. Skin well-perfused. L 1st toe.  Small chronic crack in 1st toe callous.  Doesn't appear infected.  No resting tremor.  He does have hand tremor noted with finger-to-nose testing. Normal external genitalia exam except for minimal irritation of the glans, uncircumcised. Right ankle without bruising or obvious swelling. Sore locally anterior to the medial malleolus and dorsum of ankle.   Medial and lateral malleolus are nontender, Achilles nontender, mortise stable.

## 2023-12-08 LAB — URINE CULTURE
MICRO NUMBER:: 16717672
SPECIMEN QUALITY:: ADEQUATE

## 2023-12-10 DIAGNOSIS — R293 Abnormal posture: Secondary | ICD-10-CM | POA: Diagnosis not present

## 2023-12-10 DIAGNOSIS — H8111 Benign paroxysmal vertigo, right ear: Secondary | ICD-10-CM | POA: Diagnosis not present

## 2023-12-10 DIAGNOSIS — M542 Cervicalgia: Secondary | ICD-10-CM | POA: Diagnosis not present

## 2023-12-18 ENCOUNTER — Ambulatory Visit: Payer: Self-pay | Admitting: Podiatry

## 2023-12-18 DIAGNOSIS — M19071 Primary osteoarthritis, right ankle and foot: Secondary | ICD-10-CM | POA: Diagnosis not present

## 2023-12-18 DIAGNOSIS — L84 Corns and callosities: Secondary | ICD-10-CM | POA: Diagnosis not present

## 2023-12-18 MED ORDER — BETAMETHASONE SOD PHOS & ACET 6 (3-3) MG/ML IJ SUSP
3.0000 mg | Freq: Once | INTRAMUSCULAR | Status: AC
  Administered 2023-12-18: 3 mg via INTRA_ARTICULAR

## 2023-12-18 NOTE — Progress Notes (Signed)
 Chief Complaint  Patient presents with   Callouses    Pt is here due to callous on the left great toe, he states it has been there for about a year, starting to become painful, pt is not  a diabetic.     HPI: 78 y.o. male presenting today for 2 separate complaints.  First the patient states that he has had a symptomatic callus to the plantar aspect of the left great toe for over 1 year now.  He does admit to walking around the house barefoot as well as outside the majority of the day.  No history of injury.  Also presenting for evaluation of right ankle pain.  X-rays were taken consistent with arthritis.  He has not done anything for treatment.  Past Medical History:  Diagnosis Date   Arthritis    back, knees, hands - OA   Cancer (HCC)    Skin, not melanoma   Cataract    Chronic kidney disease    loss of kidney function - L - since 1990's, h/o of renal calculi. 45% total function on right   Depression    ? panic attacks, takes sertraline  & also sees psych q 3 months    GERD (gastroesophageal reflux disease)    H/O hiatal hernia    History of stress test    wnl   Hypertension    Insomnia    Palpitations    PTSD (post-traumatic stress disorder)    post [place]   Spinal cord cysts     Past Surgical History:  Procedure Laterality Date   BACK SURGERY     COSMETIC SURGERY     ESOPHAGOGASTRODUODENOSCOPY  06/1995   Dilitation bx negative (Dr. Neita)   HERNIA REPAIR     Hernoiorraphy     Right; years ago   JOINT REPLACEMENT Bilateral    KNEE ARTHROPLASTY Right 08/18/2015   Procedure: COMPUTER ASSISTED TOTAL KNEE ARTHROPLASTY;  Surgeon: Lynwood SHAUNNA Hue, MD;  Location: [facility];  Service: Orthopedics;  Laterality: Right;   KNEE ARTHROSCOPY  10/2000   Right Knee (Dr. Cleotilde)   LUMBAR LAMINECTOMY/DECOMPRESSION MICRODISCECTOMY Right 12/09/2012   Procedure: LUMBAR LAMINECTOMY/DECOMPRESSION MICRODISCECTOMY 1 LEVEL;  Surgeon: Arley SHAUNNA Helling, MD;  Location: [facility];  Service:  Neurosurgery;  Laterality: Right;  LUMBAR LAMINECTOMY/DECOMPRESSION MICRODISCECTOMY 1 LEVEL   MOHS SURGERY  01/26/2017   nose   NEUROMA SURGERY Right 2000   Right foot Morton's neuroma excision   SHOULDER ARTHROSCOPY WITH SUBACROMIAL DECOMPRESSION AND BICEP TENDON REPAIR Left 07/04/2018   Procedure: SHOULDER ARTHROSCOPY WITH DEBRIDEMENT, DECOMPRESSION, REPAIR OF PARTIAL THICKNESS ROTATOR CUFF TEAR, BICEP TENODESIS WITH POSSIBLE DISTAL CLAVICLE EXCISION;  Surgeon: Edie Norleen PARAS, MD;  Location: [facility];  Service: Orthopedics;  Laterality: Left;   TONSILLECTOMY     TOTAL KNEE ARTHROPLASTY Bilateral 2013, 2016    Allergies  Allergen Reactions   Nsaids Other (See Comments)    Avoid due to kidney concern   Augmentin  [Amoxicillin -Pot Clavulanate] Diarrhea    Tolerates plain amoxicillin  ok   Terazosin    Azithromycin Nausea Only   Trazodone  And Nefazodone     Abnormal dreams.      Physical Exam: General: The patient is alert and oriented x3 in no acute distress.  Dermatology: Skin is warm, dry and supple bilateral lower extremities.  Hyperkeratotic callus lesion noted to the plantar aspect of the left great toe  Vascular: Palpable pedal pulses bilaterally. Capillary refill within normal limits.  No appreciable edema.  No erythema.  Neurological: Grossly intact via light touch  Musculoskeletal Exam: No pedal deformities noted.  Tenderness and pain with palpation to the anteromedial aspect of the tibiotalar joint right.  No crepitus with palpation or range of motion  DG Ankle Complete Right 12/08/2023 FINDINGS: No acute bony or joint abnormality is identified. No focal bone lesion. Mild tibiotalar osteoarthritis is noted. Tiny plantar calcaneal spur is seen. Soft tissues are negative.   IMPRESSION: 1. No acute finding. 2. Mild tibiotalar osteoarthritis. 3. Tiny plantar calcaneal spur.  Assessment/Plan of Care: 1.  Callus lesion plantar aspect of the left great toe -Patient  evaluated -Explained that the patient is likely having calluses because he walks barefoot the majority of the day on hardwood floors as well as outside on concrete.  The way he walks creates excessive friction and pressure to the left great toe joint.  No acute -Recommend refraining from going barefoot.  Recommend good supportive tennis shoes and sneakers at all times -Excisional debridement of the callus lesion was performed today using a 312 scalpel without incident or bleeding -Also recommend moleskin bandages to cushion the callus lesion of the toe as well  2.  Arthritis right ankle -Patient evaluated.  X-rays reviewed -Injection of 0.5 cc Celestone  Soluspan injected into the medial aspect of the tibiotalar joint right -Again recommend good supportive tennis shoes that support the foot and ankle -Return to clinic PRN       Thresa EMERSON Sar, DPM [facility]  Dr. Thresa EMERSON Sar, DPM    [address] [place], [place] [address]                Office [phone]  Fax [phone]

## 2023-12-19 DIAGNOSIS — H8111 Benign paroxysmal vertigo, right ear: Secondary | ICD-10-CM | POA: Diagnosis not present

## 2023-12-19 DIAGNOSIS — M542 Cervicalgia: Secondary | ICD-10-CM | POA: Diagnosis not present

## 2023-12-19 DIAGNOSIS — R293 Abnormal posture: Secondary | ICD-10-CM | POA: Diagnosis not present

## 2023-12-25 ENCOUNTER — Telehealth: Payer: Self-pay | Admitting: Family Medicine

## 2023-12-25 NOTE — Telephone Encounter (Signed)
 Spoke with patient about a spot on neck. He said that it has been looked at and they said that it was a fatty tissue. He believes that his wife is just overly concerned and he does not need to be seen. But if it becomes more of a problem he will definitely set up an appointment.

## 2023-12-25 NOTE — Telephone Encounter (Signed)
 Please check with patient about a spot on his neck- wife mentioned that today. Offer OV if persistent.  Thanks.

## 2023-12-26 DIAGNOSIS — R293 Abnormal posture: Secondary | ICD-10-CM | POA: Diagnosis not present

## 2023-12-26 DIAGNOSIS — H8111 Benign paroxysmal vertigo, right ear: Secondary | ICD-10-CM | POA: Diagnosis not present

## 2023-12-26 DIAGNOSIS — M542 Cervicalgia: Secondary | ICD-10-CM | POA: Diagnosis not present

## 2023-12-26 NOTE — Telephone Encounter (Signed)
Noted. Thanks.  I will defer to patient.   °

## 2024-01-02 DIAGNOSIS — H8111 Benign paroxysmal vertigo, right ear: Secondary | ICD-10-CM | POA: Diagnosis not present

## 2024-01-02 DIAGNOSIS — R293 Abnormal posture: Secondary | ICD-10-CM | POA: Diagnosis not present

## 2024-01-02 DIAGNOSIS — M542 Cervicalgia: Secondary | ICD-10-CM | POA: Diagnosis not present

## 2024-01-09 DIAGNOSIS — R293 Abnormal posture: Secondary | ICD-10-CM | POA: Diagnosis not present

## 2024-01-09 DIAGNOSIS — M542 Cervicalgia: Secondary | ICD-10-CM | POA: Diagnosis not present

## 2024-01-09 DIAGNOSIS — H8111 Benign paroxysmal vertigo, right ear: Secondary | ICD-10-CM | POA: Diagnosis not present

## 2024-01-14 ENCOUNTER — Encounter: Payer: Self-pay | Admitting: Ophthalmology

## 2024-01-16 DIAGNOSIS — H8111 Benign paroxysmal vertigo, right ear: Secondary | ICD-10-CM | POA: Diagnosis not present

## 2024-01-16 DIAGNOSIS — M542 Cervicalgia: Secondary | ICD-10-CM | POA: Diagnosis not present

## 2024-01-16 DIAGNOSIS — R293 Abnormal posture: Secondary | ICD-10-CM | POA: Diagnosis not present

## 2024-01-17 NOTE — Discharge Instructions (Signed)

## 2024-01-22 ENCOUNTER — Ambulatory Visit
Admission: RE | Admit: 2024-01-22 | Discharge: 2024-01-22 | Disposition: A | Discharge status: Home / Self Care | Attending: Ophthalmology | Admitting: Ophthalmology

## 2024-01-22 ENCOUNTER — Encounter: Payer: Self-pay | Admitting: Ophthalmology

## 2024-01-22 ENCOUNTER — Ambulatory Visit: Payer: Self-pay | Admitting: Anesthesiology

## 2024-01-22 ENCOUNTER — Other Ambulatory Visit: Payer: Self-pay

## 2024-01-22 ENCOUNTER — Encounter
Admission: RE | Disposition: A | Discharge status: Home / Self Care | Payer: Self-pay | Source: Home / Self Care | Attending: Ophthalmology

## 2024-01-22 DIAGNOSIS — I129 Hypertensive chronic kidney disease with stage 1 through stage 4 chronic kidney disease, or unspecified chronic kidney disease: Secondary | ICD-10-CM | POA: Insufficient documentation

## 2024-01-22 DIAGNOSIS — K219 Gastro-esophageal reflux disease without esophagitis: Secondary | ICD-10-CM | POA: Diagnosis not present

## 2024-01-22 DIAGNOSIS — K449 Diaphragmatic hernia without obstruction or gangrene: Secondary | ICD-10-CM | POA: Insufficient documentation

## 2024-01-22 DIAGNOSIS — H2511 Age-related nuclear cataract, right eye: Secondary | ICD-10-CM | POA: Insufficient documentation

## 2024-01-22 DIAGNOSIS — F419 Anxiety disorder, unspecified: Secondary | ICD-10-CM | POA: Diagnosis not present

## 2024-01-22 DIAGNOSIS — Z87891 Personal history of nicotine dependence: Secondary | ICD-10-CM | POA: Insufficient documentation

## 2024-01-22 DIAGNOSIS — F32A Depression, unspecified: Secondary | ICD-10-CM | POA: Diagnosis not present

## 2024-01-22 DIAGNOSIS — N189 Chronic kidney disease, unspecified: Secondary | ICD-10-CM | POA: Insufficient documentation

## 2024-01-22 HISTORY — DX: Chronic kidney disease, unspecified: N18.9

## 2024-01-22 HISTORY — DX: Sensorineural hearing loss, bilateral: H90.3

## 2024-01-22 HISTORY — DX: Personal history of urinary calculi: Z87.442

## 2024-01-22 HISTORY — DX: Benign paroxysmal vertigo, right ear: H81.11

## 2024-01-22 HISTORY — PX: CATARACT EXTRACTION W/PHACO: SHX586

## 2024-01-22 HISTORY — DX: Personal history of other diseases of the musculoskeletal system and connective tissue: Z87.39

## 2024-01-22 HISTORY — DX: Panic disorder (episodic paroxysmal anxiety): F41.0

## 2024-01-22 SURGERY — PHACOEMULSIFICATION, CATARACT, WITH IOL INSERTION
Anesthesia: Monitor Anesthesia Care | Site: Eye | Laterality: Right

## 2024-01-22 MED ORDER — MIDAZOLAM HCL 2 MG/2ML IJ SOLN
INTRAMUSCULAR | Status: DC | PRN
Start: 2024-01-22 — End: 2024-01-22
  Administered 2024-01-22 (×2): 1 mg via INTRAVENOUS

## 2024-01-22 MED ORDER — FENTANYL CITRATE (PF) 100 MCG/2ML IJ SOLN
INTRAMUSCULAR | Status: AC
  Filled 2024-01-22: qty 2

## 2024-01-22 MED ORDER — MOXIFLOXACIN HCL 0.5 % OP SOLN
OPHTHALMIC | Status: DC | PRN
  Administered 2024-01-22: .2 mL via OPHTHALMIC

## 2024-01-22 MED ORDER — ARMC OPHTHALMIC DILATING DROPS
1.0000 | OPHTHALMIC | Status: DC | PRN
  Administered 2024-01-22 (×3): 1 via OPHTHALMIC

## 2024-01-22 MED ORDER — SIGHTPATH DOSE#1 BSS IO SOLN
INTRAOCULAR | Status: DC | PRN
Start: 2024-01-22 — End: 2024-01-22
  Administered 2024-01-22: 15 mL via INTRAOCULAR

## 2024-01-22 MED ORDER — TETRACAINE HCL 0.5 % OP SOLN
1.0000 [drp] | OPHTHALMIC | Status: DC | PRN
Start: 2024-01-22 — End: 2024-01-22
  Administered 2024-01-22 (×3): 1 [drp] via OPHTHALMIC

## 2024-01-22 MED ORDER — SIGHTPATH DOSE#1 BSS IO SOLN
INTRAOCULAR | Status: DC | PRN
  Administered 2024-01-22: 52 mL via OPHTHALMIC

## 2024-01-22 MED ORDER — SIGHTPATH DOSE#1 NA CHONDROIT SULF-NA HYALURON 40-17 MG/ML IO SOLN
INTRAOCULAR | Status: DC | PRN
  Administered 2024-01-22: 1 mL via INTRAOCULAR

## 2024-01-22 MED ORDER — FENTANYL CITRATE (PF) 100 MCG/2ML IJ SOLN
INTRAMUSCULAR | Status: DC | PRN
  Administered 2024-01-22: 50 ug via INTRAVENOUS

## 2024-01-22 MED ORDER — ARMC OPHTHALMIC DILATING DROPS
OPHTHALMIC | Status: AC
  Filled 2024-01-22: qty 0.5

## 2024-01-22 MED ORDER — LACTATED RINGERS IV SOLN: INTRAVENOUS | Status: DC

## 2024-01-22 MED ORDER — MIDAZOLAM HCL 2 MG/2ML IJ SOLN
INTRAMUSCULAR | Status: AC
  Filled 2024-01-22: qty 2

## 2024-01-22 MED ORDER — LIDOCAINE HCL (PF) 2 % IJ SOLN
INTRAOCULAR | Status: DC | PRN
  Administered 2024-01-22: 2 mL

## 2024-01-22 MED ORDER — BRIMONIDINE TARTRATE-TIMOLOL 0.2-0.5 % OP SOLN
OPHTHALMIC | Status: DC | PRN
  Administered 2024-01-22: 1 [drp] via OPHTHALMIC

## 2024-01-22 MED ORDER — TETRACAINE HCL 0.5 % OP SOLN
OPHTHALMIC | Status: AC
Start: 2024-01-22 — End: 2024-01-22
  Filled 2024-01-22: qty 4

## 2024-01-22 SURGICAL SUPPLY — 9 items
CYSTOTOME ANGL RVRS SHRT 25G (CUTTER) ×1 IMPLANT
FEE CATARACT SUITE SIGHTPATH (MISCELLANEOUS) ×1 IMPLANT
GLOVE BIOGEL PI IND STRL 8 (GLOVE) ×1 IMPLANT
GLOVE SURG LX STRL 8.0 MICRO (GLOVE) ×1 IMPLANT
GLOVE SURG SYN 6.5 PF PI BL (GLOVE) ×1 IMPLANT
LENS IOL PANO PRO TORC 20.0 IMPLANT
NDL FILTER BLUNT 18X1 1/2 (NEEDLE) ×1 IMPLANT
NEEDLE FILTER BLUNT 18X1 1/2 (NEEDLE) ×1 IMPLANT
SYR 3ML LL SCALE MARK (SYRINGE) ×1 IMPLANT

## 2024-01-22 NOTE — Op Note (Signed)
 PREOPERATIVE DIAGNOSIS:  Nuclear sclerotic cataract of the right eye.   POSTOPERATIVE DIAGNOSIS:  Nuclear sclerotic cataract of the right eye.   OPERATIVE PROCEDURE: Procedure(s): PHACOEMULSIFICATION, CATARACT, WITH IOL INSERTION 10.90 01:01.7   SURGEON:  Elsie Carmine, MD.   ANESTHESIA: 1.      Managed anesthesia care. 2.     0.77ml of Shugarcaine was instilled following the paracentesis  Anesthesiologist: Ola Donny BROCKS, MD CRNA: Jahoo, Sonia, CRNA  COMPLICATIONS:  None.   TECHNIQUE:   Stop and chop    DESCRIPTION OF PROCEDURE:  The patient was examined and consented in the preoperative holding area where the aforementioned topical anesthesia was applied to the right eye.  The patient was brought back to the Operating Room where he was sat upright on the gurney and given a target to fixate upon while the eye was marked at the 3:00 and 9:00 position.  The patient was then reclined on the operating table.  The eye was prepped and draped in the usual sterile ophthalmic fashion and a lid speculum was placed. A paracentesis was created with the side port blade and the anterior chamber was filled with viscoelastic. A near clear corneal incision was performed with the steel keratome. A continuous curvilinear capsulorrhexis was performed with a cystotome followed by the capsulorrhexis forceps. Hydrodissection and hydrodelineation were carried out with BSS on a blunt cannula. The lens was removed in a stop and chop technique and the remaining cortical material was removed with the irrigation-aspiration handpiece. The eye was inflated with viscoelastic and the ZCT  lens  was placed in the eye and rotated to within a few degrees of the predetermined orientation.  The remaining viscoelastic was removed from the eye.  The Sinskey hook was used to rotate the toric lens into its final resting place at 010 degrees.  0. The eye was inflated to a physiologic pressure and found to be watertight. 0.1ml of  Vigamox  was placed in the anterior chamber.  The eye was dressed with Vigamox . The patient was given protective glasses to wear throughout the day and a shield with which to sleep tonight. The patient was also given drops with which to begin a drop regimen today and will follow-up with me in one day. Implant Name Type Inv. Item Serial No. Manufacturer Lot No. LRB No. Used Action  LENS IOL [employer] PanOptix Pro Toric 20.0 Intraocular Lens  [id number] SIGHTPATH  Right 1 Implanted   Procedure(s): PHACOEMULSIFICATION, CATARACT, WITH IOL INSERTION 10.90 01:01.7 (Right)  Electronically signed: Elsie Carmine 01/22/2024 10:01 AM

## 2024-01-22 NOTE — Anesthesia Postprocedure Evaluation (Signed)
 Anesthesia Post Note  Patient: David Mcgee  Procedure(s) Performed: PHACOEMULSIFICATION, CATARACT, WITH IOL INSERTION 10.90 01:01.7 (Right: Eye)  Patient location during evaluation: PACU Anesthesia Type: MAC Level of consciousness: awake and alert Pain management: pain level controlled Vital Signs Assessment: post-procedure vital signs reviewed and stable Respiratory status: spontaneous breathing, nonlabored ventilation, respiratory function stable and patient connected to nasal cannula oxygen Cardiovascular status: stable and blood pressure returned to baseline Postop Assessment: no apparent nausea or vomiting Anesthetic complications: no   No notable events documented.   Last Vitals:  Vitals:   01/22/24 1001 01/22/24 1005  BP: 124/75 120/75  Pulse: (!) 57 (!) 57  Resp: 10 12  Temp: (!) 36.3 C (!) 36.3 C  SpO2: 96% 95%    Last Pain:  Vitals:   01/22/24 1005  TempSrc:   PainSc: 0-No pain                 Tashiba Timoney C Burnice Oestreicher

## 2024-01-22 NOTE — H&P (Signed)
 [facility]   Primary Care Physician:  Cleatus Arlyss RAMAN, MD Ophthalmologist: Dr. Ollie  Pre-Procedure History & Physical: HPI:  David Mcgee is a 78 y.o. male here for cataract surgery.   Past Medical History:  Diagnosis Date   Arthritis    back, knees, hands - OA   Benign paroxysmal vertigo, right ear    Bilateral sensorineural hearing loss    Cancer (HCC)    Skin, not melanoma   Cataract    Chronic kidney disease    loss of kidney function - L - since 1990's, h/o of renal calculi. 45% total function on right   Chronic renal insufficiency    Depression    ? panic attacks, takes sertraline  & also sees psych q 3 months    GERD (gastroesophageal reflux disease)    H/O degenerative disc disease    H/O hiatal hernia    History of kidney stones    History of stress test    wnl   Hypertension    Insomnia    Palpitations    Panic attacks    PTSD (post-traumatic stress disorder)    post [place]   Spinal cord cysts     Past Surgical History:  Procedure Laterality Date   BACK SURGERY     COSMETIC SURGERY     ESOPHAGOGASTRODUODENOSCOPY  06/1995   Dilitation bx negative (Dr. Neita)   HERNIA REPAIR     Hernoiorraphy     Right; years ago   JOINT REPLACEMENT Bilateral    KNEE ARTHROPLASTY Right 08/18/2015   Procedure: COMPUTER ASSISTED TOTAL KNEE ARTHROPLASTY;  Surgeon: Lynwood SHAUNNA Hue, MD;  Location: [facility];  Service: Orthopedics;  Laterality: Right;   KNEE ARTHROSCOPY  10/2000   Right Knee (Dr. Cleotilde)   LUMBAR LAMINECTOMY/DECOMPRESSION MICRODISCECTOMY Right 12/09/2012   Procedure: LUMBAR LAMINECTOMY/DECOMPRESSION MICRODISCECTOMY 1 LEVEL;  Surgeon: Arley SHAUNNA Helling, MD;  Location: [facility];  Service: Neurosurgery;  Laterality: Right;  LUMBAR LAMINECTOMY/DECOMPRESSION MICRODISCECTOMY 1 LEVEL   MOHS SURGERY  01/26/2017   nose   NEUROMA SURGERY Right 2000   Right foot Morton's neuroma excision   SHOULDER ARTHROSCOPY WITH SUBACROMIAL DECOMPRESSION AND BICEP TENDON  REPAIR Left 07/04/2018   Procedure: SHOULDER ARTHROSCOPY WITH DEBRIDEMENT, DECOMPRESSION, REPAIR OF PARTIAL THICKNESS ROTATOR CUFF TEAR, BICEP TENODESIS WITH POSSIBLE DISTAL CLAVICLE EXCISION;  Surgeon: Edie Norleen PARAS, MD;  Location: [facility];  Service: Orthopedics;  Laterality: Left;   TONSILLECTOMY     TOTAL KNEE ARTHROPLASTY Bilateral 2013, 2016    Prior to Admission medications   Medication Sig Start Date End Date Taking? Authorizing Provider  amLODipine  (NORVASC ) 10 MG tablet TAKE 1 TABLET BY MOUTH EVERYDAY AT BEDTIME 04/03/19  Yes Cleatus Arlyss RAMAN, MD  ammonium lactate (AMLACTIN) 12 % cream Apply 1 g topically as needed (itchy skin).   Yes [provider]  aspirin  EC 81 MG tablet Take 81 mg by mouth daily.   Yes [provider]  atorvastatin  (LIPITOR) 10 MG tablet Take 10 mg by mouth daily.   Yes [provider]  Carboxymethylcellulose Sodium (THERATEARS OP) Apply 1 drop to eye daily as needed (dry eyes).   Yes [provider]  cholecalciferol (VITAMIN D3) 25 MCG (1000 UT) tablet Take 1,000 Units by mouth daily.   Yes [provider]  docusate sodium  (COLACE) 100 MG capsule Take 200 mg by mouth 2 (two) times daily as needed for mild constipation.   Yes [provider]  hydrocortisone  (ANUSOL -HC) 2.5 % rectal cream  Place 1 application. rectally daily as needed for hemorrhoids or anal itching.   Yes [provider]  hydrocortisone  2.5 % ointment Apply topically 2 (two) times daily. 08/14/23  Yes Paci, Karina M, MD  lisinopril  (PRINIVIL ,ZESTRIL ) 5 MG tablet Take 2.5 mg by mouth daily.   Yes [provider]  Multiple Vitamin (MULTIVITAMIN) tablet Take 1 tablet by mouth daily.   Yes [provider]  Omega-3 Fatty Acids (FISH OIL) 1000 MG CAPS Take 1,000 mg by mouth 2 (two) times daily.    Yes [provider]  omeprazole  (PRILOSEC) 20 MG capsule Take 1 capsule (20 mg total) by mouth daily. 12/11/22  Yes Cleatus Arlyss RAMAN, MD  sertraline  (ZOLOFT ) 100 MG tablet Take 100 mg by mouth daily.   Yes [provider]  tamsulosin  (FLOMAX ) 0.4 MG CAPS capsule Take 0.4 mg by mouth at bedtime.    Yes [provider]  triamcinolone  (KENALOG ) 0.025 % ointment Apply 1 Application topically 2 (two) times daily. 08/14/23  Yes Paci, Karina M, MD  Melatonin 5 MG TABS Take 5 mg by mouth at bedtime as needed. Patient not taking: Reported on 01/14/2024    [provider]    Allergies as of 01/11/2024 - Review Complete 12/07/2023  Allergen Reaction Noted   Nsaids Other (See Comments) 06/27/2018   Augmentin  [amoxicillin -pot clavulanate] Diarrhea 09/15/2019   Terazosin  06/20/2017   Azithromycin Nausea Only 10/03/2007   Trazodone  and nefazodone  04/01/2018    Family History  Problem Relation Age of Onset   Hypertension Mother    Diabetes Mother    Kidney failure Father    Hypertension Father    Obesity Sister    Diabetes Sister    Diabetes Brother    Hypertension Brother    Hyperlipidemia Brother    Stroke Brother    Depression Brother        ?   Colon cancer Neg Hx    Prostate cancer Neg Hx     Social History   Socioeconomic History   Marital status: Married    Spouse name: David Mcgee   Number of children: Not on file   Years of education: Not on file   Highest education level: 12th grade  Occupational History   Occupation: Retired     Associate Professor: [employer]   Occupation: deacon at [employer]  Tobacco Use   Smoking status: Former    Current packs/day: 0.00    Average packs/day: 1.5 packs/day for 9.0 years (13.5 ttl pk-yrs)    Types: Cigarettes    Start date: 05/22/1960    Quit date: 05/22/1969    Years since quitting: 54.7   Smokeless tobacco: Never   Tobacco comments:    quit in 1972  Vaping Use   Vaping status: Never Used  Substance and Sexual Activity   Alcohol use: No   Drug use: No   Sexual activity: Yes    Birth control/protection: None  Other Topics Concern   Not on  file  Social History Narrative   Married (second marriage 1989) and lives with wife   One child, 4 grandkids (2 step and 2 biological).   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  Agent orange exposure.   Retired from [employer], Public relations account executive   Social Drivers of [employer]: Low Risk  (11/28/2023)   Overall Financial Resource Strain (CARDIA)    Difficulty of Paying Living Expenses: Not hard at all  Food Insecurity: No Food Insecurity (11/28/2023)  Hunger Vital Sign    Worried About Running Out of Food in the Last Year: Never true    Ran Out of Food in the Last Year: Never true  Transportation Needs: No Transportation Needs (11/28/2023)   PRAPARE - Administrator, Civil Service (Medical): No    Lack of Transportation (Non-Medical): No  Physical Activity: Insufficiently Active (11/28/2023)   Exercise Vital Sign    Days of Exercise per Week: 3 days    Minutes of Exercise per Session: 40 min  Stress: Stress Concern Present (11/28/2023)   [employer] of Occupational Health - Occupational Stress Questionnaire    Feeling of Stress: To some extent  Social Connections: Socially Integrated (11/28/2023)   Social Connection and Isolation Panel    Frequency of Communication with Friends and Family: More than three times a week    Frequency of Social Gatherings with Friends and Family: Twice a week    Attends Religious Services: More than 4 times per year    Active Member of [employer] or Organizations: Yes    Attends Banker Meetings: More than 4 times per year    Marital Status: Married  Catering manager Violence: Not At Risk (11/30/2023)   Humiliation, Afraid, Rape, and Kick questionnaire    Fear of Current or Ex-Partner: No    Emotionally Abused: No    Physically Abused: No    Sexually Abused: No    Review of Systems: See HPI, otherwise negative ROS  Physical Exam: BP 137/80   Pulse 64   Temp 98 F (36.7 C) (Temporal)   Resp 16   Wt 107 kg   SpO2 96%    BMI 29.90 kg/m  General:   Alert, cooperative. Head:  Normocephalic and atraumatic. Respiratory:  Normal work of breathing. Cardiovascular:  NAD  Impression/Plan: KADRIAN PARTCH is here for cataract surgery.  Risks, benefits, limitations, and alternatives regarding cataract surgery have been reviewed with the patient.  Questions have been answered.  All parties agreeable.   Elsie Carmine, MD  01/22/2024, 9:24 AM

## 2024-01-22 NOTE — Anesthesia Preprocedure Evaluation (Addendum)
 Anesthesia Evaluation  Patient identified by MRN, date of birth, ID band Patient awake    Reviewed: Allergy & Precautions, H&P , NPO status , Patient's Chart, lab work & pertinent test results  Airway Mallampati: III  TM Distance: >3 FB Neck ROM: Full    Dental no notable dental hx. (+) Caps Caps upper teeth, not sure exactly which, has slight chip left upper central incisor:   Pulmonary former smoker   Pulmonary exam normal breath sounds clear to auscultation       Cardiovascular hypertension, Normal cardiovascular exam Rhythm:Regular Rate:Normal     Neuro/Psych  PSYCHIATRIC DISORDERS Anxiety Depression    negative neurological ROS  negative psych ROS   GI/Hepatic negative GI ROS, Neg liver ROS, hiatal hernia,GERD  ,,  Endo/Other  negative endocrine ROS    Renal/GU Renal diseasenegative Renal ROS  negative genitourinary   Musculoskeletal negative musculoskeletal ROS (+) Arthritis ,    Abdominal   Peds negative pediatric ROS (+)  Hematology negative hematology ROS (+)   Anesthesia Other Findings Palpitations  GERD (gastroesophageal reflux disease) Insomnia  Spinal cord cysts Hypertension  History of stress test Depression H/O hiatal hernia Arthritis  Cancer (HCC) PTSD (post-traumatic stress disorder) Chronic kidney disease Cataract  Bilateral sensorineural hearing loss Panic attacks  Chronic renal insufficiency, left not working due to kidney stones; right 45% History of kidney stones  Benign paroxysmal vertigo, right ear H/O degenerative disc disease     Reproductive/Obstetrics negative OB ROS                              Anesthesia Physical Anesthesia Plan  ASA: 3  Anesthesia Plan: MAC   Post-op Pain Management:    Induction: Intravenous  PONV Risk Score and Plan:   Airway Management Planned: Natural Airway and Nasal Cannula  Additional Equipment:   Intra-op  Plan:   Post-operative Plan:   Informed Consent: I have reviewed the patients History and Physical, chart, labs and discussed the procedure including the risks, benefits and alternatives for the proposed anesthesia with the patient or authorized representative who has indicated his/her understanding and acceptance.     Dental Advisory Given  Plan Discussed with: Anesthesiologist, CRNA and Surgeon  Anesthesia Plan Comments: (Patient consented for risks of anesthesia including but not limited to:  - adverse reactions to medications - damage to eyes, teeth, lips or other oral mucosa - nerve damage due to positioning  - sore throat or hoarseness - Damage to heart, brain, nerves, lungs, other parts of body or loss of life  Patient voiced understanding and assent.)         Anesthesia Quick Evaluation

## 2024-01-22 NOTE — Transfer of Care (Signed)
 Immediate Anesthesia Transfer of Care Note  Patient: David Mcgee  Procedure(s) Performed: PHACOEMULSIFICATION, CATARACT, WITH IOL INSERTION 10.90 01:01.7 (Right: Eye)  Patient Location: PACU  Anesthesia Type: MAC  Level of Consciousness: awake, alert  and patient cooperative  Airway and Oxygen Therapy: Patient Spontanous Breathing and Patient connected to supplemental oxygen  Post-op Assessment: Post-op Vital signs reviewed, Patient's Cardiovascular Status Stable, Respiratory Function Stable, Patent Airway and No signs of Nausea or vomiting  Post-op Vital Signs: Reviewed and stable  Complications: No notable events documented.

## 2024-01-25 ENCOUNTER — Encounter: Payer: Self-pay | Admitting: Ophthalmology

## 2024-01-28 NOTE — Anesthesia Preprocedure Evaluation (Addendum)
 Anesthesia Evaluation  Patient identified by MRN, date of birth, ID band Patient awake    Reviewed: Allergy & Precautions, H&P , NPO status , Patient's Chart, lab work & pertinent test results  Airway Mallampati: III  TM Distance: >3 FB Neck ROM: Full    Dental no notable dental hx. (+) Caps Caps Caps upper teeth, not sure exactly which, has slight chip left upper central incisor:  :   Pulmonary neg pulmonary ROS, former smoker   Pulmonary exam normal breath sounds clear to auscultation       Cardiovascular hypertension, negative cardio ROS Normal cardiovascular exam Rhythm:Regular Rate:Normal     Neuro/Psych  PSYCHIATRIC DISORDERS Anxiety Depression    negative neurological ROS  negative psych ROS   GI/Hepatic negative GI ROS, Neg liver ROS, hiatal hernia,GERD  ,,  Endo/Other  negative endocrine ROS    Renal/GU Renal diseasenegative Renal ROS  negative genitourinary   Musculoskeletal negative musculoskeletal ROS (+) Arthritis ,    Abdominal   Peds negative pediatric ROS (+)  Hematology negative hematology ROS (+)   Anesthesia Other Findings Previous cataract surgery 01-21-22 Dr. Ola   Palpitations  GERD (gastroesophageal reflux disease) Insomnia  Spinal cord cysts Hypertension  History of stress test Depression  H/O hiatal hernia Arthritis  Cancer (HCC) PTSD (post-traumatic stress disorder) Chronic kidney disease Cataract  Bilateral sensorineural hearing loss Panic attacks  Chronic renal insufficiency History of kidney stones  Benign paroxysmal vertigo, right ear H/O degenerative disc disease     Reproductive/Obstetrics negative OB ROS                              Anesthesia Physical Anesthesia Plan  ASA: 3  Anesthesia Plan: MAC   Post-op Pain Management:    Induction: Intravenous  PONV Risk Score and Plan:   Airway Management Planned: Natural Airway and  Nasal Cannula  Additional Equipment:   Intra-op Plan:   Post-operative Plan:   Informed Consent: I have reviewed the patients History and Physical, chart, labs and discussed the procedure including the risks, benefits and alternatives for the proposed anesthesia with the patient or authorized representative who has indicated his/her understanding and acceptance.     Dental Advisory Given  Plan Discussed with: Anesthesiologist, CRNA and Surgeon  Anesthesia Plan Comments: (Patient consented for risks of anesthesia including but not limited to:  - adverse reactions to medications - damage to eyes, teeth, lips or other oral mucosa - nerve damage due to positioning  - sore throat or hoarseness - Damage to heart, brain, nerves, lungs, other parts of body or loss of life  Patient voiced understanding and assent.)         Anesthesia Quick Evaluation

## 2024-01-31 NOTE — Discharge Instructions (Signed)

## 2024-02-01 DIAGNOSIS — R293 Abnormal posture: Secondary | ICD-10-CM | POA: Diagnosis not present

## 2024-02-01 DIAGNOSIS — H8111 Benign paroxysmal vertigo, right ear: Secondary | ICD-10-CM | POA: Diagnosis not present

## 2024-02-01 DIAGNOSIS — M542 Cervicalgia: Secondary | ICD-10-CM | POA: Diagnosis not present

## 2024-02-05 ENCOUNTER — Encounter: Payer: Self-pay | Admitting: Ophthalmology

## 2024-02-05 ENCOUNTER — Ambulatory Visit: Payer: Self-pay | Admitting: Anesthesiology

## 2024-02-05 ENCOUNTER — Ambulatory Visit
Admission: RE | Admit: 2024-02-05 | Discharge: 2024-02-05 | Disposition: A | Discharge status: Home / Self Care | Attending: Ophthalmology | Admitting: Ophthalmology

## 2024-02-05 ENCOUNTER — Encounter
Admission: RE | Disposition: A | Discharge status: Home / Self Care | Payer: Self-pay | Source: Home / Self Care | Attending: Ophthalmology

## 2024-02-05 ENCOUNTER — Other Ambulatory Visit: Payer: Self-pay

## 2024-02-05 DIAGNOSIS — M1990 Unspecified osteoarthritis, unspecified site: Secondary | ICD-10-CM | POA: Insufficient documentation

## 2024-02-05 DIAGNOSIS — I129 Hypertensive chronic kidney disease with stage 1 through stage 4 chronic kidney disease, or unspecified chronic kidney disease: Secondary | ICD-10-CM | POA: Insufficient documentation

## 2024-02-05 DIAGNOSIS — N189 Chronic kidney disease, unspecified: Secondary | ICD-10-CM | POA: Insufficient documentation

## 2024-02-05 DIAGNOSIS — F32A Depression, unspecified: Secondary | ICD-10-CM | POA: Insufficient documentation

## 2024-02-05 DIAGNOSIS — H2512 Age-related nuclear cataract, left eye: Secondary | ICD-10-CM | POA: Insufficient documentation

## 2024-02-05 DIAGNOSIS — Z87891 Personal history of nicotine dependence: Secondary | ICD-10-CM | POA: Insufficient documentation

## 2024-02-05 DIAGNOSIS — K219 Gastro-esophageal reflux disease without esophagitis: Secondary | ICD-10-CM | POA: Insufficient documentation

## 2024-02-05 DIAGNOSIS — Z9841 Cataract extraction status, right eye: Secondary | ICD-10-CM | POA: Insufficient documentation

## 2024-02-05 DIAGNOSIS — Z961 Presence of intraocular lens: Secondary | ICD-10-CM | POA: Diagnosis not present

## 2024-02-05 DIAGNOSIS — K449 Diaphragmatic hernia without obstruction or gangrene: Secondary | ICD-10-CM | POA: Insufficient documentation

## 2024-02-05 DIAGNOSIS — Z833 Family history of diabetes mellitus: Secondary | ICD-10-CM | POA: Insufficient documentation

## 2024-02-05 DIAGNOSIS — F419 Anxiety disorder, unspecified: Secondary | ICD-10-CM | POA: Diagnosis not present

## 2024-02-05 HISTORY — PX: CATARACT EXTRACTION W/PHACO: SHX586

## 2024-02-05 SURGERY — PHACOEMULSIFICATION, CATARACT, WITH IOL INSERTION
Anesthesia: Monitor Anesthesia Care | Site: Eye | Laterality: Left

## 2024-02-05 MED ORDER — ARMC OPHTHALMIC DILATING DROPS
1.0000 | OPHTHALMIC | Status: DC | PRN
  Administered 2024-02-05 (×3): 1 via OPHTHALMIC

## 2024-02-05 MED ORDER — PHENYLEPHRINE-KETOROLAC 1-0.3 % IO SOLN
INTRAOCULAR | Status: DC | PRN
  Administered 2024-02-05: 46 mL via OPHTHALMIC

## 2024-02-05 MED ORDER — SIGHTPATH DOSE#1 NA CHONDROIT SULF-NA HYALURON 40-17 MG/ML IO SOLN
INTRAOCULAR | Status: DC | PRN
  Administered 2024-02-05: 1 mL via INTRAOCULAR

## 2024-02-05 MED ORDER — MIDAZOLAM HCL 2 MG/2ML IJ SOLN
INTRAMUSCULAR | Status: AC
  Filled 2024-02-05: qty 2

## 2024-02-05 MED ORDER — LIDOCAINE HCL (PF) 2 % IJ SOLN
INTRAOCULAR | Status: DC | PRN
  Administered 2024-02-05: 2 mL

## 2024-02-05 MED ORDER — ARMC OPHTHALMIC DILATING DROPS
OPHTHALMIC | Status: AC
  Filled 2024-02-05: qty 0.5

## 2024-02-05 MED ORDER — TETRACAINE HCL 0.5 % OP SOLN
1.0000 [drp] | OPHTHALMIC | Status: DC | PRN
  Administered 2024-02-05 (×3): 1 [drp] via OPHTHALMIC

## 2024-02-05 MED ORDER — FENTANYL CITRATE (PF) 100 MCG/2ML IJ SOLN
INTRAMUSCULAR | Status: DC | PRN
  Administered 2024-02-05: 50 ug via INTRAVENOUS

## 2024-02-05 MED ORDER — FENTANYL CITRATE (PF) 100 MCG/2ML IJ SOLN: INTRAMUSCULAR | Status: DC | PRN

## 2024-02-05 MED ORDER — SIGHTPATH DOSE#1 BSS IO SOLN
INTRAOCULAR | Status: DC | PRN
  Administered 2024-02-05: 15 mL via INTRAOCULAR

## 2024-02-05 MED ORDER — MIDAZOLAM HCL 2 MG/2ML IJ SOLN
INTRAMUSCULAR | Status: DC | PRN
  Administered 2024-02-05: 2 mg via INTRAVENOUS

## 2024-02-05 MED ORDER — TETRACAINE HCL 0.5 % OP SOLN
OPHTHALMIC | Status: AC
  Filled 2024-02-05: qty 4

## 2024-02-05 MED ORDER — FENTANYL CITRATE (PF) 100 MCG/2ML IJ SOLN
INTRAMUSCULAR | Status: AC
  Filled 2024-02-05: qty 2

## 2024-02-05 MED ORDER — LACTATED RINGERS IV SOLN
INTRAVENOUS | Status: DC
Start: 2024-02-05 — End: 2024-02-05

## 2024-02-05 MED ORDER — BRIMONIDINE TARTRATE-TIMOLOL 0.2-0.5 % OP SOLN
OPHTHALMIC | Status: DC | PRN
  Administered 2024-02-05: 1 [drp] via OPHTHALMIC

## 2024-02-05 SURGICAL SUPPLY — 11 items
CANNULA ANT/CHMB 27G (MISCELLANEOUS) ×1 IMPLANT
CYSTOTOME ANGL RVRS SHRT 25G (CUTTER) ×1 IMPLANT
FEE CATARACT SUITE SIGHTPATH (MISCELLANEOUS) ×1 IMPLANT
GLOVE BIOGEL PI IND STRL 8 (GLOVE) ×1 IMPLANT
GLOVE SURG LX STRL 8.0 MICRO (GLOVE) ×1 IMPLANT
GLOVE SURG SYN 6.5 PF PI BL (GLOVE) ×1 IMPLANT
LENS IOL PANO PRO TORC 19.5 ×1 IMPLANT
LENS IOL PANO PRO TORC 5 19.5 IMPLANT
NDL FILTER BLUNT 18X1 1/2 (NEEDLE) ×1 IMPLANT
NEEDLE FILTER BLUNT 18X1 1/2 (NEEDLE) ×1 IMPLANT
SYR 3ML LL SCALE MARK (SYRINGE) ×1 IMPLANT

## 2024-02-05 NOTE — Transfer of Care (Signed)
 Immediate Anesthesia Transfer of Care Note  Patient: JOTHAM AHN  Procedure(s) Performed: PHACOEMULSIFICATION, CATARACT, WITH IOL INSERTION 10.47 01:08.1 (Left: Eye)  Patient Location: PACU  Anesthesia Type: MAC  Level of Consciousness: awake, alert  and patient cooperative  Airway and Oxygen Therapy: Patient Spontanous Breathing and Patient connected to supplemental oxygen  Post-op Assessment: Post-op Vital signs reviewed, Patient's Cardiovascular Status Stable, Respiratory Function Stable, Patent Airway and No signs of Nausea or vomiting  Post-op Vital Signs: Reviewed and stable  Complications: No notable events documented.

## 2024-02-05 NOTE — H&P (Signed)
 [facility]   Primary Care Physician:  Cleatus Arlyss RAMAN, MD Ophthalmologist: Dr. Elsie Carmine  Pre-Procedure History & Physical: HPI:  David Mcgee is a 78 y.o. male here for cataract surgery.   Past Medical History:  Diagnosis Date   Arthritis    back, knees, hands - OA   Benign paroxysmal vertigo, right ear    Bilateral sensorineural hearing loss    Cancer (HCC)    Skin, not melanoma   Cataract    Chronic kidney disease    loss of kidney function - L - since 1990's, h/o of renal calculi. 45% total function on right   Chronic renal insufficiency    Depression    ? panic attacks, takes sertraline  & also sees psych q 3 months    GERD (gastroesophageal reflux disease)    H/O degenerative disc disease    H/O hiatal hernia    History of kidney stones    History of stress test    wnl   Hypertension    Insomnia    Palpitations    Panic attacks    PTSD (post-traumatic stress disorder)    post [place]   Spinal cord cysts     Past Surgical History:  Procedure Laterality Date   BACK SURGERY     CATARACT EXTRACTION W/PHACO Right 01/22/2024   Procedure: PHACOEMULSIFICATION, CATARACT, WITH IOL INSERTION 10.90 01:01.7;  Surgeon: Carmine Elsie, MD;  Location: [facility];  Service: Ophthalmology;  Laterality: Right;   COSMETIC SURGERY     ESOPHAGOGASTRODUODENOSCOPY  06/1995   Dilitation bx negative (Dr. Neita)   HERNIA REPAIR     Hernoiorraphy     Right; years ago   JOINT REPLACEMENT Bilateral    KNEE ARTHROPLASTY Right 08/18/2015   Procedure: COMPUTER ASSISTED TOTAL KNEE ARTHROPLASTY;  Surgeon: Lynwood SHAUNNA Hue, MD;  Location: [facility];  Service: Orthopedics;  Laterality: Right;   KNEE ARTHROSCOPY  10/2000   Right Knee (Dr. Cleotilde)   LUMBAR LAMINECTOMY/DECOMPRESSION MICRODISCECTOMY Right 12/09/2012   Procedure: LUMBAR LAMINECTOMY/DECOMPRESSION MICRODISCECTOMY 1 LEVEL;  Surgeon: Arley SHAUNNA Helling, MD;  Location: [facility];  Service: Neurosurgery;  Laterality:  Right;  LUMBAR LAMINECTOMY/DECOMPRESSION MICRODISCECTOMY 1 LEVEL   MOHS SURGERY  01/26/2017   nose   NEUROMA SURGERY Right 2000   Right foot Morton's neuroma excision   SHOULDER ARTHROSCOPY WITH SUBACROMIAL DECOMPRESSION AND BICEP TENDON REPAIR Left 07/04/2018   Procedure: SHOULDER ARTHROSCOPY WITH DEBRIDEMENT, DECOMPRESSION, REPAIR OF PARTIAL THICKNESS ROTATOR CUFF TEAR, BICEP TENODESIS WITH POSSIBLE DISTAL CLAVICLE EXCISION;  Surgeon: Edie Norleen PARAS, MD;  Location: [facility];  Service: Orthopedics;  Laterality: Left;   TONSILLECTOMY     TOTAL KNEE ARTHROPLASTY Bilateral 2013, 2016    Prior to Admission medications   Medication Sig Start Date End Date Taking? Authorizing Provider  amLODipine  (NORVASC ) 10 MG tablet TAKE 1 TABLET BY MOUTH EVERYDAY AT BEDTIME 04/03/19  Yes Cleatus Arlyss RAMAN, MD  ammonium lactate (AMLACTIN) 12 % cream Apply 1 g topically as needed (itchy skin).   Yes [provider]  aspirin  EC 81 MG tablet Take 81 mg by mouth daily.   Yes [provider]  atorvastatin  (LIPITOR) 10 MG tablet Take 10 mg by mouth daily.   Yes [provider]  Carboxymethylcellulose Sodium (THERATEARS OP) Apply 1 drop to eye daily as needed (dry eyes).   Yes [provider]  cholecalciferol (VITAMIN D3) 25 MCG (1000 UT) tablet Take 1,000 Units by mouth daily.   Yes [provider]  docusate sodium  (COLACE) 100 MG capsule Take 200 mg by mouth 2 (two) times daily as needed for mild constipation.   Yes [provider]  levothyroxine (SYNTHROID) 25 MCG tablet Take 25 mcg by mouth daily before breakfast.   Yes [provider]  lisinopril  (PRINIVIL ,ZESTRIL ) 5 MG tablet Take 2.5 mg by mouth daily.   Yes [provider]  Multiple Vitamin (MULTIVITAMIN) tablet Take 1 tablet by mouth daily.   Yes [provider]  Omega-3 Fatty Acids (FISH OIL) 1000 MG CAPS Take 1,000 mg by mouth 2 (two) times daily.    Yes [provider]   omeprazole  (PRILOSEC) 20 MG capsule Take 1 capsule (20 mg total) by mouth daily. 12/11/22  Yes Cleatus Arlyss RAMAN, MD  sertraline  (ZOLOFT ) 100 MG tablet Take 100 mg by mouth daily.   Yes [provider]  tamsulosin  (FLOMAX ) 0.4 MG CAPS capsule Take 0.4 mg by mouth at bedtime.    Yes [provider]  hydrocortisone  (ANUSOL -HC) 2.5 % rectal cream Place 1 application. rectally daily as needed for hemorrhoids or anal itching.    [provider]  hydrocortisone  2.5 % ointment Apply topically 2 (two) times daily. 08/14/23   Paci, Karina M, MD  Melatonin 5 MG TABS Take 5 mg by mouth at bedtime as needed. Patient not taking: Reported on 01/14/2024    [provider]  triamcinolone  (KENALOG ) 0.025 % ointment Apply 1 Application topically 2 (two) times daily. 08/14/23   Paci, Karina M, MD    Allergies as of 01/11/2024 - Review Complete 12/07/2023  Allergen Reaction Noted   Nsaids Other (See Comments) 06/27/2018   Augmentin  [amoxicillin -pot clavulanate] Diarrhea 09/15/2019   Terazosin  06/20/2017   Azithromycin Nausea Only 10/03/2007   Trazodone  and nefazodone  04/01/2018    Family History  Problem Relation Age of Onset   Hypertension Mother    Diabetes Mother    Kidney failure Father    Hypertension Father    Obesity Sister    Diabetes Sister    Diabetes Brother    Hypertension Brother    Hyperlipidemia Brother    Stroke Brother    Depression Brother        ?   Colon cancer Neg Hx    Prostate cancer Neg Hx     Social History   Socioeconomic History   Marital status: Married    Spouse name: joyce   Number of children: Not on file   Years of education: Not on file   Highest education level: 12th grade  Occupational History   Occupation: Retired     Associate Professor: [employer]   Occupation: deacon at [employer]  Tobacco Use   Smoking status: Former    Current packs/day: 0.00    Average packs/day: 1.5 packs/day for 9.0 years (13.5 ttl pk-yrs)    Types:  Cigarettes    Start date: 05/22/1960    Quit date: 05/22/1969    Years since quitting: 54.7   Smokeless tobacco: Never   Tobacco comments:    quit in 1972  Vaping Use   Vaping status: Never Used  Substance and Sexual Activity   Alcohol use: No   Drug use: No   Sexual activity: Yes    Birth control/protection: None  Other Topics Concern   Not on file  Social History Narrative   Married (second marriage 1989) and lives with wife   One child, 4 grandkids (2 step and 2 biological).   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  Agent orange exposure.   Retired from [employer], Public relations account executive   Social Drivers of [employer]: Low Risk  (11/28/2023)   Overall Financial Resource Strain (CARDIA)    Difficulty of Paying Living Expenses: Not hard at all  Food Insecurity: No Food Insecurity (11/28/2023)   Hunger Vital Sign    Worried About Running Out of Food in the Last Year: Never true    Ran Out of Food in the Last Year: Never true  Transportation Needs: No Transportation Needs (11/28/2023)   PRAPARE - Administrator, Civil Service (Medical): No    Lack of Transportation (Non-Medical): No  Physical Activity: Insufficiently Active (11/28/2023)   Exercise Vital Sign    Days of Exercise per Week: 3 days    Minutes of Exercise per Session: 40 min  Stress: Stress Concern Present (11/28/2023)   [employer] of Occupational Health - Occupational Stress Questionnaire    Feeling of Stress: To some extent  Social Connections: Socially Integrated (11/28/2023)   Social Connection and Isolation Panel    Frequency of Communication with Friends and Family: More than three times a week    Frequency of Social Gatherings with Friends and Family: Twice a week    Attends Religious Services: More than 4 times per year    Active Member of [employer] or Organizations: Yes    Attends Engineer, structural: More than 4 times per year    Marital Status: Married  Catering manager Violence: Not  At Risk (11/30/2023)   Humiliation, Afraid, Rape, and Kick questionnaire    Fear of Current or Ex-Partner: No    Emotionally Abused: No    Physically Abused: No    Sexually Abused: No    Review of Systems: See HPI, otherwise negative ROS  Physical Exam: BP (!) 152/94   Pulse 66   Temp 97.7 F (36.5 C)   Ht 6' 2.5 (1.892 m)   Wt 107.5 kg   SpO2 97%   BMI 30.02 kg/m  General:   Alert, cooperative. Head:  Normocephalic and atraumatic. Respiratory:  Normal work of breathing. Cardiovascular:  NAD  Impression/Plan: David Mcgee is here for cataract surgery.  Risks, benefits, limitations, and alternatives regarding cataract surgery have been reviewed with the patient.  Questions have been answered.  All parties agreeable.   Elsie Carmine, MD  02/05/2024, 8:48 AM

## 2024-02-05 NOTE — Anesthesia Postprocedure Evaluation (Signed)
 Anesthesia Post Note  Patient: JOSEHUA HAMMAR  Procedure(s) Performed: PHACOEMULSIFICATION, CATARACT, WITH IOL INSERTION 10.47 01:08.1 (Left: Eye)  Patient location during evaluation: PACU Anesthesia Type: MAC Level of consciousness: awake and alert Pain management: pain level controlled Vital Signs Assessment: post-procedure vital signs reviewed and stable Respiratory status: spontaneous breathing, nonlabored ventilation, respiratory function stable and patient connected to nasal cannula oxygen Cardiovascular status: stable and blood pressure returned to baseline Postop Assessment: no apparent nausea or vomiting Anesthetic complications: no   No notable events documented.   Last Vitals:  Vitals:   02/05/24 0915 02/05/24 0920  BP: 133/82 (!) 146/88  Pulse: 61 60  Resp: 15 12  Temp: (!) 36.1 C (!) 36.1 C  SpO2: 97% 97%    Last Pain:  Vitals:   02/05/24 0920  PainSc: 0-No pain                 Zigmund Linse C Semisi Biela

## 2024-02-05 NOTE — Op Note (Signed)
 PREOPERATIVE DIAGNOSIS:  Nuclear sclerotic cataract of the left eye.   POSTOPERATIVE DIAGNOSIS:  Nuclear sclerotic cataract of the left eye.   OPERATIVE PROCEDURE: Procedure(s): PHACOEMULSIFICATION, CATARACT, WITH IOL INSERTION 10.47 01:08.1   SURGEON:  Elsie Carmine, MD.   ANESTHESIA: 1.      Managed anesthesia care. 2.     0.33ml os Shugarcaine was instilled following the paracentesis [email or web address]@   COMPLICATIONS:  None.   TECHNIQUE:   Stop and chop    DESCRIPTION OF PROCEDURE:  The patient was examined and consented in the preoperative holding area where the aforementioned topical anesthesia was applied to the left eye.  The patient was brought back to the Operating Room where he was sat upright on the gurney and given a target to fixate upon while the eye was marked at the 3:00 and 9:00 position.  The patient was then reclined on the operating table.  The eye was prepped and draped in the usual sterile ophthalmic fashion and a lid speculum was placed. A paracentesis was created with the side port blade and the anterior chamber was filled with viscoelastic. A near clear corneal incision was performed with the steel keratome. A continuous curvilinear capsulorrhexis was performed with a cystotome followed by the capsulorrhexis forceps. Hydrodissection and hydrodelineation were carried out with BSS on a blunt cannula. The lens was removed in a stop and chop technique and the remaining cortical material was removed with the irrigation-aspiration handpiece. The eye was inflated with viscoelastic and the intraocular lens was placed in the eye and rotated to within a few degrees of the predetermined orientation.  The remaining viscoelastic was removed from the eye.  The Sinskey hook was used to rotate the toric lens into its final resting place at 178 degrees.  0.1 ml of Vigamox  was placed in the anterior chamber. The eye was inflated to a physiologic pressure and found to be watertight.  The eye  was dressed with Combigan . The patient was given protective glasses to wear throughout the day and a shield with which to sleep tonight. The patient was also given drops with which to begin a drop regimen today and will follow-up with me in one day. Implant Name Type Inv. Item Serial No. Manufacturer Lot No. LRB No. Used Action  LENS IOL PANO PRO TORC 19.5 - [id number]  LENS IOL PANO PRO TORC 19.5 [id number] S[employer]  Left 1 Implanted   Procedure(s): PHACOEMULSIFICATION, CATARACT, WITH IOL INSERTION 10.47 01:08.1 (Left)  Electronically signed: Elsie Carmine 9/16/20259:14 AM

## 2024-02-08 DIAGNOSIS — H8111 Benign paroxysmal vertigo, right ear: Secondary | ICD-10-CM | POA: Diagnosis not present

## 2024-02-08 DIAGNOSIS — M542 Cervicalgia: Secondary | ICD-10-CM | POA: Diagnosis not present

## 2024-02-08 DIAGNOSIS — R293 Abnormal posture: Secondary | ICD-10-CM | POA: Diagnosis not present

## 2024-02-22 DIAGNOSIS — H8111 Benign paroxysmal vertigo, right ear: Secondary | ICD-10-CM | POA: Diagnosis not present

## 2024-02-22 DIAGNOSIS — R293 Abnormal posture: Secondary | ICD-10-CM | POA: Diagnosis not present

## 2024-02-22 DIAGNOSIS — M542 Cervicalgia: Secondary | ICD-10-CM | POA: Diagnosis not present

## 2024-03-03 DIAGNOSIS — H8111 Benign paroxysmal vertigo, right ear: Secondary | ICD-10-CM | POA: Diagnosis not present

## 2024-03-03 DIAGNOSIS — R293 Abnormal posture: Secondary | ICD-10-CM | POA: Diagnosis not present

## 2024-03-03 DIAGNOSIS — M542 Cervicalgia: Secondary | ICD-10-CM | POA: Diagnosis not present

## 2024-03-12 ENCOUNTER — Encounter: Payer: Self-pay | Admitting: Dermatology

## 2024-03-12 ENCOUNTER — Ambulatory Visit: Admitting: Dermatology

## 2024-03-12 VITALS — BP 129/79 | HR 62

## 2024-03-12 DIAGNOSIS — L578 Other skin changes due to chronic exposure to nonionizing radiation: Secondary | ICD-10-CM

## 2024-03-12 DIAGNOSIS — L821 Other seborrheic keratosis: Secondary | ICD-10-CM

## 2024-03-12 DIAGNOSIS — W908XXA Exposure to other nonionizing radiation, initial encounter: Secondary | ICD-10-CM | POA: Diagnosis not present

## 2024-03-12 DIAGNOSIS — D229 Melanocytic nevi, unspecified: Secondary | ICD-10-CM

## 2024-03-12 DIAGNOSIS — L905 Scar conditions and fibrosis of skin: Secondary | ICD-10-CM

## 2024-03-12 DIAGNOSIS — L570 Actinic keratosis: Secondary | ICD-10-CM

## 2024-03-12 DIAGNOSIS — L814 Other melanin hyperpigmentation: Secondary | ICD-10-CM | POA: Diagnosis not present

## 2024-03-12 DIAGNOSIS — Z85828 Personal history of other malignant neoplasm of skin: Secondary | ICD-10-CM

## 2024-03-12 DIAGNOSIS — D1801 Hemangioma of skin and subcutaneous tissue: Secondary | ICD-10-CM | POA: Diagnosis not present

## 2024-03-12 DIAGNOSIS — Z1283 Encounter for screening for malignant neoplasm of skin: Secondary | ICD-10-CM

## 2024-03-12 DIAGNOSIS — D485 Neoplasm of uncertain behavior of skin: Secondary | ICD-10-CM

## 2024-03-12 NOTE — Patient Instructions (Addendum)
 For areas treated with Liquid Nitrogen:  Keep clean with soap and water.  Apply Vaseline or Aquaphor twice daily.  Patient Handout: Wound Care for Skin Biopsy Site  Taking Care of Your Skin Biopsy Site  Proper care of the biopsy site is essential for promoting healing and minimizing scarring. This handout provides instructions on how to care for your biopsy site to ensure optimal recovery.  1. Cleaning the Wound:  Clean the biopsy site daily with gentle soap and water. Gently pat the area dry with a clean, soft towel. Avoid harsh scrubbing or rubbing the area, as this can irritate the skin and delay healing.  2. Applying Aquaphor and Bandage:  After cleaning the wound, apply a thin layer of Aquaphor ointment to the biopsy site. Cover the area with a sterile bandage to protect it from dirt, bacteria, and friction. Change the bandage daily or as needed if it becomes soiled or wet.  3. Continued Care for One Week:  Repeat the cleaning, Aquaphor application, and bandaging process daily for one week following the biopsy procedure. Keeping the wound clean and moist during this initial healing period will help prevent infection and promote optimal healing.  4. Massaging Aquaphor into the Area:  ---After one week, discontinue the use of bandages but continue to apply Aquaphor to the biopsy site. ----Gently massage the Aquaphor into the area using circular motions. ---Massaging the skin helps to promote circulation and prevent the formation of scar tissue.   Additional Tips:  Avoid exposing the biopsy site to direct sunlight during the healing process, as this can cause hyperpigmentation or worsen scarring. If you experience any signs of infection, such as increased redness, swelling, warmth, or drainage from the wound, contact your healthcare provider immediately. Follow any additional instructions provided by your healthcare provider for caring for the biopsy site and managing any  discomfort. Conclusion:  Taking proper care of your skin biopsy site is crucial for ensuring optimal healing and minimizing scarring. By following these instructions for cleaning, applying Aquaphor, and massaging the area, you can promote a smooth and successful recovery. If you have any questions or concerns about caring for your biopsy site, don't hesitate to contact your healthcare provider for guidance.   Important Information  Due to recent changes in healthcare laws, you may see results of your pathology and/or laboratory studies on MyChart before the doctors have had a chance to review them. We understand that in some cases there may be results that are confusing or concerning to you. Please understand that not all results are received at the same time and often the doctors may need to interpret multiple results in order to provide you with the best plan of care or course of treatment. Therefore, we ask that you please give us  2 business days to thoroughly review all your results before contacting the office for clarification. Should we see a critical lab result, you will be contacted sooner.   If You Need Anything After Your Visit  If you have any questions or concerns for your doctor, please call our main line at [phone] If no one answers, please leave a voicemail as directed and we will return your call as soon as possible. Messages left after 4 pm will be answered the following business day.   You may also send us  a message via MyChart. We typically respond to MyChart messages within 1-2 business days.  For prescription refills, please ask your pharmacy to contact our office. Our fax number is  [phone].  If you have an urgent issue when the clinic is closed that cannot wait until the next business day, you can page your doctor at the number below.    Please note that while we do our best to be available for urgent issues outside of office hours, we are not available 24/7.    If you have an urgent issue and are unable to reach us , you may choose to seek medical care at your doctor's office, retail clinic, urgent care center, or emergency room.  If you have a medical emergency, please immediately call 911 or go to the emergency department. In the event of inclement weather, please call our main line at [phone] for an update on the status of any delays or closures.  Dermatology Medication Tips: Please keep the boxes that topical medications come in in order to help keep track of the instructions about where and how to use these. Pharmacies typically print the medication instructions only on the boxes and not directly on the medication tubes.   If your medication is too expensive, please contact our office at [phone] or send us  a message through MyChart.   We are unable to tell what your co-pay for medications will be in advance as this is different depending on your insurance coverage. However, we may be able to find a substitute medication at lower cost or fill out paperwork to get insurance to cover a needed medication.   If a prior authorization is required to get your medication covered by your insurance company, please allow us  1-2 business days to complete this process.  Drug prices often vary depending on where the prescription is filled and some pharmacies may offer cheaper prices.  The website www.goodrx.com contains coupons for medications through different pharmacies. The prices here do not account for what the cost may be with help from insurance (it may be cheaper with your insurance), but the website can give you the price if you did not use any insurance.  - You can print the associated coupon and take it with your prescription to the pharmacy.  - You may also stop by our office during regular business hours and pick up a GoodRx coupon card.  - If you need your prescription sent electronically to a different pharmacy, notify our office  through [facility] or by phone at [phone]    Skin Education :   I counseled the patient regarding the following: Sun screen (SPF 30 or greater) should be applied during peak UV exposure (between 10am and 2pm) and reapplied after exercise or swimming.  The ABCDEs of melanoma were reviewed with the patient, and the importance of monthly self-examination of moles was emphasized. Should any moles change in shape or color, or itch, bleed or burn, pt will contact our office for evaluation sooner then their interval appointment.  Plan: Sunscreen Recommendations I recommended a broad spectrum sunscreen with a SPF of 30 or higher. I explained that SPF 30 sunscreens block approximately 97 percent of the sun's harmful rays. Sunscreens should be applied at least 15 minutes prior to expected sun exposure and then every 2 hours after that as long as sun exposure continues. If swimming or exercising sunscreen should be reapplied every 45 minutes to an hour after getting wet or sweating. One ounce, or the equivalent of a shot glass full of sunscreen, is adequate to protect the skin not covered by a bathing suit. I also recommended a lip balm with a sunscreen as  well. Sun protective clothing can be used in lieu of sunscreen but must be worn the entire time you are exposed to the sun's rays.

## 2024-03-12 NOTE — Progress Notes (Signed)
 Follow-Up Visit   Subjective  David Mcgee is a 78 y.o. male who presents for the following: Skin Cancer Screening and Full Body Skin Exam  The patient presents for Total-Body Skin Exam (TBSE) for skin cancer screening and mole check. The patient has spots, moles and lesions to be evaluated, some may be new or changing.  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  The following portions of the chart were reviewed this encounter and updated as appropriate: medications, allergies, medical history  Review of Systems:  No other skin or systemic complaints except as noted in HPI or Assessment and Plan.  Objective  Well appearing patient in no apparent distress; mood and affect are within normal limits.  A full examination was performed including scalp, head, eyes, ears, nose, lips, neck, chest, axillae, abdomen, back, buttocks, bilateral upper extremities, bilateral lower extremities, hands, feet, fingers, toes, fingernails, and toenails. All findings within normal limits unless otherwise noted below.   Relevant physical exam findings are noted in the Assessment and Plan.  Right Shoulder - Posterior 1cm pink scaly plaque  Left Shoulder - Anterior 1.1 pink scaly plaque  Left Hand - Posterior, Left Shoulder - Anterior, Right Buccal Cheek, Right Hand - Posterior, Right Zygomatic Area Erythematous thin papules/macules with gritty scale.   Assessment & Plan   SKIN CANCER SCREENING PERFORMED TODAY.  ACTINIC DAMAGE - Chronic condition, secondary to cumulative UV/sun exposure - diffuse scaly erythematous macules with underlying dyspigmentation - Recommend daily broad spectrum sunscreen SPF 30+ to sun-exposed areas, reapply every 2 hours as needed.  - Staying in the shade or wearing long sleeves, sun glasses (UVA+UVB protection) and wide brim hats (4-inch brim around the entire circumference of the hat) are also recommended for sun protection.  - Call for new or changing  lesions.  LENTIGINES, SEBORRHEIC KERATOSES, HEMANGIOMAS - Benign normal skin lesions - Benign-appearing - Call for any changes  MELANOCYTIC NEVI - Tan-brown and/or pink-flesh-colored symmetric macules and papules - Benign appearing on exam today - Observation - Call clinic for new or changing moles - Recommend daily use of broad spectrum spf 30+ sunscreen to sun-exposed areas.   HISTORY OF BASAL CELL CARCINOMA OF THE SKIN - No evidence of recurrence today - Recommend regular full body skin exams - Recommend daily broad spectrum sunscreen SPF 30+ to sun-exposed areas, reapply every 2 hours as needed.  - Call if any new or changing lesions are noted between office visits   NEOPLASM OF UNCERTAIN BEHAVIOR OF SKIN (2) Right Shoulder - Posterior Skin / nail biopsy Type of biopsy: tangential   Informed consent: discussed and consent obtained   Timeout: patient name, date of birth, surgical site, and procedure verified   Procedure prep:  Patient was prepped and draped in usual sterile fashion Prep type:  Isopropyl alcohol Anesthesia: the lesion was anesthetized in a standard fashion   Anesthetic:  1% lidocaine  w/ epinephrine  1-100,000 buffered w/ 8.4% NaHCO3 Instrument used: DermaBlade   Hemostasis achieved with: aluminum chloride   Outcome: patient tolerated procedure well   Post-procedure details: sterile dressing applied and wound care instructions given   Dressing type: petrolatum gauze and bandage    Specimen 1 - Surgical pathology Differential Diagnosis: r/o nmsc vs other  Check Margins: No Left Shoulder - Anterior Skin / nail biopsy Type of biopsy: tangential   Informed consent: discussed and consent obtained   Timeout: patient name, date of birth, surgical site, and procedure verified   Procedure prep:  Patient was prepped and draped in usual sterile fashion Prep type:  Isopropyl alcohol Anesthesia: the lesion was anesthetized in a standard fashion   Anesthetic:  1%  lidocaine  w/ epinephrine  1-100,000 buffered w/ 8.4% NaHCO3 Instrument used: DermaBlade   Hemostasis achieved with: aluminum chloride   Outcome: patient tolerated procedure well   Post-procedure details: sterile dressing applied and wound care instructions given   Dressing type: petrolatum gauze and bandage    Specimen 2 - Surgical pathology Differential Diagnosis: r/o nmsc vs other  Check Margins: No AK (ACTINIC KERATOSIS) (5) Left Hand - Posterior, Left Shoulder - Anterior, Right Buccal Cheek, Right Hand - Posterior, Right Zygomatic Area Destruction of lesion - Left Hand - Posterior, Left Shoulder - Anterior, Right Buccal Cheek, Right Hand - Posterior, Right Zygomatic Area Complexity: simple   Destruction method: cryotherapy   Informed consent: discussed and consent obtained   Timeout:  patient name, date of birth, surgical site, and procedure verified Lesion destroyed using liquid nitrogen: Yes   Region frozen until ice ball extended beyond lesion: Yes   Cryotherapy cycles:  2 Outcome: patient tolerated procedure well with no complications   Post-procedure details: wound care instructions given    ACTINIC SKIN DAMAGE   LENTIGINES   MULTIPLE BENIGN NEVI   SEBORRHEIC KERATOSES   CHERRY ANGIOMA   SCAR   HISTORY OF BASAL CELL CANCER   Return in about 6 months (around 09/10/2024) for TBSC.  I, Berwyn Lesches, Surg Tech III, am acting as scribe for RUFUS CHRISTELLA HOLY, MD.   Documentation: I have reviewed the above documentation for accuracy and completeness, and I agree with the above.  RUFUS CHRISTELLA HOLY, MD

## 2024-03-14 LAB — SURGICAL PATHOLOGY

## 2024-03-17 ENCOUNTER — Ambulatory Visit: Payer: Self-pay | Admitting: Dermatology

## 2024-03-17 DIAGNOSIS — R262 Difficulty in walking, not elsewhere classified: Secondary | ICD-10-CM | POA: Diagnosis not present

## 2024-03-17 DIAGNOSIS — M79671 Pain in right foot: Secondary | ICD-10-CM | POA: Diagnosis not present

## 2024-03-17 DIAGNOSIS — M722 Plantar fascial fibromatosis: Secondary | ICD-10-CM | POA: Diagnosis not present

## 2024-03-19 ENCOUNTER — Ambulatory Visit: Payer: Self-pay

## 2024-03-19 NOTE — Telephone Encounter (Signed)
 Noted

## 2024-03-19 NOTE — Telephone Encounter (Signed)
 FYI Only or Action Required?: FYI only for provider: appointment scheduled on 10/30.  Patient was last seen in primary care on 12/07/2023 by Cleatus Arlyss RAMAN, MD.  Called Nurse Triage reporting Hip Pain.  Symptoms began a week ago.  Interventions attempted: OTC medications: Tylenol  and Rest, hydration, or home remedies.  Symptoms are: gradually worsening.  Triage Disposition: See PCP When Office is Open (Within 3 Days)  Patient/caregiver understands and will follow disposition?:   Copied from CRM #[id number]. Topic: Clinical - Red Word Triage >> Mar 19, 2024  9:27 AM Adelita E wrote: [facility] that prompted transfer to Nurse Triage: Severe pain in right hip / groin area, mildly radiating to the left side. Pain going on for 1 week. Reason for Disposition  [1] MODERATE pain (e.g., interferes with normal activities, limping) AND [2] present > 3 days  Answer Assessment - Initial Assessment Questions Off and on for the last week patient has been experiencing right hip and groin pain. Occasionally he is feeling it in his left hip groin. Denies injuring the area. Denies swelling, redness, fever, or any hernia like outpouches.  Pt with hx of lumbar cyst that presented with similar symptoms a few years ago. The pain will overtake him when he is walking and has to stop and rest till it eases.  Hurts to walks, to lift his leg, to get from sitting to standing Feels like a pulled muscle almost but not resolving. Tylenol - eased the pain mildly. Sees PT for his neck and they did a series of mobility tests and thought arthritis but wants to r/o hernia. Appt made with PCP office to assess. ED/UC/Call back precautions given and understood.  1. LOCATION and RADIATION: Where is the pain located? Does the pain spread (shoot) anywhere else?     Right hip groin area. And slightly in the left  2. QUALITY: What does the pain feel like?  (e.g., sharp, dull, aching, burning)     Stabbing burning - denies  radiating down the legs  3. SEVERITY: How bad is the pain? What does it keep you from doing?   (Scale 1-10; or mild, moderate, severe)     2/10 when laying down resting, 9-10/10 pain with being up and walking.  4. ONSET: When did the pain start? Does it come and go, or is it there all the time?     Last week off and on  5. WORK OR EXERCISE: Has there been any recent work or exercise that involved this part of the body?      Denies anything abnormal  6. CAUSE: What do you think is causing the hip pain?      Possibility of recurrent cyst in spine 7. AGGRAVATING FACTORS: What makes the hip pain worse? (e.g., walking, climbing stairs, running)     Walking- was at the beach for a week and it seems to be worse since being back  8. OTHER SYMPTOMS: Do you have any other symptoms? (e.g., back pain, pain shooting down leg,  fever, rash)     Denies  Protocols used: Hip Pain-A-AH

## 2024-03-20 ENCOUNTER — Encounter: Payer: Self-pay | Admitting: Family Medicine

## 2024-03-20 ENCOUNTER — Ambulatory Visit: Admitting: Family Medicine

## 2024-03-20 ENCOUNTER — Ambulatory Visit (INDEPENDENT_AMBULATORY_CARE_PROVIDER_SITE_OTHER)
Admission: RE | Admit: 2024-03-20 | Discharge: 2024-03-20 | Disposition: A | Discharge status: Home / Self Care | Source: Ambulatory Visit | Attending: Family Medicine | Admitting: Family Medicine

## 2024-03-20 VITALS — BP 120/70 | HR 65 | Temp 98.1°F | Ht 74.5 in | Wt 245.4 lb

## 2024-03-20 DIAGNOSIS — M25551 Pain in right hip: Secondary | ICD-10-CM

## 2024-03-20 DIAGNOSIS — M160 Bilateral primary osteoarthritis of hip: Secondary | ICD-10-CM | POA: Diagnosis not present

## 2024-03-20 DIAGNOSIS — G8929 Other chronic pain: Secondary | ICD-10-CM | POA: Diagnosis not present

## 2024-03-20 MED ORDER — PREDNISONE 20 MG PO TABS: ORAL_TABLET | ORAL | 0 refills | Status: DC

## 2024-03-20 NOTE — Progress Notes (Signed)
 David Umeda T. Jayme Mednick, MD, CAQ Sports Medicine [facility] at [facility] [address] [place], [address]  Phone: [phone]  FAX: [phone]  David Mcgee - 78 y.o. male  MRN [id number]  Date of Birth: [birth year]  Date: 03/20/2024  PCP: Cleatus Arlyss RAMAN, MD  Referral: Cleatus Arlyss RAMAN, MD  Chief Complaint  Patient presents with   Hip Pain    Right   Subjective:   David Mcgee is a 78 y.o. very pleasant male patient with Body mass index is 31.08 kg/m. who presents with the following:  Discussed the use of AI scribe software for clinical note transcription with the patient, who gave verbal consent to proceed.  History lumbar laminectomy 2014 History of Present Illness David Mcgee is a 78 year old male who presents with severe groin pain.  He has been experiencing severe groin pain for the past two to three months, with significant worsening over the past week. The pain is exacerbated by sitting, standing, walking, and lying in bed. He describes difficulty with daily activities such as putting on pants and getting in and out of his truck, where he has to drag his right leg due to pain. He has been limping, and describes being cautious with his movements due to pain.  He has been using Tylenol  for pain relief and has been resting for the past week, but the pain persists. No numbness, tingling, or pain radiating down the leg. There is no pain in the back, and he has not noticed any bulge or discomfort in the testicular area.  His past medical history includes knee replacements, with one knee replaced six years ago and the other nine years ago. He also attends therapy every other week for neck issues, which he attributes to arthritis.    Review of Systems is noted in the HPI, as appropriate  Objective:   BP 120/70   Pulse 65   Temp 98.1 F (36.7 C) (Temporal)   Ht 6' 2.5 (1.892 m)   Wt 245 lb 6 oz (111.3 kg)   SpO2 97%   BMI 31.08 kg/m    GEN: No acute distress; alert,appropriate. PULM: Breathing comfortably in no respiratory distress PSYCH: Normally interactive.    HIP EXAM: SIDE: Right ROM: Abduction, Flexion, Internal and External range of motion: Significant limitation in abduction and with the hip flexed to 90 degrees he has roughly 17 degrees of total rotational movement Pain with terminal IROM and EROM: Yes GTB: NT SLR: NEG Knees: No effusion FABER: NT REVERSE FABER: NT, neg Piriformis: NT at direct palpation Str: flexion: 4/5 abduction: 5/5 adduction: 5/5 Strength testing non-tender    Laboratory and Imaging Data:  Assessment and Plan:     ICD-10-CM   1. Chronic right hip pain  M25.551 DG Hip Unilat W OR W/O Pelvis 2-3 Views Right   G89.29      Assessment & Plan Right hip osteoarthritis, radiographically, this looks fairly mild, but clinically consistent with anterior groin pain and hip osteoarthritis Chronic right hip pain with mild arthritic changes on X-ray. Hernia unlikely. - Prescribed oral prednisone  for 7-10 days. - Consider hip injection under x-ray guidance if symptoms persist. - Sent prednisone  prescription to [facility].  Medication Management during today's office visit: Meds ordered this encounter  Medications   predniSONE  (DELTASONE ) 20 MG tablet    Sig: 2 tabs po daily for 5 days, then 1 tab po daily for 5 days    Dispense:  15 tablet    Refill:  0   Medications Discontinued During This Encounter  Medication Reason   Melatonin 5 MG TABS Patient Preference    Orders placed today for conditions managed today: Orders Placed This Encounter  Procedures   DG Hip Unilat W OR W/O Pelvis 2-3 Views Right    Disposition: No follow-ups on file.  [employer] speech-to-text software was used for transcription in this dictation.  Possible transcriptional errors can occur using Animal nutritionist.   Signed,  Jacques DASEN. Sharyon Peitz, MD   Outpatient Encounter Medications as of  03/20/2024  Medication Sig   amLODipine  (NORVASC ) 10 MG tablet TAKE 1 TABLET BY MOUTH EVERYDAY AT BEDTIME   ammonium lactate (AMLACTIN) 12 % cream Apply 1 g topically as needed (itchy skin).   aspirin  EC 81 MG tablet Take 81 mg by mouth daily.   atorvastatin  (LIPITOR) 10 MG tablet Take 10 mg by mouth daily.   Carboxymethylcellulose Sodium (THERATEARS OP) Apply 1 drop to eye daily as needed (dry eyes).   cholecalciferol (VITAMIN D3) 25 MCG (1000 UT) tablet Take 1,000 Units by mouth daily.   docusate sodium  (COLACE) 100 MG capsule Take 200 mg by mouth 2 (two) times daily as needed for mild constipation.   hydrocortisone  (ANUSOL -HC) 2.5 % rectal cream Place 1 application. rectally daily as needed for hemorrhoids or anal itching.   hydrocortisone  2.5 % ointment Apply topically 2 (two) times daily.   levothyroxine (SYNTHROID) 25 MCG tablet Take 25 mcg by mouth daily before breakfast.   lisinopril  (PRINIVIL ,ZESTRIL ) 5 MG tablet Take 2.5 mg by mouth daily.   Multiple Vitamin (MULTIVITAMIN) tablet Take 1 tablet by mouth daily.   Omega-3 Fatty Acids (FISH OIL) 1000 MG CAPS Take 1,000 mg by mouth 2 (two) times daily.    omeprazole  (PRILOSEC) 20 MG capsule Take 1 capsule (20 mg total) by mouth daily.   predniSONE  (DELTASONE ) 20 MG tablet 2 tabs po daily for 5 days, then 1 tab po daily for 5 days   sertraline  (ZOLOFT ) 100 MG tablet Take 100 mg by mouth daily.   tamsulosin  (FLOMAX ) 0.4 MG CAPS capsule Take 0.4 mg by mouth at bedtime.    triamcinolone  (KENALOG ) 0.025 % ointment Apply 1 Application topically 2 (two) times daily.   [DISCONTINUED] Melatonin 5 MG TABS Take 5 mg by mouth at bedtime as needed. (Patient not taking: Reported on 01/14/2024)   No facility-administered encounter medications on file as of 03/20/2024.

## 2024-03-21 ENCOUNTER — Ambulatory Visit: Payer: Self-pay | Admitting: Family Medicine

## 2024-03-24 DIAGNOSIS — H8111 Benign paroxysmal vertigo, right ear: Secondary | ICD-10-CM | POA: Diagnosis not present

## 2024-03-24 DIAGNOSIS — M542 Cervicalgia: Secondary | ICD-10-CM | POA: Diagnosis not present

## 2024-03-24 DIAGNOSIS — R293 Abnormal posture: Secondary | ICD-10-CM | POA: Diagnosis not present

## 2024-04-03 ENCOUNTER — Encounter: Payer: Self-pay | Admitting: Family Medicine

## 2024-04-03 ENCOUNTER — Ambulatory Visit: Admitting: Family Medicine

## 2024-04-03 VITALS — BP 122/78 | HR 63 | Temp 97.8°F | Ht 74.5 in | Wt 239.1 lb

## 2024-04-03 DIAGNOSIS — M25559 Pain in unspecified hip: Secondary | ICD-10-CM | POA: Diagnosis not present

## 2024-04-03 DIAGNOSIS — R300 Dysuria: Secondary | ICD-10-CM | POA: Diagnosis not present

## 2024-04-03 DIAGNOSIS — R319 Hematuria, unspecified: Secondary | ICD-10-CM

## 2024-04-03 DIAGNOSIS — R8290 Unspecified abnormal findings in urine: Secondary | ICD-10-CM | POA: Diagnosis not present

## 2024-04-03 LAB — POC URINALSYSI DIPSTICK (AUTOMATED)
Bilirubin, UA: NEGATIVE
Blood, UA: NEGATIVE
Glucose, UA: NEGATIVE
Ketones, UA: NEGATIVE
Nitrite, UA: NEGATIVE
Protein, UA: NEGATIVE
Spec Grav, UA: 1.015 (ref 1.010–1.025)
Urobilinogen, UA: 0.2 U/dL
pH, UA: 6 (ref 5.0–8.0)

## 2024-04-03 MED ORDER — SULFAMETHOXAZOLE-TRIMETHOPRIM 800-160 MG PO TABS
1.0000 | ORAL_TABLET | Freq: Two times a day (BID) | ORAL | 0 refills | Status: AC
Start: 2024-04-03 — End: ?

## 2024-04-03 MED ORDER — PREDNISONE 10 MG PO TABS: ORAL_TABLET | ORAL | 0 refills | Status: AC

## 2024-04-03 NOTE — Patient Instructions (Signed)
 Drink plenty of water and start the antibiotics today.  We'll contact you with your lab report.  If you have more hip pain, then try the lower dose of prednisone  with food.  Take care.  Glad to see you.

## 2024-04-03 NOTE — Progress Notes (Signed)
 Prev OV d/w pt, prev xray reviewed.    No temps above 100.  No burning with urination.    R groin pain, more with leaning over.  Nausea episodically.  Prednisone  helped the pain prev.  More pain returned with prednisone  completion.    He has noted some brownish spotting overnight.  He can still hold his urine w/o leaking.  No red blood in urine. He hasn't felt a bulge in the groin.   He had some BRBPR from hemorrhoid with a large BM.  No black stools.    He had higher appetite while on prednisone .    Meds, vitals, and allergies reviewed.   ROS: Per HPI unless specifically indicated in ROS section   Nad ncat Rrr Ctab No midline back pain.  No cva pain.  L lower back sore.  Pain with R hip flexion.  No pain on L hip ROM.  Abd soft, not ttp No inguinal hernia B.  No testicle pain B.  Normal ext genitalia.

## 2024-04-04 LAB — URINE CULTURE
MICRO NUMBER:: 17231370
SPECIMEN QUALITY:: ADEQUATE

## 2024-04-05 DIAGNOSIS — R319 Hematuria, unspecified: Secondary | ICD-10-CM | POA: Insufficient documentation

## 2024-04-05 DIAGNOSIS — R8290 Unspecified abnormal findings in urine: Secondary | ICD-10-CM | POA: Insufficient documentation

## 2024-04-05 DIAGNOSIS — M25559 Pain in unspecified hip: Secondary | ICD-10-CM | POA: Insufficient documentation

## 2024-04-05 NOTE — Assessment & Plan Note (Signed)
 Concern for cystitis.  Drink plenty of water and start septra  today.  We'll contact pt with lab report.  See notes on labs.

## 2024-04-05 NOTE — Assessment & Plan Note (Signed)
 Likely with mechanical symptoms.  D/w pt.  If more hip pain, then try the lower dose of prednisone  with food. He can update me as needed.

## 2024-04-06 ENCOUNTER — Ambulatory Visit: Payer: Self-pay | Admitting: Family Medicine

## 2024-04-09 DIAGNOSIS — H8111 Benign paroxysmal vertigo, right ear: Secondary | ICD-10-CM | POA: Diagnosis not present

## 2024-04-09 DIAGNOSIS — M542 Cervicalgia: Secondary | ICD-10-CM | POA: Diagnosis not present

## 2024-04-09 DIAGNOSIS — M79671 Pain in right foot: Secondary | ICD-10-CM | POA: Diagnosis not present

## 2024-04-09 DIAGNOSIS — M722 Plantar fascial fibromatosis: Secondary | ICD-10-CM | POA: Diagnosis not present

## 2024-04-09 DIAGNOSIS — R262 Difficulty in walking, not elsewhere classified: Secondary | ICD-10-CM | POA: Diagnosis not present

## 2024-04-09 DIAGNOSIS — R293 Abnormal posture: Secondary | ICD-10-CM | POA: Diagnosis not present

## 2024-04-14 DIAGNOSIS — M722 Plantar fascial fibromatosis: Secondary | ICD-10-CM | POA: Diagnosis not present

## 2024-04-14 DIAGNOSIS — R262 Difficulty in walking, not elsewhere classified: Secondary | ICD-10-CM | POA: Diagnosis not present

## 2024-04-14 DIAGNOSIS — M79671 Pain in right foot: Secondary | ICD-10-CM | POA: Diagnosis not present

## 2024-05-07 ENCOUNTER — Ambulatory Visit: Admitting: Dermatology

## 2024-05-07 ENCOUNTER — Encounter: Payer: Self-pay | Admitting: Dermatology

## 2024-05-07 VITALS — BP 129/77 | HR 71

## 2024-05-07 DIAGNOSIS — Z85828 Personal history of other malignant neoplasm of skin: Secondary | ICD-10-CM | POA: Diagnosis not present

## 2024-05-07 DIAGNOSIS — L570 Actinic keratosis: Secondary | ICD-10-CM | POA: Diagnosis not present

## 2024-05-07 NOTE — Patient Instructions (Addendum)

## 2024-05-07 NOTE — Progress Notes (Signed)
° °  Follow-Up Visit   Subjective  David Mcgee is a 78 y.o. male who presents for the following: AK Treatment  Patient present today for cryotherapy. Patient was last evaluated on 03/12/24. At this visit patient had biopsy performed on the right shoulder - posterior and left shoulder- anterior both resulted as actinic keratosis.   He had a biopsy of several precancerous skin lesions. The lesions are located on his right shoulder. He is compliant with sunscreen use, although he uses it less frequently in the winter months as he is not outside as much.  He has a follow-up skin check scheduled for September 15, 2024. He mentioned the possibility of riding his bike to the appointment, indicating he lives within a hundred miles of the clinic. He uses heated gear for biking in cold weather, including a heated mask, gloves, grips, and seat.  The following portions of the chart were reviewed this encounter and updated as appropriate: medications, allergies, medical history  Review of Systems:  No other skin or systemic complaints except as noted in HPI or Assessment and Plan.  Objective  Well appearing patient in no apparent distress; mood and affect are within normal limits.  A focused examination was performed of the following areas: Left and right shoulder  Relevant exam findings are noted in the Assessment and Plan.  Left Shoulder - Anterior, Right Shoulder - Posterior Erythematous thin papules/macules with gritty scale.   Assessment & Plan    HISTORY OF BASAL CELL CARCINOMA OF THE SKIN - No evidence of recurrence today - Recommend regular full body skin exams - Recommend daily broad spectrum sunscreen SPF 30+ to sun-exposed areas, reapply every 2 hours as needed.  - Call if any new or changing lesions are noted between office visits  AK (ACTINIC KERATOSIS) (2) Left Shoulder - Anterior, Right Shoulder - Posterior - Destruction of lesion - Left Shoulder - Anterior, Right Shoulder -  Posterior Complexity: simple   Destruction method: cryotherapy   Timeout:  patient name, date of birth, surgical site, and procedure verified Lesion destroyed using liquid nitrogen: Yes   Region frozen until ice ball extended beyond lesion: Yes   Outcome: patient tolerated procedure well with no complications   Post-procedure details: wound care instructions given     Return for next FBSE.  LILLETTE Rollene Gobble, RN, am acting as scribe for RUFUS CHRISTELLA HOLY, MD .   Documentation: I have reviewed the above documentation for accuracy and completeness, and I agree with the above.  RUFUS CHRISTELLA HOLY, MD

## 2024-06-04 ENCOUNTER — Ambulatory Visit: Payer: Self-pay

## 2024-06-04 NOTE — Telephone Encounter (Signed)
 I am not in clinic today.  My advice is to get him scheduled asap with anyone who has an opening in clinic, if I don't have an open slot in the near future.

## 2024-06-04 NOTE — Telephone Encounter (Signed)
 UTI with hematuria approx 1 month ago, took abx, improved. Last few weeks has noticed creamy discharge and lower tract pain. Pt refusing to see another provider, please call him back if you can schedule him ASAP with Dr. Cleatus.   FYI Only or Action Required?: Action required by provider: request for appointment.  Patient was last seen in primary care on 04/03/2024 by Cleatus Arlyss RAMAN, MD.  Called Nurse Triage reporting Recurrent UTI.  Symptoms began several weeks ago.  Interventions attempted: Nothing.  Symptoms are: unchanged.  Triage Disposition: See PCP Within 2 Weeks  Patient/caregiver understands and will follow disposition?: Yes   Copied from CRM [id number]. Topic: Clinical - Red Word Triage >> Jun 04, 2024  9:35 AM Larissa RAMAN wrote: Red Word that prompted transfer to Nurse Triage: abdominal pain, discharge from penis with pain- worsening Reason for Disposition  All other urine symptoms  Answer Assessment - Initial Assessment Questions 1. SYMPTOM: What's the main symptom you're concerned about? (e.g., frequency, incontinence)     Discharge not blood, creamy color discharge, uneasy feeling in my lower tract  2. ONSET: When did the  symptoms  start?     Couple weeks  3. PAIN: Is there any pain? If Yes, ask: How bad is it? (Scale: 1-10; mild, moderate, severe)     Yes like a gut punch in my urinary tract  4. CAUSE: What do you think is causing the symptoms?     UTI  5. OTHER SYMPTOMS: Do you have any other symptoms? (e.g., blood in urine, fever, flank pain, pain with urination)     Denies  Protocols used: Urinary Symptoms-A-AH

## 2024-06-05 NOTE — Telephone Encounter (Signed)
 Noted. Thanks.

## 2024-06-05 NOTE — Telephone Encounter (Signed)
 Pt notified as instructed and pt voiced understanding. Pt said he does not need to go to UC or ED; pt has had symptoms for several days. Pt said had creamy brown penile discharge but does not have abd pain or pain upon urination. Pt scheduled appt with Dr Rilla on 06/06/24 at 2:30 with UC & ED precautions and pt voiced understanding. Sending FYI to Dr Cleatus and Dr Anton Rilla.

## 2024-06-06 ENCOUNTER — Encounter: Payer: Self-pay | Admitting: Family Medicine

## 2024-06-06 ENCOUNTER — Ambulatory Visit: Admitting: Family Medicine

## 2024-06-06 VITALS — BP 120/64 | HR 73 | Temp 97.9°F | Ht 74.5 in | Wt 242.4 lb

## 2024-06-06 DIAGNOSIS — R300 Dysuria: Secondary | ICD-10-CM | POA: Diagnosis not present

## 2024-06-06 DIAGNOSIS — R369 Urethral discharge, unspecified: Secondary | ICD-10-CM

## 2024-06-06 LAB — POC URINALSYSI DIPSTICK (AUTOMATED)
Bilirubin, UA: NEGATIVE
Blood, UA: NEGATIVE
Glucose, UA: NEGATIVE
Ketones, UA: NEGATIVE
Nitrite, UA: NEGATIVE
Protein, UA: POSITIVE — AB
Spec Grav, UA: >=1.03 — AB
Urobilinogen, UA: 0.2 U/dL
pH, UA: 6

## 2024-06-06 MED ORDER — SULFAMETHOXAZOLE-TRIMETHOPRIM 800-160 MG PO TABS: 1.0000 | ORAL_TABLET | Freq: Two times a day (BID) | ORAL | 0 refills | Status: AC

## 2024-06-06 NOTE — Progress Notes (Signed)
 " Ph: [phone] Fax: [phone]   Patient ID: David Mcgee, male    DOB: 05-25-1945, 79 y.o.   MRN: [id number]  This visit was conducted in person.  BP 120/64 (BP Location: Left Arm, Patient Position: Sitting, Cuff Size: Large)   Pulse 73   Temp 97.9 F (36.6 C) (Oral)   Ht 6' 2.5 (1.892 m)   Wt 242 lb 6.4 oz (110 kg)   SpO2 94%   BMI 30.71 kg/m   BP Readings from Last 3 Encounters:  06/06/24 120/64  05/07/24 129/77  04/03/24 122/78    CC: possible recurrent UTI  Subjective:   HPI: David Mcgee is a 79 y.o. male presenting on 06/06/2024 for Acute Visit (Burning while urinating, abnormal light brown penile discharge, has intermittent sick feeling on stomach and just doesn't feel right, pain in lower pubic area, /Was treated for UTI, finished all treatment but this new flare came about 3 weeks ago/)   Dysuria 11/2023 - abnormal UA, UCx <10k GNR, no abx.  Treated for UTI 04/06/2024 with bactrim  ds 7d course with resolution of symptoms. Abnormal UA, UCx at that time growing <10k GNR.   3 wks ago developed recurrent urethral discharge creamy brownish associated with nausea, malaise, dysuria after stream ends, frequency,  No fevers/chills, abd pain, vomiting, flank pain or urinary urgency or hematuria, no rectal pain.   H/o BPH managed with flomax , he normally voids 2-3 times at night time, no new weakening of stream, hesitancy or dribbling.   He's been drinking more cranberry juice.  Lives with wife.  No new sexual contacts.      Relevant past medical, surgical, family and social history reviewed and updated as indicated. Interim medical history since our last visit reviewed. Allergies and medications reviewed and updated. Outpatient Medications Prior to Visit  Medication Sig Dispense Refill   amLODipine  (NORVASC ) 10 MG tablet TAKE 1 TABLET BY MOUTH EVERYDAY AT BEDTIME 90 tablet 0   ammonium lactate (AMLACTIN) 12 % cream Apply 1 g topically as needed (itchy skin).      aspirin  EC 81 MG tablet Take 81 mg by mouth daily.     atorvastatin  (LIPITOR) 10 MG tablet Take 10 mg by mouth daily.     Carboxymethylcellulose Sodium (THERATEARS OP) Apply 1 drop to eye daily as needed (dry eyes).     cholecalciferol (VITAMIN D3) 25 MCG (1000 UT) tablet Take 1,000 Units by mouth daily.     docusate sodium  (COLACE) 100 MG capsule Take 200 mg by mouth 2 (two) times daily as needed for mild constipation.     hydrocortisone  (ANUSOL -HC) 2.5 % rectal cream Place 1 application. rectally daily as needed for hemorrhoids or anal itching.     hydrocortisone  2.5 % ointment Apply topically 2 (two) times daily. 30 g 0   levothyroxine (SYNTHROID) 25 MCG tablet Take 25 mcg by mouth daily before breakfast.     lisinopril  (PRINIVIL ,ZESTRIL ) 5 MG tablet Take 2.5 mg by mouth daily.     Multiple Vitamin (MULTIVITAMIN) tablet Take 1 tablet by mouth daily.     Omega-3 Fatty Acids (FISH OIL) 1000 MG CAPS Take 1,000 mg by mouth 2 (two) times daily.      omeprazole  (PRILOSEC) 20 MG capsule Take 1 capsule (20 mg total) by mouth daily.     sertraline  (ZOLOFT ) 100 MG tablet Take 100 mg by mouth daily.     tamsulosin  (FLOMAX ) 0.4 MG CAPS capsule Take 0.4 mg by mouth at bedtime.  triamcinolone  (KENALOG ) 0.025 % ointment Apply 1 Application topically 2 (two) times daily. 80 g 1   predniSONE  (DELTASONE ) 10 MG tablet Take 2 a day for 5 days, then 1 a day for 5 days, with food. Don't take with aleve/ibuprofen. (Patient not taking: Reported on 06/06/2024) 15 tablet 0   No facility-administered medications prior to visit.     Per HPI unless specifically indicated in ROS section below Review of Systems  Objective:  BP 120/64 (BP Location: Left Arm, Patient Position: Sitting, Cuff Size: Large)   Pulse 73   Temp 97.9 F (36.6 C) (Oral)   Ht 6' 2.5 (1.892 m)   Wt 242 lb 6.4 oz (110 kg)   SpO2 94%   BMI 30.71 kg/m   Wt Readings from Last 3 Encounters:  06/06/24 242 lb 6.4 oz (110 kg)  04/03/24 239  lb 2 oz (108.5 kg)  03/20/24 245 lb 6 oz (111.3 kg)      Physical Exam Vitals and nursing note reviewed.  Constitutional:      Appearance: Normal appearance. He is not ill-appearing.  HENT:     Head: Normocephalic and atraumatic.  Eyes:     Extraocular Movements: Extraocular movements intact.     Conjunctiva/sclera: Conjunctivae normal.     Pupils: Pupils are equal, round, and reactive to light.  Cardiovascular:     Rate and Rhythm: Normal rate and regular rhythm.     Pulses: Normal pulses.     Heart sounds: Normal heart sounds. No murmur heard. Pulmonary:     Effort: Pulmonary effort is normal. No respiratory distress.     Breath sounds: Normal breath sounds. No wheezing, rhonchi or rales.  Abdominal:     General: Bowel sounds are normal. There is no distension.     Palpations: Abdomen is soft. There is no mass.     Tenderness: There is no abdominal tenderness. There is no right CVA tenderness, left CVA tenderness, guarding or rebound.     Hernia: No hernia is present.  Genitourinary:    Prostate: Tender (mild). Not enlarged and no nodules present.     Rectum: Normal. No mass, tenderness, anal fissure, external hemorrhoid or internal hemorrhoid. Normal anal tone.     Comments: No significant prostatic enlargement noted Musculoskeletal:     Right lower leg: No edema.     Left lower leg: No edema.  Skin:    General: Skin is warm and dry.     Findings: No rash.  Neurological:     Mental Status: He is alert.  Psychiatric:        Mood and Affect: Mood normal.        Behavior: Behavior normal.       Results for orders placed or performed in visit on 06/06/24  POCT Urinalysis Dipstick (Automated)   Collection Time: 06/06/24  3:04 PM  Result Value Ref Range   Color, UA amber    Clarity, UA clear    Glucose, UA Negative Negative   Bilirubin, UA Negative    Ketones, UA Negative    Spec Grav, UA >=1.030 (A) 1.010 - 1.025   Blood, UA Negative    pH, UA 6.0 5.0 - 8.0    Protein, UA Positive (A) Negative   Urobilinogen, UA 0.2 0.2 or 1.0 E.U./dL   Nitrite, UA Negative    Leukocytes, UA Trace (A) Negative  Micro: WBC rare  RBC 1-3  Bact tr  Casts none  Epi few  Crystals: cal ox  UCx sent   Lab Results  Component Value Date   NA 139 12/07/2023   CL 102 12/07/2023   K 4.1 12/07/2023   CO2 27 12/07/2023   BUN 15 12/07/2023   CREATININE 1.50 12/07/2023   GFR 44.08 (L) 12/07/2023   CALCIUM  9.6 12/07/2023   ALBUMIN 4.6 12/07/2023   GLUCOSE 80 12/07/2023    Lab Results  Component Value Date   WBC 6.9 12/07/2023   HGB 16.4 12/07/2023   HCT 48.8 12/07/2023   MCV 88.8 12/07/2023   PLT 156.0 12/07/2023    Assessment & Plan:   Problem List Items Addressed This Visit     Dysuria - Primary   With nausea, malaise and urethral discharge noted over the past 2 weeks.  Denies risk factors for STI Suspicious for prostatitis.  Rx bactrim  DS 2 wk course to cover prostatitis  Update if recurrent or ongoing symptoms for change in abx treatment (doxycycline ).  Latest GFR 44 (11/2023) - CKD.       Relevant Orders   POCT Urinalysis Dipstick (Automated) (Completed)   Urine Culture   Other Visit Diagnoses       Abnormal penile discharge            Meds ordered this encounter  Medications   sulfamethoxazole -trimethoprim  (BACTRIM  DS) 800-160 MG tablet    Sig: Take 1 tablet by mouth 2 (two) times daily for 14 days.    Dispense:  28 tablet    Refill:  0    Orders Placed This Encounter  Procedures   Urine Culture   POCT Urinalysis Dipstick (Automated)    Patient Instructions  Take prolonged bactrim  course for 2 weeks to treat possible prostate infection.  Urine culture again sent off.  May try cranberry tablets 500mg  daily preventatively.  Ensure staying well hydrated with plenty of fluid. Avoid bladder irritants like dark sodas, caffeine, spicy foods.  Let us  know if ongoing or recurrent symptoms after finishing treatment.  Follow up  plan: No follow-ups on file.  Anton Blas, MD   "

## 2024-06-06 NOTE — Patient Instructions (Addendum)
 Take prolonged bactrim  course for 2 weeks to treat possible prostate infection.  Urine culture again sent off.  May try cranberry tablets 500mg  daily preventatively.  Ensure staying well hydrated with plenty of fluid. Avoid bladder irritants like dark sodas, caffeine, spicy foods.  Let us  know if ongoing or recurrent symptoms after finishing treatment.

## 2024-06-06 NOTE — Assessment & Plan Note (Addendum)
 With nausea, malaise and urethral discharge noted over the past 2 weeks.  Denies risk factors for STI Suspicious for prostatitis.  Rx bactrim  DS 2 wk course to cover prostatitis  Update if recurrent or ongoing symptoms for change in abx treatment (doxycycline ).  Latest GFR 44 (11/2023) - CKD.

## 2024-06-07 LAB — URINE CULTURE
MICRO NUMBER:: 17479277
SPECIMEN QUALITY:: ADEQUATE

## 2024-06-08 ENCOUNTER — Ambulatory Visit: Payer: Self-pay | Admitting: Family Medicine

## 2024-09-15 ENCOUNTER — Ambulatory Visit: Admitting: Dermatology

## 2024-12-02 ENCOUNTER — Ambulatory Visit
